# Patient Record
Sex: Female | Born: 1965 | Race: Black or African American | Hispanic: No | Smoking: Former smoker
Health system: Southern US, Community
[De-identification: ages and names within clinical notes are randomized; demographics above are authoritative.]

## PROBLEM LIST (undated history)

## (undated) DIAGNOSIS — Z720 Tobacco use: Secondary | ICD-10-CM

## (undated) DIAGNOSIS — I10 Essential (primary) hypertension: Secondary | ICD-10-CM

## (undated) DIAGNOSIS — E042 Nontoxic multinodular goiter: Secondary | ICD-10-CM

## (undated) DIAGNOSIS — E669 Obesity, unspecified: Secondary | ICD-10-CM

## (undated) DIAGNOSIS — F1011 Alcohol abuse, in remission: Secondary | ICD-10-CM

## (undated) DIAGNOSIS — S060X9A Concussion with loss of consciousness of unspecified duration, initial encounter: Secondary | ICD-10-CM

## (undated) DIAGNOSIS — F320 Major depressive disorder, single episode, mild: Secondary | ICD-10-CM

## (undated) DIAGNOSIS — S161XXD Strain of muscle, fascia and tendon at neck level, subsequent encounter: Secondary | ICD-10-CM

## (undated) DIAGNOSIS — F411 Generalized anxiety disorder: Secondary | ICD-10-CM

## (undated) DIAGNOSIS — A6000 Herpesviral infection of urogenital system, unspecified: Secondary | ICD-10-CM

## (undated) DIAGNOSIS — F319 Bipolar disorder, unspecified: Secondary | ICD-10-CM

## (undated) DIAGNOSIS — N644 Mastodynia: Secondary | ICD-10-CM

## (undated) DIAGNOSIS — K219 Gastro-esophageal reflux disease without esophagitis: Secondary | ICD-10-CM

## (undated) DIAGNOSIS — E785 Hyperlipidemia, unspecified: Secondary | ICD-10-CM

## (undated) DIAGNOSIS — D649 Anemia, unspecified: Secondary | ICD-10-CM

## (undated) DIAGNOSIS — F4310 Post-traumatic stress disorder, unspecified: Secondary | ICD-10-CM

## (undated) DIAGNOSIS — Z90710 Acquired absence of both cervix and uterus: Secondary | ICD-10-CM

## (undated) HISTORY — DX: Acquired absence of both cervix and uterus: Z90.710

## (undated) HISTORY — DX: Obesity, unspecified: E66.9

## (undated) HISTORY — DX: Mastodynia: N64.4

## (undated) HISTORY — DX: Gastro-esophageal reflux disease without esophagitis: K21.9

## (undated) HISTORY — DX: Strain of muscle, fascia and tendon at neck level, subsequent encounter: S16.1XXD

## (undated) HISTORY — DX: Herpesviral infection of urogenital system, unspecified: A60.00

## (undated) HISTORY — PX: WISDOM TOOTH EXTRACTION: SHX21

## (undated) HISTORY — DX: Nontoxic multinodular goiter: E04.2

## (undated) HISTORY — DX: Essential (primary) hypertension: I10

## (undated) HISTORY — DX: Major depressive disorder, single episode, mild: F32.0

## (undated) HISTORY — DX: Hyperlipidemia, unspecified: E78.5

## (undated) HISTORY — PX: CERVICAL CERCLAGE: SHX1329

## (undated) HISTORY — DX: Tobacco use: Z72.0

---

## 2003-04-29 ENCOUNTER — Emergency Department (HOSPITAL_COMMUNITY): Admission: EM | Admit: 2003-04-29 | Discharge: 2003-04-29 | Payer: Self-pay | Admitting: Emergency Medicine

## 2003-05-09 ENCOUNTER — Emergency Department (HOSPITAL_COMMUNITY): Admission: EM | Admit: 2003-05-09 | Discharge: 2003-05-10 | Payer: Self-pay | Admitting: Emergency Medicine

## 2003-10-08 ENCOUNTER — Inpatient Hospital Stay (HOSPITAL_COMMUNITY): Admission: AD | Admit: 2003-10-08 | Discharge: 2003-10-08 | Payer: Self-pay | Admitting: Family Medicine

## 2003-11-10 ENCOUNTER — Ambulatory Visit: Payer: Self-pay | Admitting: *Deleted

## 2003-11-10 ENCOUNTER — Ambulatory Visit (HOSPITAL_COMMUNITY): Admission: RE | Admit: 2003-11-10 | Discharge: 2003-11-10 | Payer: Self-pay | Admitting: *Deleted

## 2003-11-24 ENCOUNTER — Ambulatory Visit: Payer: Self-pay | Admitting: Obstetrics & Gynecology

## 2003-11-26 ENCOUNTER — Ambulatory Visit (HOSPITAL_COMMUNITY): Admission: RE | Admit: 2003-11-26 | Discharge: 2003-11-26 | Payer: Self-pay | Admitting: *Deleted

## 2003-12-15 ENCOUNTER — Ambulatory Visit: Payer: Self-pay | Admitting: Family Medicine

## 2003-12-15 ENCOUNTER — Ambulatory Visit (HOSPITAL_COMMUNITY): Admission: RE | Admit: 2003-12-15 | Discharge: 2003-12-15 | Payer: Self-pay | Admitting: *Deleted

## 2003-12-16 ENCOUNTER — Inpatient Hospital Stay (HOSPITAL_COMMUNITY): Admission: AD | Admit: 2003-12-16 | Discharge: 2003-12-18 | Payer: Self-pay | Admitting: Family Medicine

## 2003-12-16 ENCOUNTER — Ambulatory Visit: Payer: Self-pay | Admitting: Family Medicine

## 2003-12-16 ENCOUNTER — Inpatient Hospital Stay (HOSPITAL_COMMUNITY): Admission: AD | Admit: 2003-12-16 | Discharge: 2003-12-16 | Payer: Self-pay | Admitting: *Deleted

## 2003-12-23 ENCOUNTER — Inpatient Hospital Stay (HOSPITAL_COMMUNITY): Admission: AD | Admit: 2003-12-23 | Discharge: 2003-12-23 | Payer: Self-pay | Admitting: Obstetrics & Gynecology

## 2003-12-29 ENCOUNTER — Ambulatory Visit: Payer: Self-pay | Admitting: Family Medicine

## 2004-01-05 ENCOUNTER — Ambulatory Visit: Payer: Self-pay | Admitting: *Deleted

## 2004-01-12 ENCOUNTER — Ambulatory Visit: Payer: Self-pay | Admitting: *Deleted

## 2004-01-26 ENCOUNTER — Ambulatory Visit: Payer: Self-pay | Admitting: *Deleted

## 2004-02-09 ENCOUNTER — Ambulatory Visit: Payer: Self-pay | Admitting: Obstetrics & Gynecology

## 2004-02-17 ENCOUNTER — Inpatient Hospital Stay (HOSPITAL_COMMUNITY): Admission: AD | Admit: 2004-02-17 | Discharge: 2004-02-17 | Payer: Self-pay | Admitting: Obstetrics & Gynecology

## 2004-02-24 ENCOUNTER — Ambulatory Visit: Payer: Self-pay | Admitting: Family Medicine

## 2004-02-29 ENCOUNTER — Ambulatory Visit: Payer: Self-pay | Admitting: *Deleted

## 2004-02-29 ENCOUNTER — Ambulatory Visit (HOSPITAL_COMMUNITY): Admission: RE | Admit: 2004-02-29 | Discharge: 2004-02-29 | Payer: Self-pay | Admitting: *Deleted

## 2004-03-08 ENCOUNTER — Ambulatory Visit: Payer: Self-pay | Admitting: *Deleted

## 2004-03-15 ENCOUNTER — Ambulatory Visit: Payer: Self-pay | Admitting: *Deleted

## 2004-03-22 ENCOUNTER — Ambulatory Visit: Payer: Self-pay | Admitting: *Deleted

## 2004-03-22 ENCOUNTER — Ambulatory Visit (HOSPITAL_COMMUNITY): Admission: RE | Admit: 2004-03-22 | Discharge: 2004-03-22 | Payer: Self-pay | Admitting: *Deleted

## 2004-04-05 ENCOUNTER — Ambulatory Visit: Payer: Self-pay | Admitting: *Deleted

## 2004-04-19 ENCOUNTER — Ambulatory Visit: Payer: Self-pay | Admitting: *Deleted

## 2004-04-19 ENCOUNTER — Inpatient Hospital Stay (HOSPITAL_COMMUNITY): Admission: AD | Admit: 2004-04-19 | Discharge: 2004-04-19 | Payer: Self-pay | Admitting: Obstetrics and Gynecology

## 2004-04-20 ENCOUNTER — Inpatient Hospital Stay (HOSPITAL_COMMUNITY): Admission: AD | Admit: 2004-04-20 | Discharge: 2004-04-23 | Payer: Self-pay | Admitting: Obstetrics & Gynecology

## 2004-04-21 ENCOUNTER — Ambulatory Visit: Payer: Self-pay | Admitting: Family Medicine

## 2004-06-08 ENCOUNTER — Ambulatory Visit: Payer: Self-pay | Admitting: Family Medicine

## 2004-07-13 ENCOUNTER — Ambulatory Visit: Payer: Self-pay | Admitting: Family Medicine

## 2004-07-13 ENCOUNTER — Other Ambulatory Visit: Admission: RE | Admit: 2004-07-13 | Discharge: 2004-07-13 | Payer: Self-pay | Admitting: Family Medicine

## 2004-07-28 ENCOUNTER — Ambulatory Visit: Payer: Self-pay | Admitting: Family Medicine

## 2004-07-31 ENCOUNTER — Encounter: Admission: RE | Admit: 2004-07-31 | Discharge: 2004-07-31 | Payer: Self-pay | Admitting: Family Medicine

## 2004-08-01 ENCOUNTER — Ambulatory Visit: Payer: Self-pay | Admitting: Family Medicine

## 2004-08-30 ENCOUNTER — Ambulatory Visit: Payer: Self-pay | Admitting: Family Medicine

## 2004-09-07 ENCOUNTER — Ambulatory Visit: Payer: Self-pay | Admitting: Family Medicine

## 2004-09-26 ENCOUNTER — Ambulatory Visit: Payer: Self-pay | Admitting: Sports Medicine

## 2004-10-20 ENCOUNTER — Ambulatory Visit: Payer: Self-pay | Admitting: Family Medicine

## 2004-10-23 ENCOUNTER — Ambulatory Visit: Payer: Self-pay | Admitting: Family Medicine

## 2004-10-30 ENCOUNTER — Ambulatory Visit: Payer: Self-pay | Admitting: Family Medicine

## 2004-11-06 ENCOUNTER — Ambulatory Visit: Payer: Self-pay | Admitting: Family Medicine

## 2004-11-21 ENCOUNTER — Emergency Department (HOSPITAL_COMMUNITY): Admission: EM | Admit: 2004-11-21 | Discharge: 2004-11-21 | Payer: Self-pay | Admitting: Family Medicine

## 2004-12-11 ENCOUNTER — Ambulatory Visit: Payer: Self-pay | Admitting: Family Medicine

## 2005-01-26 ENCOUNTER — Ambulatory Visit: Payer: Self-pay | Admitting: Family Medicine

## 2005-03-06 ENCOUNTER — Ambulatory Visit: Payer: Self-pay | Admitting: Sports Medicine

## 2005-04-16 ENCOUNTER — Ambulatory Visit: Payer: Self-pay | Admitting: Family Medicine

## 2005-06-22 ENCOUNTER — Encounter (INDEPENDENT_AMBULATORY_CARE_PROVIDER_SITE_OTHER): Payer: Self-pay | Admitting: *Deleted

## 2005-06-22 LAB — CONVERTED CEMR LAB

## 2005-07-17 ENCOUNTER — Ambulatory Visit: Payer: Self-pay | Admitting: Family Medicine

## 2005-07-17 ENCOUNTER — Other Ambulatory Visit: Admission: RE | Admit: 2005-07-17 | Discharge: 2005-07-17 | Payer: Self-pay | Admitting: Family Medicine

## 2005-09-13 ENCOUNTER — Ambulatory Visit: Payer: Self-pay | Admitting: Family Medicine

## 2005-12-04 ENCOUNTER — Ambulatory Visit: Payer: Self-pay | Admitting: Family Medicine

## 2005-12-12 ENCOUNTER — Encounter: Admission: RE | Admit: 2005-12-12 | Discharge: 2005-12-12 | Payer: Self-pay | Admitting: Sports Medicine

## 2005-12-25 ENCOUNTER — Encounter: Admission: RE | Admit: 2005-12-25 | Discharge: 2005-12-25 | Payer: Self-pay | Admitting: Sports Medicine

## 2006-01-03 ENCOUNTER — Ambulatory Visit: Payer: Self-pay | Admitting: Family Medicine

## 2006-01-31 ENCOUNTER — Ambulatory Visit: Payer: Self-pay

## 2006-01-31 ENCOUNTER — Encounter: Admission: RE | Admit: 2006-01-31 | Discharge: 2006-05-01 | Payer: Self-pay | Admitting: Family Medicine

## 2006-03-21 DIAGNOSIS — M5382 Other specified dorsopathies, cervical region: Secondary | ICD-10-CM | POA: Insufficient documentation

## 2006-03-21 DIAGNOSIS — E669 Obesity, unspecified: Secondary | ICD-10-CM

## 2006-03-21 HISTORY — DX: Obesity, unspecified: E66.9

## 2006-03-22 ENCOUNTER — Encounter (INDEPENDENT_AMBULATORY_CARE_PROVIDER_SITE_OTHER): Payer: Self-pay | Admitting: *Deleted

## 2006-04-11 ENCOUNTER — Telehealth: Payer: Self-pay | Admitting: *Deleted

## 2006-05-03 ENCOUNTER — Ambulatory Visit: Payer: Self-pay | Admitting: Family Medicine

## 2006-05-03 ENCOUNTER — Telehealth: Payer: Self-pay | Admitting: *Deleted

## 2006-05-03 ENCOUNTER — Encounter (INDEPENDENT_AMBULATORY_CARE_PROVIDER_SITE_OTHER): Payer: Self-pay | Admitting: Family Medicine

## 2006-05-03 ENCOUNTER — Other Ambulatory Visit: Admission: RE | Admit: 2006-05-03 | Discharge: 2006-05-03 | Payer: Self-pay | Admitting: Family Medicine

## 2006-05-03 LAB — CONVERTED CEMR LAB
Chlamydia, DNA Probe: NEGATIVE
GC Probe Amp, Genital: NEGATIVE

## 2006-05-06 ENCOUNTER — Encounter (INDEPENDENT_AMBULATORY_CARE_PROVIDER_SITE_OTHER): Payer: Self-pay | Admitting: Family Medicine

## 2006-05-08 ENCOUNTER — Encounter (INDEPENDENT_AMBULATORY_CARE_PROVIDER_SITE_OTHER): Payer: Self-pay | Admitting: Family Medicine

## 2006-05-29 ENCOUNTER — Ambulatory Visit: Payer: Self-pay | Admitting: Family Medicine

## 2006-06-05 ENCOUNTER — Telehealth: Payer: Self-pay | Admitting: *Deleted

## 2006-06-05 ENCOUNTER — Ambulatory Visit: Payer: Self-pay | Admitting: Family Medicine

## 2006-06-21 ENCOUNTER — Ambulatory Visit: Payer: Self-pay | Admitting: Family Medicine

## 2006-06-24 ENCOUNTER — Telehealth (INDEPENDENT_AMBULATORY_CARE_PROVIDER_SITE_OTHER): Payer: Self-pay | Admitting: Family Medicine

## 2006-06-24 ENCOUNTER — Encounter (INDEPENDENT_AMBULATORY_CARE_PROVIDER_SITE_OTHER): Payer: Self-pay | Admitting: Family Medicine

## 2006-07-12 ENCOUNTER — Ambulatory Visit: Payer: Self-pay | Admitting: Family Medicine

## 2006-07-15 ENCOUNTER — Ambulatory Visit: Payer: Self-pay | Admitting: Family Medicine

## 2006-09-10 ENCOUNTER — Encounter (INDEPENDENT_AMBULATORY_CARE_PROVIDER_SITE_OTHER): Payer: Self-pay | Admitting: Family Medicine

## 2006-09-10 ENCOUNTER — Ambulatory Visit: Payer: Self-pay | Admitting: Family Medicine

## 2006-09-10 LAB — CONVERTED CEMR LAB
GC Probe Amp, Genital: NEGATIVE
Whiff Test: POSITIVE

## 2006-09-11 ENCOUNTER — Encounter (INDEPENDENT_AMBULATORY_CARE_PROVIDER_SITE_OTHER): Payer: Self-pay | Admitting: Family Medicine

## 2006-10-30 ENCOUNTER — Ambulatory Visit: Payer: Self-pay | Admitting: Family Medicine

## 2006-10-30 ENCOUNTER — Telehealth (INDEPENDENT_AMBULATORY_CARE_PROVIDER_SITE_OTHER): Payer: Self-pay | Admitting: *Deleted

## 2006-11-25 ENCOUNTER — Encounter (INDEPENDENT_AMBULATORY_CARE_PROVIDER_SITE_OTHER): Payer: Self-pay | Admitting: Family Medicine

## 2007-01-21 ENCOUNTER — Telehealth (INDEPENDENT_AMBULATORY_CARE_PROVIDER_SITE_OTHER): Payer: Self-pay | Admitting: *Deleted

## 2007-01-25 ENCOUNTER — Emergency Department (HOSPITAL_COMMUNITY): Admission: EM | Admit: 2007-01-25 | Discharge: 2007-01-26 | Payer: Self-pay | Admitting: Emergency Medicine

## 2007-01-27 ENCOUNTER — Telehealth (INDEPENDENT_AMBULATORY_CARE_PROVIDER_SITE_OTHER): Payer: Self-pay | Admitting: Family Medicine

## 2007-01-30 ENCOUNTER — Ambulatory Visit: Payer: Self-pay

## 2007-01-30 ENCOUNTER — Encounter (INDEPENDENT_AMBULATORY_CARE_PROVIDER_SITE_OTHER): Payer: Self-pay | Admitting: Family Medicine

## 2007-01-30 LAB — CONVERTED CEMR LAB
Beta hcg, urine, semiquantitative: NEGATIVE
Hemoglobin: 12.6 g/dL (ref 12.0–15.0)
LH: 12.4 milliintl units/mL
MCHC: 32.4 g/dL (ref 30.0–36.0)
MCV: 81.9 fL (ref 78.0–100.0)
RBC: 4.75 M/uL (ref 3.87–5.11)
RDW: 14.1 % (ref 11.5–15.5)
TSH: 2.229 microintl units/mL (ref 0.350–5.50)

## 2007-02-06 ENCOUNTER — Telehealth: Payer: Self-pay | Admitting: *Deleted

## 2007-02-18 ENCOUNTER — Encounter (INDEPENDENT_AMBULATORY_CARE_PROVIDER_SITE_OTHER): Payer: Self-pay | Admitting: Family Medicine

## 2007-03-04 ENCOUNTER — Telehealth (INDEPENDENT_AMBULATORY_CARE_PROVIDER_SITE_OTHER): Payer: Self-pay | Admitting: Family Medicine

## 2007-03-07 ENCOUNTER — Telehealth: Payer: Self-pay | Admitting: *Deleted

## 2007-03-18 ENCOUNTER — Ambulatory Visit: Payer: Self-pay | Admitting: Family Medicine

## 2007-03-19 ENCOUNTER — Encounter: Admission: RE | Admit: 2007-03-19 | Discharge: 2007-03-19 | Payer: Self-pay | Admitting: Family Medicine

## 2007-03-19 ENCOUNTER — Telehealth: Payer: Self-pay | Admitting: *Deleted

## 2007-03-25 ENCOUNTER — Encounter: Payer: Self-pay | Admitting: Family Medicine

## 2007-03-31 ENCOUNTER — Ambulatory Visit: Payer: Self-pay | Admitting: Sports Medicine

## 2007-03-31 ENCOUNTER — Encounter (INDEPENDENT_AMBULATORY_CARE_PROVIDER_SITE_OTHER): Payer: Self-pay | Admitting: *Deleted

## 2007-04-08 ENCOUNTER — Encounter (INDEPENDENT_AMBULATORY_CARE_PROVIDER_SITE_OTHER): Payer: Self-pay | Admitting: *Deleted

## 2007-05-25 ENCOUNTER — Emergency Department (HOSPITAL_COMMUNITY): Admission: EM | Admit: 2007-05-25 | Discharge: 2007-05-25 | Payer: Self-pay | Admitting: Emergency Medicine

## 2007-05-25 ENCOUNTER — Telehealth: Payer: Self-pay | Admitting: Family Medicine

## 2007-05-26 ENCOUNTER — Ambulatory Visit: Payer: Self-pay | Admitting: Family Medicine

## 2007-05-26 ENCOUNTER — Encounter (INDEPENDENT_AMBULATORY_CARE_PROVIDER_SITE_OTHER): Payer: Self-pay | Admitting: Family Medicine

## 2007-05-26 LAB — CONVERTED CEMR LAB
CO2: 21 meq/L (ref 19–32)
Calcium: 8.9 mg/dL (ref 8.4–10.5)
Creatinine, Ser: 1.15 mg/dL (ref 0.40–1.20)
Glucose, Bld: 101 mg/dL — ABNORMAL HIGH (ref 70–99)

## 2007-05-27 ENCOUNTER — Encounter (INDEPENDENT_AMBULATORY_CARE_PROVIDER_SITE_OTHER): Payer: Self-pay | Admitting: Family Medicine

## 2007-05-27 ENCOUNTER — Telehealth (INDEPENDENT_AMBULATORY_CARE_PROVIDER_SITE_OTHER): Payer: Self-pay | Admitting: *Deleted

## 2007-06-17 ENCOUNTER — Emergency Department (HOSPITAL_COMMUNITY): Admission: EM | Admit: 2007-06-17 | Discharge: 2007-06-17 | Payer: Self-pay | Admitting: Family Medicine

## 2007-07-18 ENCOUNTER — Encounter (INDEPENDENT_AMBULATORY_CARE_PROVIDER_SITE_OTHER): Payer: Self-pay | Admitting: Family Medicine

## 2007-07-21 ENCOUNTER — Telehealth (INDEPENDENT_AMBULATORY_CARE_PROVIDER_SITE_OTHER): Payer: Self-pay | Admitting: Family Medicine

## 2007-07-24 ENCOUNTER — Telehealth (INDEPENDENT_AMBULATORY_CARE_PROVIDER_SITE_OTHER): Payer: Self-pay | Admitting: Family Medicine

## 2007-07-29 ENCOUNTER — Encounter: Payer: Self-pay | Admitting: Endocrinology

## 2007-08-04 ENCOUNTER — Encounter: Payer: Self-pay | Admitting: Endocrinology

## 2007-08-05 ENCOUNTER — Ambulatory Visit: Payer: Self-pay | Admitting: Endocrinology

## 2007-09-04 ENCOUNTER — Encounter: Payer: Self-pay | Admitting: Endocrinology

## 2007-09-04 ENCOUNTER — Other Ambulatory Visit: Admission: RE | Admit: 2007-09-04 | Discharge: 2007-09-04 | Payer: Self-pay | Admitting: Diagnostic Radiology

## 2007-09-04 ENCOUNTER — Encounter (INDEPENDENT_AMBULATORY_CARE_PROVIDER_SITE_OTHER): Payer: Self-pay | Admitting: Diagnostic Radiology

## 2007-09-04 ENCOUNTER — Encounter: Admission: RE | Admit: 2007-09-04 | Discharge: 2007-09-04 | Payer: Self-pay | Admitting: Endocrinology

## 2007-09-05 DIAGNOSIS — E042 Nontoxic multinodular goiter: Secondary | ICD-10-CM

## 2007-09-05 HISTORY — DX: Nontoxic multinodular goiter: E04.2

## 2007-09-25 ENCOUNTER — Ambulatory Visit: Payer: Self-pay | Admitting: Endocrinology

## 2007-12-10 ENCOUNTER — Ambulatory Visit: Payer: Self-pay | Admitting: Family Medicine

## 2007-12-10 ENCOUNTER — Telehealth: Payer: Self-pay | Admitting: *Deleted

## 2007-12-10 ENCOUNTER — Encounter: Payer: Self-pay | Admitting: Family Medicine

## 2007-12-12 ENCOUNTER — Telehealth: Payer: Self-pay | Admitting: *Deleted

## 2008-06-14 ENCOUNTER — Encounter (INDEPENDENT_AMBULATORY_CARE_PROVIDER_SITE_OTHER): Payer: Self-pay | Admitting: Family Medicine

## 2008-08-11 ENCOUNTER — Encounter: Payer: Self-pay | Admitting: Family Medicine

## 2008-08-11 ENCOUNTER — Ambulatory Visit: Payer: Self-pay | Admitting: Family Medicine

## 2008-08-11 DIAGNOSIS — Z9189 Other specified personal risk factors, not elsewhere classified: Secondary | ICD-10-CM | POA: Insufficient documentation

## 2008-08-23 ENCOUNTER — Ambulatory Visit: Payer: Self-pay | Admitting: Family Medicine

## 2008-08-23 ENCOUNTER — Encounter: Payer: Self-pay | Admitting: Family Medicine

## 2008-08-27 ENCOUNTER — Encounter: Payer: Self-pay | Admitting: Family Medicine

## 2008-08-27 LAB — CONVERTED CEMR LAB
Albumin: 3.8 g/dL (ref 3.5–5.2)
BUN: 10 mg/dL (ref 6–23)
Calcium: 9 mg/dL (ref 8.4–10.5)
Chloride: 105 meq/L (ref 96–112)
Cholesterol: 168 mg/dL (ref 0–200)
Glucose, Bld: 86 mg/dL (ref 70–99)
HDL: 39 mg/dL — ABNORMAL LOW (ref >39)
Potassium: 4.5 meq/L (ref 3.5–5.3)
Triglycerides: 90 mg/dL (ref <150)

## 2008-09-24 ENCOUNTER — Emergency Department (HOSPITAL_COMMUNITY): Admission: EM | Admit: 2008-09-24 | Discharge: 2008-09-24 | Payer: Self-pay | Admitting: Family Medicine

## 2008-09-24 ENCOUNTER — Emergency Department (HOSPITAL_COMMUNITY): Admission: EM | Admit: 2008-09-24 | Discharge: 2008-09-24 | Payer: Self-pay | Admitting: Emergency Medicine

## 2008-11-15 ENCOUNTER — Telehealth: Payer: Self-pay | Admitting: Family Medicine

## 2008-11-20 ENCOUNTER — Emergency Department (HOSPITAL_COMMUNITY): Admission: EM | Admit: 2008-11-20 | Discharge: 2008-11-20 | Payer: Self-pay | Admitting: Emergency Medicine

## 2008-11-25 ENCOUNTER — Encounter: Payer: Self-pay | Admitting: Family Medicine

## 2008-11-26 ENCOUNTER — Ambulatory Visit: Payer: Self-pay | Admitting: Family Medicine

## 2008-11-26 ENCOUNTER — Encounter: Payer: Self-pay | Admitting: Family Medicine

## 2008-11-26 LAB — CONVERTED CEMR LAB
Bilirubin Urine: NEGATIVE
Chlamydia, DNA Probe: NEGATIVE
Ketones, urine, test strip: NEGATIVE
Protein, U semiquant: NEGATIVE
Urobilinogen, UA: 0.2

## 2009-05-12 ENCOUNTER — Telehealth: Payer: Self-pay | Admitting: Family Medicine

## 2009-06-29 ENCOUNTER — Ambulatory Visit: Payer: Self-pay | Admitting: Family Medicine

## 2009-06-29 ENCOUNTER — Other Ambulatory Visit: Admission: RE | Admit: 2009-06-29 | Discharge: 2009-06-29 | Payer: Self-pay | Admitting: Family Medicine

## 2009-06-29 ENCOUNTER — Encounter: Payer: Self-pay | Admitting: Family Medicine

## 2009-06-29 LAB — CONVERTED CEMR LAB: Whiff Test: POSITIVE

## 2009-06-30 LAB — CONVERTED CEMR LAB: Hepatitis B Surface Ag: NEGATIVE

## 2009-07-01 ENCOUNTER — Ambulatory Visit (HOSPITAL_COMMUNITY): Admission: RE | Admit: 2009-07-01 | Discharge: 2009-07-01 | Payer: Self-pay | Admitting: Family Medicine

## 2009-07-01 ENCOUNTER — Telehealth: Payer: Self-pay | Admitting: *Deleted

## 2009-07-01 LAB — CONVERTED CEMR LAB: Pap Smear: NEGATIVE

## 2010-02-21 NOTE — Progress Notes (Signed)
Summary: Rx Req: Acyclovir Rx  Phone Note Call from Patient Call back at [phone]   Caller: Patient Summary of Call: Pt wants the 4.00 plan at [facility] Acyclovir 200 mg capsules.  [facility] [address] in [place].  Pt uses to have Medicaid and now does not.   Initial call taken by: Clydell Hakim,  May 12, 2009 4:23 PM    New/Updated Medications: ACYCLOVIR 200 MG CAPS (ACYCLOVIR) take 4 pills (800 mg) at one time every 12 hours for 5 days. Prescriptions: ACYCLOVIR 200 MG CAPS (ACYCLOVIR) take 4 pills (800 mg) at one time every 12 hours for 5 days.  #40 x 4   Entered and Authorized by:   Jamie Brookes MD   Signed by:   Jamie Brookes MD on 05/12/2009   Method used:   Electronically to        [facility]. [phone]* (retail)       [address]       [place], [place]  [address]       Ph: [phone]       Fax: [phone]   RxID:   [id number]
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
## 2010-02-21 NOTE — Progress Notes (Signed)
Summary: results  Phone Note Call from Patient Call back at Home Phone [phone]   Caller: Patient Summary of Call: pt is wanting copy of [place] results w/ comparisons from last time  Initial call taken by: De Nurse,  July 01, 2009 2:35 PM  Follow-up for Phone Call        no problem, you can print out the report and it has a written comparison in it so she is free to pick it up anything.  Follow-up by: Jamie Brookes MD,  July 01, 2009 2:44 PM  Additional Follow-up for Phone Call Additional follow up Details #1::        Results placed up front and pt informed. Additional Follow-up by: Jone Baseman CMA,  July 01, 2009 3:38 PM

## 2010-04-27 LAB — URINE MICROSCOPIC-ADD ON

## 2010-04-27 LAB — URINALYSIS, ROUTINE W REFLEX MICROSCOPIC
Ketones, ur: NEGATIVE mg/dL
Nitrite: NEGATIVE
Protein, ur: NEGATIVE mg/dL
Urobilinogen, UA: 1 mg/dL (ref 0.0–1.0)
pH: 6 (ref 5.0–8.0)

## 2010-06-09 NOTE — Op Note (Signed)
NAME:  Tracy Rose, BOYUM NO.:  [id number]   MEDICAL RECORD NO.:  [id number]          PATIENT TYPE:  WOC   LOCATION:  WOC                          FACILITY:  [facility]   PHYSICIAN:  Lesly Dukes, M.D. DATE OF BIRTH:  Nov 17, 1965   DATE OF PROCEDURE:  12/17/2003  DATE OF DISCHARGE:                                 OPERATIVE REPORT   PREOPERATIVE DIAGNOSIS:  A 45 year old para 0-1-0-0 at 18 weeks and three  days estimated gestational age with history of cervical incompetence with  last pregnancy and current cervical exam is 2 cm with funneling.   POSTOPERATIVE DIAGNOSIS:  A 45 year old para 0-1-0-0 at 18 weeks and three  days estimated gestational age with history of cervical incompetence with  last pregnancy and current cervical exam is 2 cm with funneling.   OPERATION PERFORMED:  [employer] cerclage.   SURGEON:  Lesly Dukes, M.D.   ANESTHESIA:  Spinal.   ESTIMATED BLOOD LOSS:  100 mL.   COMPLICATIONS:  None.   FINDINGS:  A 2 cm cervix and closed prior to and after procedure.   DESCRIPTION OF PROCEDURE:  After informed consent was obtained, the patient  was taken to the operating room where spinal anesthesia was found to be  adequate.  The patient was placed in dorsal lithotomy position and prepped  and draped in the normal sterile fashion.  A Foley catheter was placed in  the bladder.  Retractors were placed in the vagina and the cervix was  brought into view.  The cervix was grasped with sponge stick and a 0 Prolene  suture was used to tie the cervix shut in a circumferential fashion.  The  knot was tied at 12 o'clock.  There is a tag on the knot to aid in  visualization.  There was good hemostasis after procedure.  Digital exam  after the procedure revealed a 2 cm closed cervix.  Foley catheter was left  in the bladder and the patient was taken to the recovery room in stable  condition.  All counts were correct.      KHL/MEDQ  D:  12/17/2003   T:  12/17/2003  Job:  [id number]

## 2010-06-09 NOTE — Discharge Summary (Signed)
Tracy Rose, JIMERSON NO.:  [id number]   MEDICAL RECORD NO.:  [id number]          PATIENT TYPE:  WOC   LOCATION:  WOC                          FACILITY:  [facility]   PHYSICIAN:  Jon Gills, M.D.  DATE OF BIRTH:  Sep 21, 1965   DATE OF ADMISSION:  12/16/2003  DATE OF DISCHARGE:  12/18/2003                                 DISCHARGE SUMMARY   ATTENDING PHYSICIAN:  Tinnie Gens, M.D.   ADMITTING DIAGNOSES:  70.  A 45 year old G2, P0-1-0-0 at 76 and 4 weeks with cervical incompetence.  2.  History of 20 week preterm delivery with painless dilation.  3.  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  4.  Positive bacterial vaginosis on wet prep.   DISCHARGE DIAGNOSES:  95.  A 44 year old G2, P0-1-0-0 at 8 and 5 weeks, status post cerclage.  2.  Positive fetal heart rate by Doppler.  3.  Treated bacterial vaginosis.   DISCHARGE MEDICATIONS:  1.  Tylenol 650 mg q.4h. p.r.n.  2.  Prenatal vitamin one p.o. daily.   ADMISSION HISTORY:  Ms. Cutsforth was seen at [facility] at 19 and 3  weeks with cervical effacement of 50% and a closed cervix.  On serial  ultrasounds her cervix gradually shortened from 3.5 and 3 cm to 2.3 cm.  With her history of delivery at 20 weeks, the patient was decided that a  cerclage was necessary.  At that time, the patient refused to be admitted  but agreed to be admitted 24 hours following the visit.   The patient was admitted at 18 and 4 weeks and the cerclage was placed at  that time.  The patient had no complications overnight and on the day  following hospitalization, was stable for discharge.  Her cervix exam on the  day of discharge was closed and felt about 2-3 cm in length.  The cerclage  was well in place.   CONDITION ON DISCHARGE:  The patient was discharged to home in stable  condition.   INSTRUCTIONS TO PATIENT:  The patient was told of the above medical regimen.  She should follow up with [facility].  She is  given preterm labor  precautions.     LC/MEDQ  D:  12/18/2003  T:  12/18/2003  Job:  [id number]   cc:   [facility]
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
## 2010-06-09 NOTE — Discharge Summary (Signed)
NAME:  Tracy Rose, Tracy Rose NO.:  [id number]   MEDICAL RECORD NO.:  [id number]           PATIENT TYPE:   LOCATION:                                 FACILITY:   PHYSICIAN:  Jon Gills, M.D.       DATE OF BIRTH:   DATE OF ADMISSION:  DATE OF DISCHARGE:                                 DISCHARGE SUMMARY   ADDENDUM   The patient was placed on Indocin and Unasyn on admission.  The Unasyn was  then changed to ampicillin and Flagyl IV for the bacterial vaginosis.  Bacterial vaginosis has been properly treated during her hospitalization.            ___________________________________________  Jon Gills, M.D.    LC/MEDQ  D:  12/18/2003  T:  12/18/2003  Job:  [id number]

## 2010-09-08 ENCOUNTER — Ambulatory Visit (INDEPENDENT_AMBULATORY_CARE_PROVIDER_SITE_OTHER): Payer: Self-pay | Admitting: Family Medicine

## 2010-09-08 ENCOUNTER — Encounter: Payer: Self-pay | Admitting: Family Medicine

## 2010-09-08 VITALS — BP 109/85 | HR 97 | Temp 97.9°F | Ht 65.75 in | Wt 253.1 lb

## 2010-09-08 DIAGNOSIS — Z720 Tobacco use: Secondary | ICD-10-CM | POA: Insufficient documentation

## 2010-09-08 DIAGNOSIS — Z01419 Encounter for gynecological examination (general) (routine) without abnormal findings: Secondary | ICD-10-CM

## 2010-09-08 DIAGNOSIS — F17200 Nicotine dependence, unspecified, uncomplicated: Secondary | ICD-10-CM

## 2010-09-08 DIAGNOSIS — E042 Nontoxic multinodular goiter: Secondary | ICD-10-CM

## 2010-09-08 DIAGNOSIS — E669 Obesity, unspecified: Secondary | ICD-10-CM

## 2010-09-08 HISTORY — DX: Tobacco use: Z72.0

## 2010-09-08 NOTE — Progress Notes (Signed)
Addended by: Macy Mis on: 09/08/2010 12:51 PM   Modules accepted: Orders

## 2010-09-08 NOTE — Assessment & Plan Note (Addendum)
Last [place] June 2011 shows no significant change.  I am unsure of optimal surveillance.  Patient to follow-up with her endocrinologist.

## 2010-09-08 NOTE — Progress Notes (Signed)
  Subjective:    Patient ID: Tracy Rose, female    DOB: 1965/04/19, 45 y.o.   MRN: [id number]  HPI Here for annual physical. No complaints  Hx of multinodular goiter.  Has not had endocrine follow-up in several years.  Has ultrasound last year with PCP showing so significant change.  Annual Gynecological Exam   Wt Readings from Last 3 Encounters:  09/08/10 253 lb 1.6 oz (114.805 kg)  06/29/09 237 lb 9.6 oz (107.775 kg)  11/26/08 221 lb (100.245 kg)   Last period: 09/03/2010 Regular periods: yes Heavy bleeding: no  Sexually active: no Birth control or hormonal therapy:no Hx of STD: Patient desires STD screening Dyspareunia: No Hot flashes: No Vaginal discharge: no Dysuria:No   Last mammogram: never Breast mass or concerns: No Last Pap: last year  History of abnormal pap: No   FH of breast, uterine, ovarian, colon cancer: yes    Review of Systems    Gen:  No fever, chills, unexplained weight loss Ears:  No hearing loss, ringing Eyes: No vision changes, double vision, eye drainage Nose:  No rhinorrhea, congestion Throat:  No sore throat or dysphagia CV:  No chest pain, palpitations, PND, dyspnea on exertion, or edema Resp: No cough, dyspnea, wheezing Abd: No nausea, vomting, diarrhea, constipation, or change in bowel color, size, or caliber. MSK: no joint pain, myalgias SKIN: no rash, changing moles GU: No dysuria, hematuria, vaginal discharge Neuro:  No headache, numbness, weakness, tingling, syncope.   Objective:   Physical Exam  GEN: Alert & Oriented, No acute distress CV:  Regular Rate & Rhythm, no murmur Neck:  No nodules appreciated.  Thyroid mildly enlarged. Respiratory:  Normal work of breathing, CTAB Abd:  + BS, soft, no tenderness to palpation Ext: no pre-tibial edema Breasts: pendulous, no masses, skin changes. Pelvic Exam:        External: normal female genitalia without lesions or masses        Vagina: normal without lesions or masses       Cervix: normal without lesions or masses        Adnexa: normal bimanual exam without masses or fullness        Uterus: normal by palpation           Assessment & Plan:

## 2010-09-08 NOTE — Assessment & Plan Note (Signed)
Gave info on quit line, and availability of further smoking cessation counseling and discussion on available therapies.

## 2010-09-08 NOTE — Patient Instructions (Addendum)
1-800-quit now Make appointment for smoking cessation with Dr. Raymondo Band.  Dr. Everardo All [address] [place], [place] [address] Location Phone: [phone]

## 2010-09-08 NOTE — Assessment & Plan Note (Signed)
Gave her info to start exercise, discuss dietary changes when we addressed them with her 45 year old.  Invited her to follow-up to discuss nutrition interventions in further detail.

## 2010-09-14 ENCOUNTER — Encounter: Payer: Self-pay | Admitting: Family Medicine

## 2011-01-25 ENCOUNTER — Encounter: Payer: Self-pay | Admitting: Family Medicine

## 2011-01-25 ENCOUNTER — Ambulatory Visit (INDEPENDENT_AMBULATORY_CARE_PROVIDER_SITE_OTHER): Payer: Medicaid Other | Admitting: Family Medicine

## 2011-01-25 VITALS — BP 121/84 | HR 80 | Temp 97.6°F | Ht 66.0 in | Wt 272.0 lb

## 2011-01-25 DIAGNOSIS — E669 Obesity, unspecified: Secondary | ICD-10-CM

## 2011-01-25 DIAGNOSIS — R232 Flushing: Secondary | ICD-10-CM | POA: Insufficient documentation

## 2011-01-25 DIAGNOSIS — Z720 Tobacco use: Secondary | ICD-10-CM

## 2011-01-25 DIAGNOSIS — F17200 Nicotine dependence, unspecified, uncomplicated: Secondary | ICD-10-CM

## 2011-01-25 DIAGNOSIS — E042 Nontoxic multinodular goiter: Secondary | ICD-10-CM

## 2011-01-25 LAB — CBC
HCT: 39.3 % (ref 36.0–46.0)
Hemoglobin: 12.4 g/dL (ref 12.0–15.0)
MCV: 85.1 fL (ref 78.0–100.0)
RBC: 4.62 MIL/uL (ref 3.87–5.11)
WBC: 7.6 10*3/uL (ref 4.0–10.5)

## 2011-01-25 NOTE — Assessment & Plan Note (Signed)
Quit "cold [place]" in November 23rd

## 2011-01-25 NOTE — Assessment & Plan Note (Addendum)
No evidence of syndromic or medication cause.  Possible very early menopause but would be less likely given no other irregularities in menses.  Will check CBC, FSH, TSH today.    Advised daily exercise, weight management, dressing in layers.  Advised her to follow-up with endocrine for history of Multinodular goiter?

## 2011-01-25 NOTE — Patient Instructions (Addendum)
Hot flashes: will check your thyroid.  Probably not due menopause  Use daily exercise, weight loss, dressing in layers to help  Make appt for follow-up with your PCP  Bring food diary to discuss weight with yoru doctor- come fasting and you will be able to get fasting bloodwork

## 2011-01-25 NOTE — Assessment & Plan Note (Addendum)
Patient agreeable to following up with endocrine and reports no barriers to doing so.

## 2011-01-25 NOTE — Assessment & Plan Note (Signed)
Gave info on starting exercise and diet history.  Asked patient to keep 4 day food diary and bring to follow-up appt with PCP.  20 pound weigh gain in past 4 months, BMI >40

## 2011-01-25 NOTE — Progress Notes (Signed)
  Subjective:    Patient ID: Tracy Rose, female    DOB: 1965-09-13, 46 y.o.   MRN: [id number]  HPI  Same day appt to evaluate hot flashes  Several weeks of hot flashes, increased moodiness, weight gain (20 pounds in past 4 months).  Any time of day, occurs at rest, lasts  3-4 minutes.  Worse when in a hot room.  Better with fans.    Regular periods, worse menstrual cramping, heavier than typical, 3-4 days long, on schedule- monthly.  Increased fatigue.  Sleeps 9pm-4a every day.  Feels rested in the morning, gets tired by afternoon.  Denies anhedonia, crying, anxiety. Quit smoking Nov 23rd.  Review of SystemsGen:  No fever, chills, unexplained weight loss Eyes: No vision changes, double vision, eye drainage Throat:  No sore throat or dysphagia CV:  No chest pain, palpitations, PND, dyspnea on exertion, or edema Resp: No cough, dyspnea, wheezing Abd: No nausea, vomting, diarrhea, constipation, or change in bowel color, size, or caliber. GU: No dysuria, hematuria, vaginal discharge Neuro:  No headache, numbness, weakness, tingling, syncope.       Objective:   Physical Exam GEN: Alert & Oriented, No acute distress CV:  Regular Rate & Rhythm, no murmur Respiratory:  Normal work of breathing, CTAB Abd:  + BS, soft, no tenderness to palpation Ext: no pre-tibial edema        Assessment & Plan:

## 2011-01-26 ENCOUNTER — Encounter: Payer: Self-pay | Admitting: Family Medicine

## 2011-02-23 ENCOUNTER — Other Ambulatory Visit: Payer: Medicaid Other

## 2011-09-13 ENCOUNTER — Encounter: Payer: Self-pay | Admitting: Family Medicine

## 2011-11-14 ENCOUNTER — Encounter: Payer: Self-pay | Admitting: *Deleted

## 2011-12-12 ENCOUNTER — Ambulatory Visit (INDEPENDENT_AMBULATORY_CARE_PROVIDER_SITE_OTHER): Payer: Self-pay | Admitting: Family Medicine

## 2011-12-12 ENCOUNTER — Encounter: Payer: Self-pay | Admitting: Family Medicine

## 2011-12-12 VITALS — BP 147/102 | HR 78 | Temp 98.1°F | Ht 65.25 in | Wt 282.3 lb

## 2011-12-12 DIAGNOSIS — A6000 Herpesviral infection of urogenital system, unspecified: Secondary | ICD-10-CM | POA: Insufficient documentation

## 2011-12-12 HISTORY — DX: Herpesviral infection of urogenital system, unspecified: A60.00

## 2011-12-12 MED ORDER — ACYCLOVIR 200 MG PO CAPS: 800.0000 mg | ORAL_CAPSULE | Freq: Two times a day (BID) | ORAL | Status: DC

## 2011-12-12 NOTE — Progress Notes (Signed)
  Subjective:    Patient ID: Tracy Rose, female    DOB: 1965/06/04, 46 y.o.   MRN: [id number]  HPI  Tracy Rose comes in for a recurrent herpes outbreak.  She says it started 1-2 days ago, with genital itching, burning pain.  Denies fevers/chills/superinfection.  She says her periods started this morning so she does not want to do a pelvic exam.  She says she has not been sexually active in years.  She has taken both acyclovir and valacyclovir for this in the past, both have helped.  She currently has no insurance.   Review of Systems Pertinent items in HPI    Objective:   Physical Exam BP 147/102  Pulse 78  Temp 98.1 F (36.7 C) (Oral)  Ht 5' 5.25" (1.657 m)  Wt 282 lb 4.8 oz (128.05 kg)  BMI 46.62 kg/m2  LMP 12/12/2011 General appearance: alert, cooperative and no distress Patient refuses pelvic exam.        Assessment & Plan:

## 2011-12-12 NOTE — Assessment & Plan Note (Signed)
Rx for acyclovir as she does not currently have insurance.  Instructed to follow up if does not improve with treatment.

## 2011-12-12 NOTE — Patient Instructions (Signed)
Genital Herpes  Genital herpes is a sexually transmitted disease. This means that it is a disease passed by having sex with an infected person. There is no cure for genital herpes. The time between attacks can be months to years. The virus may live in a person but produce no problems (symptoms). This infection can be passed to a baby as it travels down the birth canal (vagina). In a newborn, this can cause central nervous system damage, eye damage, or even death. The virus that causes genital herpes is usually HSV-2 virus. The virus that causes oral herpes is usually HSV-1. The diagnosis (learning what is wrong) is made through culture results.  SYMPTOMS   Usually symptoms of pain and itching begin a few days to a week after contact. It first appears as small blisters that progress to small painful ulcers which then scab over and heal after several days. It affects the outer genitalia, birth canal, cervix, penis, anal area, buttocks, and thighs.  HOME CARE INSTRUCTIONS    Keep ulcerated areas dry and clean.   Take medications as directed. Antiviral medications can speed up healing. They will not prevent recurrences or cure this infection. These medications can also be taken for suppression if there are frequent recurrences.   While the infection is active, it is contagious. Avoid all sexual contact during active infections.   Condoms may help prevent spread of the herpes virus.   Practice safe sex.   Wash your hands thoroughly after touching the genital area.   Avoid touching your eyes after touching your genital area.   Inform your caregiver if you have had genital herpes and become pregnant. It is your responsibility to insure a safe outcome for your baby in this pregnancy.   Only take over-the-counter or prescription medicines for pain, discomfort, or fever as directed by your caregiver.  SEEK MEDICAL CARE IF:    You have a recurrence of this infection.   You do not respond to medications and are not  improving.   You have new sources of pain or discharge which have changed from the original infection.   You have an oral temperature above 102 F (38.9 C).   You develop abdominal pain.   You develop eye pain or signs of eye infection.  Document Released: 01/06/2000 Document Revised: 04/02/2011 Document Reviewed: 01/26/2009  [employer] Patient Information 2013 [employer].

## 2011-12-28 ENCOUNTER — Ambulatory Visit (INDEPENDENT_AMBULATORY_CARE_PROVIDER_SITE_OTHER): Payer: Self-pay | Admitting: Family Medicine

## 2011-12-28 ENCOUNTER — Encounter: Payer: Self-pay | Admitting: Family Medicine

## 2011-12-28 VITALS — BP 139/90 | HR 94 | Temp 100.2°F | Ht 65.75 in | Wt 279.5 lb

## 2011-12-28 DIAGNOSIS — J020 Streptococcal pharyngitis: Secondary | ICD-10-CM | POA: Insufficient documentation

## 2011-12-28 DIAGNOSIS — J029 Acute pharyngitis, unspecified: Secondary | ICD-10-CM

## 2011-12-28 LAB — POCT RAPID STREP A (OFFICE): Rapid Strep A Screen: POSITIVE — AB

## 2011-12-28 MED ORDER — AMOXICILLIN 500 MG PO CAPS: 500.0000 mg | ORAL_CAPSULE | Freq: Two times a day (BID) | ORAL | Status: DC

## 2011-12-28 NOTE — Progress Notes (Signed)
  Subjective:    Patient ID: Tracy Rose, female    DOB: 01/24/1965, 46 y.o.   MRN: [id number]  HPI  Patient here for acute visit for sore throat  Severely sore throat for 2 days, off and on for 1 month. Yesterday started feeling really sick and had dark green emesis Times two.  No more emesis today, tolerateing PO fluids well. No appetite.  No difficulty breathing but breathing heavy at times through her mouth.  Alos having ear pain and subjective fevers Also had fever earlier in the month, had genital HSV outbreak at that time.  Works as Lawyer for [employer]  Daughter with mono last month   Review of Systems + fever No chest pain No dyspnea + emesis, nausea No diarrhea, having bowel movements No muscle pains No gait problems.     Objective:   Physical Exam  Gen: NAD, alert, cooperative with exam HEENT: NCAT, EOMI, PERRL, Unable to visualize pharynx, no erythema on soft palate Neck: supple, tender lymphadenopathy CV: RRR, no murmur, good S1/S2 Resp: CTABL, no wheezes, non-labored Abd: SNTND, BS present, no guarding  Neuro: Alert and oriented, No gross deficits       Assessment & Plan:

## 2011-12-28 NOTE — Assessment & Plan Note (Signed)
Rapid strep + with fever and tender LAD, temp only 100.2 in office today.  Amoxicillin 500 BID for 10 days Return with rash or worsening coarse.

## 2011-12-28 NOTE — Patient Instructions (Signed)
You have strep throat.   I have written you a prescription for amoxicillin, 1 pill twice a day for 10 days. This is free at [facility].   If you develop a rash, start to have a hard time breathing, or if you begin to get much worse seek medical help.   If a rash develops quit taking the medicine and come back to see [place].

## 2012-07-12 ENCOUNTER — Emergency Department (HOSPITAL_COMMUNITY)
Admission: EM | Admit: 2012-07-12 | Discharge: 2012-07-12 | Disposition: A | Discharge status: Home / Self Care | Payer: Self-pay | Attending: Emergency Medicine | Admitting: Emergency Medicine

## 2012-07-12 ENCOUNTER — Encounter (HOSPITAL_COMMUNITY): Payer: Self-pay | Admitting: *Deleted

## 2012-07-12 DIAGNOSIS — Z79899 Other long term (current) drug therapy: Secondary | ICD-10-CM | POA: Insufficient documentation

## 2012-07-12 DIAGNOSIS — W540XXA Bitten by dog, initial encounter: Secondary | ICD-10-CM | POA: Insufficient documentation

## 2012-07-12 DIAGNOSIS — S81009A Unspecified open wound, unspecified knee, initial encounter: Secondary | ICD-10-CM | POA: Insufficient documentation

## 2012-07-12 DIAGNOSIS — Y9389 Activity, other specified: Secondary | ICD-10-CM | POA: Insufficient documentation

## 2012-07-12 DIAGNOSIS — S91009A Unspecified open wound, unspecified ankle, initial encounter: Secondary | ICD-10-CM | POA: Insufficient documentation

## 2012-07-12 DIAGNOSIS — Z87891 Personal history of nicotine dependence: Secondary | ICD-10-CM | POA: Insufficient documentation

## 2012-07-12 DIAGNOSIS — Y9289 Other specified places as the place of occurrence of the external cause: Secondary | ICD-10-CM | POA: Insufficient documentation

## 2012-07-12 DIAGNOSIS — Z23 Encounter for immunization: Secondary | ICD-10-CM | POA: Insufficient documentation

## 2012-07-12 MED ORDER — ACETAMINOPHEN 325 MG PO TABS
975.0000 mg | ORAL_TABLET | Freq: Once | ORAL | Status: AC
  Administered 2012-07-12: 975 mg via ORAL
  Filled 2012-07-12: qty 3

## 2012-07-12 MED ORDER — RABIES VACCINE, PCEC IM SUSR
1.0000 mL | Freq: Once | INTRAMUSCULAR | Status: AC
  Administered 2012-07-12: 1 mL via INTRAMUSCULAR
  Filled 2012-07-12: qty 1

## 2012-07-12 MED ORDER — TETANUS-DIPHTH-ACELL PERTUSSIS 5-2.5-18.5 LF-MCG/0.5 IM SUSP
0.5000 mL | Freq: Once | INTRAMUSCULAR | Status: AC
  Administered 2012-07-12: 0.5 mL via INTRAMUSCULAR
  Filled 2012-07-12: qty 0.5

## 2012-07-12 MED ORDER — RABIES IMMUNE GLOBULIN 150 UNIT/ML IM INJ
20.0000 [IU]/kg | INJECTION | Freq: Once | INTRAMUSCULAR | Status: AC
  Administered 2012-07-12: 2400 [IU] via INTRAMUSCULAR
  Filled 2012-07-12: qty 16

## 2012-07-12 MED ORDER — AMOXICILLIN-POT CLAVULANATE 875-125 MG PO TABS: 1.0000 | ORAL_TABLET | Freq: Two times a day (BID) | ORAL | Status: DC

## 2012-07-12 NOTE — ED Provider Notes (Signed)
History     CSN: [id number]  Arrival date & time 07/12/12  1205   First MD Initiated Contact with Patient 07/12/12 1218      Chief Complaint  Patient presents with  . Animal Bite    (Consider location/radiation/quality/duration/timing/severity/associated sxs/prior treatment) HPI  47 year old female presents emergency Department with chief complaint of dog bite injury.  Patient states she was at the grocery store 2 days ago when a dog that was tied up outside of a store lunch out and bit her on the left lower calf.  She states that the owner came out and apologize and stated that the dog was up-to-date on all of his vaccinations but she did not get the dog owners information.  She did not report the event to animal control.  Patient states that since that time she has had pain and swelling in the left lower leg.  She states that she's also had some nausea and, myalgias and chills.  Able to ambulate on the leg.  She denies any paresthesias of the lower extremity.  Patient denies any chest pain shortness of breath, abdominal pain,  vomiting,, headache.  History reviewed. No pertinent past medical history.  History reviewed. No pertinent past surgical history.  Family History  Problem Relation Age of Onset  . Arthritis Mother   . Diabetes Father   . Arthritis Father   . Hypertension Father   . Hyperlipidemia Father   . Heart disease Father   . Stroke Father   . Cancer Maternal Grandfather     History  Substance Use Topics  . Smoking status: Former Games developer  . Smokeless tobacco: Former Neurosurgeon    Quit date: 10/28/2010  . Alcohol Use: No    OB History   Grav Para Term Preterm Abortions TAB SAB Ect Mult Living                  Review of Systems Ten systems reviewed and are negative for acute change, except as noted in the HPI.    Allergies  Review of patient's allergies indicates no known allergies.  Home Medications   Current Outpatient Rx  Name  Route  Sig  Dispense   Refill  . acyclovir (ZOVIRAX) 200 MG capsule   Oral   Take 4 capsules (800 mg total) by mouth 2 (two) times daily. for 5 days   50 capsule   1   . b complex vitamins tablet   Oral   Take 1 tablet by mouth daily.           . Ginkgo Biloba 40 MG TABS   Oral   Take by mouth.           Tedra Coupe GINSENG PO   Oral   Take by mouth.             BP 126/91  Pulse 100  Temp(Src) 98.1 F (36.7 C) (Oral)  Resp 16  Wt 266 lb (120.657 kg)  BMI 43.26 kg/m2  SpO2 95%  Physical Exam Physical Exam  Nursing note and vitals reviewed. Constitutional: She is oriented to person, place, and time. She appears well-developed and well-nourished. No distress.  HENT:  Head: Normocephalic and atraumatic.  Eyes: Conjunctivae normal and EOM are normal. Pupils are equal, round, and reactive to light. No scleral icterus.  Neck: Normal range of motion.  Cardiovascular: Normal rate, regular rhythm and normal heart sounds.  Exam reveals no gallop and no friction rub.   No murmur heard. Pulmonary/Chest:  Effort normal and breath sounds normal. No respiratory distress.  Abdominal: Soft. Bowel sounds are normal. She exhibits no distension and no mass. There is no tenderness. There is no guarding.  Neurological: She is alert and oriented to person, place, and time.  Skin:  Several puncture wounds to the left lower extremity.  There is an area of induration and erythema along the lateral left calf with a central puncture wound.  No drainage at her central fluctuance.  This exquisitely tender to palpation.  No lymphangitis.   ED Course  Procedures (including critical care time)  Labs Reviewed - No data to display No results found.   1. Dog bite, initial encounter   2. Need for immunization against rabies       MDM  3:29 PM BP 126/91  Pulse 100  Temp(Src) 98.1 F (36.7 C) (Oral)  Resp 16  Wt 266 lb (120.657 kg)  BMI 43.26 kg/m2  SpO2 95% Patient with dog bite to the left lower extremity.   I've discussed the case with Dr. Clarene Duke.  There is no way obtain the dog owner's information at this time we have decided with the patient's permission to go ahead and prophylax against rabies.  Patient will have her T. gap updated as well.  We'll discharge the patient with Augmentin    4:26 PM BP 134/97  Pulse 84  Temp(Src) 98.3 F (36.8 C) (Oral)  Resp 16  Wt 266 lb (120.657 kg)  BMI 43.26 kg/m2  SpO2 99% Patient given rabies immunoglobulin and vaccination.  Wound infiltration with 5 ML's.  Patient will be discharged with Augmentin.  Her T. gap has been updated.  Patient will followup echo and urgent care for continued prophylaxis. The patient appears reasonably screened and/or stabilized for discharge and I doubt any other medical condition or other [facility] requiring further screening, evaluation, or treatment in the ED at this time prior to discharge.    Arthor Captain, PA-C 07/12/12 1628  Arthor Captain, PA-C 07/12/12 1628

## 2012-07-12 NOTE — ED Notes (Signed)
Reports being bit by pit bull two days ago left ankle, puncture wounds and abrasions noted, still having pain and mild swelling.

## 2012-07-15 NOTE — ED Provider Notes (Signed)
Medical screening examination/treatment/procedure(s) were performed by non-physician practitioner and as supervising physician I was immediately available for consultation/collaboration.   Desteny Freeman M Tajay Muzzy, DO 07/15/12 1814 

## 2012-11-20 ENCOUNTER — Encounter: Payer: Self-pay | Admitting: Family Medicine

## 2012-11-27 ENCOUNTER — Encounter: Payer: Self-pay | Admitting: Family Medicine

## 2013-03-11 ENCOUNTER — Emergency Department (HOSPITAL_COMMUNITY)
Admission: EM | Admit: 2013-03-11 | Discharge: 2013-03-12 | Disposition: A | Discharge status: Home / Self Care | Payer: Medicaid Other | Attending: Emergency Medicine | Admitting: Emergency Medicine

## 2013-03-11 ENCOUNTER — Encounter (HOSPITAL_COMMUNITY): Payer: Self-pay | Admitting: Emergency Medicine

## 2013-03-11 DIAGNOSIS — B9789 Other viral agents as the cause of diseases classified elsewhere: Secondary | ICD-10-CM | POA: Insufficient documentation

## 2013-03-11 DIAGNOSIS — R1084 Generalized abdominal pain: Secondary | ICD-10-CM | POA: Insufficient documentation

## 2013-03-11 DIAGNOSIS — B349 Viral infection, unspecified: Secondary | ICD-10-CM

## 2013-03-11 DIAGNOSIS — R197 Diarrhea, unspecified: Secondary | ICD-10-CM | POA: Insufficient documentation

## 2013-03-11 DIAGNOSIS — Z87891 Personal history of nicotine dependence: Secondary | ICD-10-CM | POA: Insufficient documentation

## 2013-03-11 LAB — CBC WITH DIFFERENTIAL/PLATELET
BASOS ABS: 0 10*3/uL (ref 0.0–0.1)
Basophils Relative: 0 % (ref 0–1)
EOS PCT: 1 % (ref 0–5)
Eosinophils Absolute: 0.1 10*3/uL (ref 0.0–0.7)
HEMATOCRIT: 40.2 % (ref 36.0–46.0)
HEMOGLOBIN: 13.5 g/dL (ref 12.0–15.0)
LYMPHS PCT: 18 % (ref 12–46)
Lymphs Abs: 1.2 10*3/uL (ref 0.7–4.0)
MCH: 27.3 pg (ref 26.0–34.0)
MCHC: 33.6 g/dL (ref 30.0–36.0)
MCV: 81.2 fL (ref 78.0–100.0)
MONO ABS: 0.3 10*3/uL (ref 0.1–1.0)
MONOS PCT: 4 % (ref 3–12)
Neutro Abs: 5.3 10*3/uL (ref 1.7–7.7)
Neutrophils Relative %: 76 % (ref 43–77)
Platelets: 274 10*3/uL (ref 150–400)
RBC: 4.95 MIL/uL (ref 3.87–5.11)
RDW: 14.1 % (ref 11.5–15.5)
WBC: 6.9 10*3/uL (ref 4.0–10.5)

## 2013-03-11 LAB — URINALYSIS, ROUTINE W REFLEX MICROSCOPIC
Bilirubin Urine: NEGATIVE
Glucose, UA: NEGATIVE mg/dL
Hgb urine dipstick: NEGATIVE
KETONES UR: NEGATIVE mg/dL
LEUKOCYTES UA: NEGATIVE
NITRITE: NEGATIVE
PROTEIN: NEGATIVE mg/dL
Specific Gravity, Urine: 1.005 (ref 1.005–1.030)
UROBILINOGEN UA: 0.2 mg/dL (ref 0.0–1.0)
pH: 6 (ref 5.0–8.0)

## 2013-03-11 LAB — LIPASE, BLOOD: Lipase: 39 U/L (ref 11–59)

## 2013-03-11 LAB — COMPREHENSIVE METABOLIC PANEL
ALT: 15 U/L (ref 0–35)
AST: 15 U/L (ref 0–37)
Albumin: 3.3 g/dL — ABNORMAL LOW (ref 3.5–5.2)
Alkaline Phosphatase: 69 U/L (ref 39–117)
BILIRUBIN TOTAL: 0.4 mg/dL (ref 0.3–1.2)
BUN: 11 mg/dL (ref 6–23)
CALCIUM: 8.7 mg/dL (ref 8.4–10.5)
CO2: 19 mEq/L (ref 19–32)
CREATININE: 1.17 mg/dL — AB (ref 0.50–1.10)
Chloride: 103 mEq/L (ref 96–112)
GFR calc non Af Amer: 55 mL/min — ABNORMAL LOW (ref >90)
GFR, EST AFRICAN AMERICAN: 64 mL/min — AB (ref >90)
Glucose, Bld: 98 mg/dL (ref 70–99)
Potassium: 4.1 mEq/L (ref 3.7–5.3)
Sodium: 135 mEq/L — ABNORMAL LOW (ref 137–147)
Total Protein: 7.1 g/dL (ref 6.0–8.3)

## 2013-03-11 MED ORDER — SODIUM CHLORIDE 0.9 % IV BOLUS (SEPSIS)
1000.0000 mL | Freq: Once | INTRAVENOUS | Status: AC
  Administered 2013-03-11: 1000 mL via INTRAVENOUS

## 2013-03-11 MED ORDER — ONDANSETRON HCL 4 MG/2ML IJ SOLN
4.0000 mg | Freq: Once | INTRAMUSCULAR | Status: AC
  Administered 2013-03-11: 4 mg via INTRAVENOUS
  Filled 2013-03-11: qty 2

## 2013-03-11 NOTE — ED Notes (Signed)
Per EMS pt reports having n/v/d starting this morning with generalized weakness. Dry heaved in route had a temp earlier of 103.

## 2013-03-11 NOTE — ED Notes (Signed)
Bed: YI01 Expected date:  Expected time:  Means of arrival:  Comments: EMS/N/V/D/fever

## 2013-03-11 NOTE — ED Provider Notes (Signed)
CSN: [id number]     Arrival date & time 03/11/13  2058 History   First MD Initiated Contact with Patient 03/11/13 2059     Chief Complaint  Patient presents with  . Nausea  . Emesis  . Diarrhea     (Consider location/radiation/quality/duration/timing/severity/associated sxs/prior Treatment) HPI Comments: Patient is a 48 year old female who presents with abdominal pain that started this morning. The pain is located in her generalized abdomen and does not radiate. The pain is described as cramping and mild. The pain started gradually and progressively worsened since the onset. No alleviating/aggravating factors. The patient has tried nothing for symptoms without relief. Associated symptoms include NVD and fever of 103 earlier. Patient denies headache, chest pain, SOB, dysuria, constipation, abnormal vaginal bleeding/discharge.      History reviewed. No pertinent past medical history. History reviewed. No pertinent past surgical history. Family History  Problem Relation Age of Onset  . Arthritis Mother   . Diabetes Father   . Arthritis Father   . Hypertension Father   . Hyperlipidemia Father   . Heart disease Father   . Stroke Father   . Cancer Maternal Grandfather    History  Substance Use Topics  . Smoking status: Former Research scientist (life sciences)  . Smokeless tobacco: Former Systems developer    Quit date: 10/28/2010  . Alcohol Use: No   OB History   Grav Para Term Preterm Abortions TAB SAB Ect Mult Living                 Review of Systems  Constitutional: Negative for fever, chills and fatigue.  HENT: Negative for trouble swallowing.   Eyes: Negative for visual disturbance.  Respiratory: Negative for shortness of breath.   Cardiovascular: Negative for chest pain and palpitations.  Gastrointestinal: Positive for nausea, vomiting, abdominal pain and diarrhea.  Genitourinary: Negative for dysuria and difficulty urinating.  Musculoskeletal: Negative for arthralgias and neck pain.  Skin: Negative for  color change.  Neurological: Negative for dizziness and weakness.  Psychiatric/Behavioral: Negative for dysphoric mood.      Allergies  Review of patient's allergies indicates no known allergies.  Home Medications   Current Outpatient Rx  Name  Route  Sig  Dispense  Refill  . ibuprofen (ADVIL,MOTRIN) 200 MG tablet   Oral   Take 400 mg by mouth every 6 (six) hours as needed (pain).          BP 136/81  Pulse 91  Temp(Src) 98.3 F (36.8 C) (Oral)  Resp 18  SpO2 97%  LMP 02/20/2013 Physical Exam  Nursing note and vitals reviewed. Constitutional: She is oriented to person, place, and time. She appears well-developed and well-nourished. No distress.  HENT:  Head: Normocephalic and atraumatic.  Eyes: Conjunctivae and EOM are normal.  Neck: Normal range of motion.  Cardiovascular: Normal rate and regular rhythm.  Exam reveals no gallop and no friction rub.   No murmur heard. Pulmonary/Chest: Effort normal and breath sounds normal. She has no wheezes. She has no rales. She exhibits no tenderness.  Abdominal: Soft. She exhibits no distension. There is tenderness. There is no rebound and no guarding.  Mild generalized tenderness to palpation. No focal tenderness to palpation or peritoneal signs.   Musculoskeletal: Normal range of motion.  Neurological: She is alert and oriented to person, place, and time.  Speech is goal-oriented. Moves limbs without ataxia.   Skin: Skin is warm and dry.  Psychiatric: She has a normal mood and affect. Her behavior is normal.  ED Course  Procedures (including critical care time) Labs Review Labs Reviewed  COMPREHENSIVE METABOLIC PANEL - Abnormal; Notable for the following:    Sodium 135 (*)    Creatinine, Ser 1.17 (*)    Albumin 3.3 (*)    GFR calc non Af Amer 55 (*)    GFR calc Af Amer 64 (*)    All other components within normal limits  CBC WITH DIFFERENTIAL  LIPASE, BLOOD  URINALYSIS, ROUTINE W REFLEX MICROSCOPIC   Imaging  Review No results found.  EKG Interpretation   None       MDM   Final diagnoses:  Viral illness    11:51 PM Patient likely has viral illness. Labs unremarkable for acute changes. Vitals stable and patient afebrile. Patient will have IV fluids and zofran.   12:12 AM Patient feeling better after IV fluids and zofran. Labs and urinalysis unremarkable for acute changes. Vitals stable and patient afebrile. Patient will be discharged with zofran and instructions to return with worsening or concerning symptoms.     Alvina Chou, PA-C 03/12/13 0013

## 2013-03-12 ENCOUNTER — Encounter: Payer: Self-pay | Admitting: Family Medicine

## 2013-03-12 ENCOUNTER — Other Ambulatory Visit (HOSPITAL_COMMUNITY)
Admission: RE | Admit: 2013-03-12 | Discharge: 2013-03-12 | Disposition: A | Discharge status: Home / Self Care | Payer: Medicaid Other | Source: Ambulatory Visit | Attending: Family Medicine | Admitting: Family Medicine

## 2013-03-12 ENCOUNTER — Ambulatory Visit (INDEPENDENT_AMBULATORY_CARE_PROVIDER_SITE_OTHER): Payer: Medicaid Other | Admitting: Family Medicine

## 2013-03-12 ENCOUNTER — Telehealth: Payer: Self-pay | Admitting: Family Medicine

## 2013-03-12 VITALS — BP 135/87 | HR 103 | Temp 98.2°F | Wt 257.0 lb

## 2013-03-12 DIAGNOSIS — F17200 Nicotine dependence, unspecified, uncomplicated: Secondary | ICD-10-CM

## 2013-03-12 DIAGNOSIS — R350 Frequency of micturition: Secondary | ICD-10-CM

## 2013-03-12 DIAGNOSIS — Z720 Tobacco use: Secondary | ICD-10-CM

## 2013-03-12 DIAGNOSIS — Z113 Encounter for screening for infections with a predominantly sexual mode of transmission: Secondary | ICD-10-CM | POA: Insufficient documentation

## 2013-03-12 DIAGNOSIS — Z1151 Encounter for screening for human papillomavirus (HPV): Secondary | ICD-10-CM | POA: Insufficient documentation

## 2013-03-12 DIAGNOSIS — Z124 Encounter for screening for malignant neoplasm of cervix: Secondary | ICD-10-CM | POA: Insufficient documentation

## 2013-03-12 DIAGNOSIS — Z0000 Encounter for general adult medical examination without abnormal findings: Secondary | ICD-10-CM

## 2013-03-12 DIAGNOSIS — E669 Obesity, unspecified: Secondary | ICD-10-CM

## 2013-03-12 DIAGNOSIS — N898 Other specified noninflammatory disorders of vagina: Secondary | ICD-10-CM

## 2013-03-12 LAB — POCT WET PREP (WET MOUNT)
CLUE CELLS WET PREP WHIFF POC: POSITIVE
WBC, Wet Prep HPF POC: >20

## 2013-03-12 LAB — COMPREHENSIVE METABOLIC PANEL
ALBUMIN: 4.2 g/dL (ref 3.5–5.2)
ALT: 17 U/L (ref 0–35)
AST: 12 U/L (ref 0–37)
Alkaline Phosphatase: 71 U/L (ref 39–117)
BUN: 8 mg/dL (ref 6–23)
CHLORIDE: 107 meq/L (ref 96–112)
CO2: 26 meq/L (ref 19–32)
Calcium: 8.7 mg/dL (ref 8.4–10.5)
Creat: 1.05 mg/dL (ref 0.50–1.10)
Glucose, Bld: 84 mg/dL (ref 70–99)
Potassium: 4.1 mEq/L (ref 3.5–5.3)
Sodium: 139 mEq/L (ref 135–145)
Total Bilirubin: 0.3 mg/dL (ref 0.2–1.2)
Total Protein: 7.3 g/dL (ref 6.0–8.3)

## 2013-03-12 LAB — POCT UA - MICROSCOPIC ONLY: Epithelial cells, urine per micros: >20

## 2013-03-12 LAB — POCT URINALYSIS DIPSTICK
BILIRUBIN UA: NEGATIVE
Glucose, UA: NEGATIVE
Ketones, UA: NEGATIVE
NITRITE UA: NEGATIVE
PH UA: 5.5
PROTEIN UA: NEGATIVE
RBC UA: NEGATIVE
Spec Grav, UA: 1.025
UROBILINOGEN UA: 0.2

## 2013-03-12 LAB — LIPID PANEL
Cholesterol: 185 mg/dL (ref 0–200)
HDL: 44 mg/dL (ref >39)
LDL Cholesterol: 120 mg/dL — ABNORMAL HIGH (ref 0–99)
Total CHOL/HDL Ratio: 4.2 Ratio
Triglycerides: 104 mg/dL (ref <150)
VLDL: 21 mg/dL (ref 0–40)

## 2013-03-12 LAB — POCT GLYCOSYLATED HEMOGLOBIN (HGB A1C): HEMOGLOBIN A1C: 5.8

## 2013-03-12 MED ORDER — NICOTINE POLACRILEX 2 MG MT GUM: 2.0000 mg | CHEWING_GUM | OROMUCOSAL | Status: DC | PRN

## 2013-03-12 MED ORDER — ONDANSETRON 4 MG PO TBDP: 4.0000 mg | ORAL_TABLET | Freq: Three times a day (TID) | ORAL | Status: DC | PRN

## 2013-03-12 MED ORDER — METRONIDAZOLE 500 MG PO TABS: 500.0000 mg | ORAL_TABLET | Freq: Two times a day (BID) | ORAL | Status: DC

## 2013-03-12 MED ORDER — NICOTINE 7 MG/24HR TD PT24: 7.0000 mg | MEDICATED_PATCH | Freq: Every day | TRANSDERMAL | Status: DC

## 2013-03-12 NOTE — Discharge Instructions (Signed)
Take zofran as needed for nausea. Refer to attached documents for more information. Return to the ED with worsening or concerning symptoms.  °

## 2013-03-12 NOTE — Telephone Encounter (Signed)
Patient with bacterial vaginosis (not STD) as likely cause of discharge and possibly as cause of increased urination. A1c was 5.8 so she is at risk for diabetes but does not have it. Possibly could have urine infection but think BV is cause of urine findings. Sent in Rx for flagyl. Patient should follow up in 2-3 weeks and come in sooner to see Dr. Gwendlyn Deutscher if urinary symptoms do not improve or if has back pain or is febrile.

## 2013-03-12 NOTE — Assessment & Plan Note (Signed)
Morbid obesity noted. Down 25 lbs in last 18 months. Want to make sure not related to diabetes so a1c checked today. Patient to follow up in 2-3 weeks with goals set per AVS with plan of 1 lb per week weight loss.

## 2013-03-12 NOTE — Assessment & Plan Note (Signed)
Plans to quit. Will trial nicotine patch at 1/3 ppd with nicotine gum for breakthrough. 1800quitnow advised. F/u 2-3 weeks with Dr. Darrel Hoover be able to use gum alone at that point.

## 2013-03-12 NOTE — Progress Notes (Addendum)
Tracy Reddish, MD Phone: [phone]  Subjective:  Patient presents today for annual physical. Chief complaint-noted. Patient was seen in ED yesterday but symptoms of fever and diarrhea/nausea/vomiting have resolved.   Polyuria Vaginal discharge Patient presents for evaluation of an abnormal vaginal discharge as well as polyuria. Symptoms have been present for 3 months. She is peeing every 2 hours for about 3 months. Also has increased thirst. Vaginal symptoms: discharge described as occasionally brown. Contraception: none.  Sexually transmitted infection risk: possible STD exposure. Menstrual flow: regular every 28-30 days. Family history-father with diabetes mellitus ROS-She denies blisters, bumps, burning, lesions, odor and pain.  Tobacco abuse Desires to quit. Has reduced from 2 ppd to 6 cigarettes since January. Tried no cigarettes yesterday but extremely agitated.  ROS- no chest pain or shortness of breath  Obesity Admits trying to lose weight. Not exercising. Working on diet. Has cut out sweets and sodas. Eats 3 meals per day.  ROS-no unintentional weight loss  THe following were reviewed and entered/updated in epic: Past Medical History  Diagnosis Date  . Tobacco abuse 09/08/2010  . OBESITY, NOS 03/21/2006  . GOITER, MULTINODULAR 09/05/2007    Dr. Zada Girt Endocrinology  [place] 2011: Stable to slightly smaller bilateral thyroid nodules since 09/04/2007. No new or enlarging thyroid nodules identified. Biopsy 08/2007: non neoplastic goiter. Following with endocrine.   . Genital herpes-takes acyclovir prn 12/12/2011    Past Surgical History  Procedure Laterality Date  . Wisdom tooth extraction      @ age 49    Medications- reviewed and updated Current Outpatient Prescriptions  Medication Sig Dispense Refill  . acyclovir (ZOVIRAX) 400 MG tablet Take 400 mg by mouth as needed.      Marland Kitchen ibuprofen (ADVIL,MOTRIN) 200 MG tablet Take 400 mg by mouth every 6 (six) hours as  needed (pain).      . ondansetron (ZOFRAN ODT) 4 MG disintegrating tablet Take 1 tablet (4 mg total) by mouth every 8 (eight) hours as needed for nausea or vomiting.  10 tablet  0   Allergies-reviewed and updated No Known Allergies  Social History   Social History  . Marital Status: Divorced   Social History Main Topics  . Smoking status: Former Research scientist (life sciences)  . Alcohol Use: No  . Drug Use: No   Social History Narrative   Unemployed.  Associates Degree.  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 Oropharynx normal CV: RRR no murmurs rubs or gallops Lungs: CTAB no crackles, wheeze, rhonchi Abdomen: soft/nontender/nondistended/normal bowel sounds.  Breast: deferred as needed to get to lab Pelvic: cervix normal in appearance, external genitalia normal, no adnexal masses or tenderness, no cervical motion tenderness, uterus normal size, shape, and consistency and vagina with creamy whitish discharge Ext: no edema Skin: warm, dry Neuro: grossly normal, moves all extremities, PERRLA  Results for orders placed in visit on 03/12/13 (from the past 24 hour(s))  POCT URINALYSIS DIPSTICK     Status: Abnormal   Collection Time    03/12/13  3:49 PM      Result Value Ref Range   Color, UA YELLOW     Clarity, UA CLOUDY     Glucose, UA NEG     Bilirubin, UA NEG     Ketones, UA NEG  Spec Grav, UA 1.025     Blood, UA NEG     pH, UA 5.5     Protein, UA NEG     Urobilinogen, UA 0.2     Nitrite, UA NEG     Leukocytes, UA small (1+)     Narrative:    Reflex to microscopic   POCT UA - MICROSCOPIC ONLY     Status: None   Collection Time    03/12/13  3:49 PM      Result Value Ref Range   WBC, Ur, HPF, POC 10-20     RBC, urine, microscopic 1-5     Bacteria, U Microscopic 3+      Epithelial cells, urine per micros >20    POCT WET PREP (WET MOUNT)     Status: Abnormal   Collection Time    03/12/13  4:56 PM      Result Value Ref Range   Source Wet Prep POC VAG     WBC, Wet Prep HPF POC >20     Bacteria Wet Prep HPF POC 2+ RODS & COCCI     Clue Cells Wet Prep HPF POC Few     CLUE CELLS WET PREP WHIFF POC Positive Whiff     Yeast Wet Prep HPF POC None     Trichomonas Wet Prep HPF POC None    POCT GLYCOSYLATED HEMOGLOBIN (HGB A1C)     Status: None   Collection Time    03/12/13  4:56 PM      Result Value Ref Range   Hemoglobin A1C 5.8      Assessment/Plan:  Polyuria Vaginal discharge A1c just came back at 5.8 so at risk for diabetes but not diabetic (as cause of polyuria). UTI with many epithelial cells so not the best sample. Will hold off on treating UTI and treat BV first with metronidazole x 7 days (given positive whiff and clue cells). Possible WBC in urine and bacteria from BV). Could repeat urine at follow up if symptoms have not resolved. Vaginal discharge and polyuria could both be from BV.   Tobacco abuse Plans to quit. Will trial nicotine patch at 1/3 ppd with nicotine gum for breakthrough. 1800quitnow advised. F/u 2-3 weeks with Dr. Darrel Hoover be able to use gum alone at that point.   OBESITY, NOS Morbid obesity noted. Down 25 lbs in last 18 months. Want to make sure not related to diabetes so a1c checked today. Patient to follow up in 2-3 weeks with goals set per AVS with plan of 1 lb per week weight loss.    Annual physical Screened for STDs, checked pap smear, patient opted for mammogram at age 29 q 2 years. No other well woman concerns.  Check cmet and lipids due to obesity and tobacco abuse.   >50% of 40 minute office visit was spent on counseling (obesity, need for protection from STDs) and coordination of care  Orders Placed This Encounter  Procedures  . HIV Antibody  . RPR  . Comprehensive metabolic panel  . Lipid panel  . POCT  urinalysis dipstick  . POCT Wet Prep [employer])  . POCT UA - Microscopic Only  . POCT glycosylated hemoglobin (Hb A1C)    Meds ordered this encounter  Medications  . acyclovir (ZOVIRAX) 400 MG tablet    Sig: Take 400 mg by mouth as needed.  . nicotine (NICODERM CQ - DOSED IN MG/24 HR) 7 mg/24hr patch    Sig: Place 1 patch (7 mg total)  onto the skin daily.    Dispense:  28 patch    Refill:  0  . nicotine polacrilex (RA NICOTINE) 2 MG gum    Sig: Take 1 each (2 mg total) by mouth as needed for smoking cessation.    Dispense:  100 tablet    Refill:  0

## 2013-03-12 NOTE — Patient Instructions (Addendum)
Tobacco abuse  Use nicotine patch 7mg  for 2 weeks  Day you start patch in AM, quit smoking  Call 1800 quit now for support  Use nicotine gum as needed (no more than 4 per day)  See Dr. Gwendlyn Deutscher in 2 weeks.   For your labs, I will send you a letter if there are no medication changes needed. I will call you if we need to discuss your lab results.  Orders Placed This Encounter  Procedures  . HIV Antibody -STD testing  . RPR-STD testing  . Comprehensive metabolic panel for obesity  . Lipid panel for obesity  . POCT urinalysis dipstick for frequent peeing  . POCT Wet Prep (Wet Mount)-STD testing  . POCT UA - Microscopic Only for frequent peeing  . POCT glycosylated hemoglobin (Hb A1C) for frequent peeing  You may have a urinary infection but I want to see your a1c first. We will call you tomorrow.   Obesity  You set a goal of exercising at least 15 minutes a day 3x a week to start (with ultimate goal of 150 minutes a week)  You will have 1/2 of your dinner as colorful veggies (broccoli, spinach, carrots, green beans, okra). Cauliflower even though not colorful.   Continue to cut down on sodas and sweets and starches (fewer potatoes, corn, rice, bread- just 1/4 of meal per meal)  See Dr. Gwendlyn Deutscher in 3-4 weeks, Dr. Yong Channel  Health Maintenance Due  Topic Date Due  . Pap Smear -today  06/29/2012

## 2013-03-13 LAB — RPR

## 2013-03-13 LAB — HIV ANTIBODY (ROUTINE TESTING W REFLEX): HIV: NONREACTIVE

## 2013-03-13 NOTE — Telephone Encounter (Signed)
LM for pt to call back and receive message from Dr Yong Channel.  Tracy Rose,CMA

## 2013-03-16 LAB — CERVICOVAGINAL ANCILLARY ONLY
Chlamydia: NEGATIVE
Neisseria Gonorrhea: NEGATIVE

## 2013-03-16 NOTE — ED Provider Notes (Signed)
Medical screening examination/treatment/procedure(s) were performed by non-physician practitioner and as supervising physician I was immediately available for consultation/collaboration.  EKG Interpretation   None        Richarda Blade, MD 03/16/13 1255

## 2013-03-17 ENCOUNTER — Encounter: Payer: Self-pay | Admitting: Family Medicine

## 2013-03-20 ENCOUNTER — Encounter: Payer: Self-pay | Admitting: Family Medicine

## 2013-04-07 ENCOUNTER — Ambulatory Visit (HOSPITAL_COMMUNITY)
Admission: RE | Admit: 2013-04-07 | Discharge: 2013-04-07 | Disposition: A | Discharge status: Home / Self Care | Payer: Medicaid Other | Source: Ambulatory Visit | Attending: Family Medicine | Admitting: Family Medicine

## 2013-04-07 ENCOUNTER — Encounter: Payer: Self-pay | Admitting: Family Medicine

## 2013-04-07 ENCOUNTER — Ambulatory Visit (INDEPENDENT_AMBULATORY_CARE_PROVIDER_SITE_OTHER): Payer: Medicaid Other | Admitting: Family Medicine

## 2013-04-07 VITALS — BP 138/85 | HR 80 | Ht 65.7 in | Wt 251.0 lb

## 2013-04-07 DIAGNOSIS — R059 Cough, unspecified: Secondary | ICD-10-CM

## 2013-04-07 DIAGNOSIS — E669 Obesity, unspecified: Secondary | ICD-10-CM

## 2013-04-07 DIAGNOSIS — R05 Cough: Secondary | ICD-10-CM

## 2013-04-07 DIAGNOSIS — R002 Palpitations: Secondary | ICD-10-CM

## 2013-04-07 DIAGNOSIS — R32 Unspecified urinary incontinence: Secondary | ICD-10-CM

## 2013-04-07 DIAGNOSIS — F17200 Nicotine dependence, unspecified, uncomplicated: Secondary | ICD-10-CM

## 2013-04-07 DIAGNOSIS — N62 Hypertrophy of breast: Secondary | ICD-10-CM | POA: Insufficient documentation

## 2013-04-07 DIAGNOSIS — Z720 Tobacco use: Secondary | ICD-10-CM

## 2013-04-07 DIAGNOSIS — E042 Nontoxic multinodular goiter: Secondary | ICD-10-CM | POA: Insufficient documentation

## 2013-04-07 LAB — POCT URINALYSIS DIPSTICK
Bilirubin, UA: NEGATIVE
Glucose, UA: NEGATIVE
Ketones, UA: NEGATIVE
LEUKOCYTES UA: NEGATIVE
NITRITE UA: NEGATIVE
Protein, UA: NEGATIVE
Spec Grav, UA: >=1.03
UROBILINOGEN UA: 1
pH, UA: 5.5

## 2013-04-07 LAB — POCT UA - MICROSCOPIC ONLY

## 2013-04-07 LAB — TSH: TSH: 1.443 u[IU]/mL (ref 0.350–4.500)

## 2013-04-07 NOTE — Assessment & Plan Note (Addendum)
Hx of multinodular goitre. No obvious thyromegaly. Asymptomatic currently. Palpitation might be related to thyroid disorder. TSH checked today. She planned following up with her endocrinologist.

## 2013-04-07 NOTE — Assessment & Plan Note (Addendum)
Currently asymptomatic. Likely anxiety vs thyroid disorder vs cardiac. EKG done during this visit: NSR at 70 bpm, no significant ST or T wave changes. TSH checked today. HR wnl, no beta blocker warranted at this time. If symptom persist will consider Holter monitoring.

## 2013-04-07 NOTE — Assessment & Plan Note (Signed)
Patient doing well off tobacco for 1 wk now. Ok not to start Nicotine since she is doing well without it. Patient commended on effort made so far.

## 2013-04-07 NOTE — Assessment & Plan Note (Addendum)
Stress vs urge incontinence. UA checked today. Not so impressive. Kegel exercise recommended. If worsening will refer to urologist.

## 2013-04-07 NOTE — Assessment & Plan Note (Signed)
She lost 6 lbs from last visit in Feb. Patient commended on this. Diet and exercise plan discussed. Continue weight loss plan.

## 2013-04-07 NOTE — Addendum Note (Signed)
Addended by: [place], Bradon Fester on: 04/07/2013 06:19 PM   Modules accepted: Orders

## 2013-04-07 NOTE — Assessment & Plan Note (Signed)
Large breast becoming worrisome. Associated with back and shoulder pain. I recommended weight loss and wearing tighter bra. If symptom persist despite conservative measure, I will recommend breast reduction.

## 2013-04-07 NOTE — Assessment & Plan Note (Signed)
URI vs allergy. Respiratory exam completely benign today. OTC coug medication recommended. Advised to call back if symptom persists.

## 2013-04-07 NOTE — Progress Notes (Signed)
Subjective:     Patient ID: Tracy Rose, female   DOB: 08/02/1965, 48 y.o.   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 Smoking/Cough:Patient had not smoked for 1 wk, she did not use nicotine patch or gum,she smoked since age 74 yrs. She mentioned since she quite smoking she has been coughing, cough is associated with greenish phlegm, no blood,no SOB, no chest pain.No wheezing.Prior to coughing she inhaled some gas from her home oven, she believe this might be associated with her coughing as well. Obesity: Working hard on weight loss by changing her diet and exercise, she is here for follow up. Incontinence: C/O difficulty in holding her urine, she wets her pant every so often even before she can make it to the bathroom,she wears pad daily now to avoid embarrassment, she feels this is related to her big stomach. This has been on going for 1 month. She denies dysuria or blood in urine. Palpitations: On and off for many months, associated with chest pain and numbness of her left arm. Last episode was two days ago, denies associated SOB at the time. Breast reduction:Patient would like to get her breast reduced,her large breast has become bothersome to her causing back pain and shoulder pain, she feels the weight of her breast is all on her back, she uses double bra to reduce the weight but this does not help much.  Current Outpatient Prescriptions on File Prior to Visit  Medication Sig Dispense Refill  . acyclovir (ZOVIRAX) 400 MG tablet Take 400 mg by mouth as needed.      Marland Kitchen ibuprofen (ADVIL,MOTRIN) 200 MG tablet Take 400 mg by mouth every 6 (six) hours as needed (pain).      . nicotine (NICODERM CQ - DOSED IN MG/24 HR) 7 mg/24hr patch Place 1 patch (7 mg total) onto the skin daily.  28 patch  0  . nicotine polacrilex (RA NICOTINE) 2 MG gum Take 1 each (2 mg total) by mouth as needed  for smoking cessation.  100 tablet  0  . ondansetron (ZOFRAN ODT) 4 MG disintegrating tablet Take 1 tablet (4 mg total) by mouth every 8 (eight) hours as needed for nausea or vomiting.  10 tablet  0   No current facility-administered medications on file prior to visit.      Review of Systems  Constitutional: Negative for fatigue and unexpected weight change.       Intentional weight loss  Respiratory: Positive for cough.   Cardiovascular: Positive for palpitations.  Gastrointestinal: Negative.   Genitourinary: Negative for dysuria, frequency and hematuria.       Incontinence  Musculoskeletal: Positive for back pain.       Back pain from large breast  Skin: Negative for rash.       Large breast  Psychiatric/Behavioral: Negative.   All other systems reviewed and are negative.   Filed Vitals:   04/07/13 0837 04/07/13 0906  BP: 140/98 138/85  Pulse: 80   Height: 5' 5.7" (1.669 m)   Weight: 251 lb (113.853 kg)        Objective:   Physical Exam  Nursing note and vitals reviewed. Constitutional: She is oriented to person, place, and time. She appears well-developed. No distress.  Obese  Eyes: Pupils are equal, round, and reactive to light.  Neck: Neck supple. No thyromegaly present.  Cardiovascular: Normal rate, regular rhythm, normal heart sounds  and intact distal pulses.   No murmur heard. Pulmonary/Chest: Effort normal and breath sounds normal. No respiratory distress. She has no wheezes. She has no rales.  Abdominal: Soft. Bowel sounds are normal. She exhibits no mass. There is no tenderness. There is no rebound and no guarding.  Obese  Musculoskeletal: Normal range of motion.  Neurological: She is alert and oriented to person, place, and time.  Skin:  B/L large breast,symmetrical.  Psychiatric: She has a normal mood and affect.       Assessment:     Multinodular goitre  Smoking cessation Cough Obesity Incontinence Palpitation Macromastia    Plan:      Check problem list

## 2013-04-07 NOTE — Patient Instructions (Signed)

## 2013-04-14 ENCOUNTER — Ambulatory Visit: Payer: Medicaid Other | Admitting: Family Medicine

## 2013-08-19 ENCOUNTER — Ambulatory Visit (INDEPENDENT_AMBULATORY_CARE_PROVIDER_SITE_OTHER): Payer: Medicaid Other | Admitting: Family Medicine

## 2013-08-19 ENCOUNTER — Encounter: Payer: Self-pay | Admitting: Family Medicine

## 2013-08-19 VITALS — BP 144/96 | HR 80 | Temp 98.0°F | Wt 247.0 lb

## 2013-08-19 DIAGNOSIS — N921 Excessive and frequent menstruation with irregular cycle: Secondary | ICD-10-CM

## 2013-08-19 DIAGNOSIS — N920 Excessive and frequent menstruation with regular cycle: Secondary | ICD-10-CM

## 2013-08-19 HISTORY — DX: Excessive and frequent menstruation with regular cycle: N92.0

## 2013-08-19 LAB — POCT HEMOGLOBIN: Hemoglobin: 12.2 g/dL (ref 12.2–16.2)

## 2013-08-19 LAB — TSH: TSH: 2.924 u[IU]/mL (ref 0.350–4.500)

## 2013-08-19 NOTE — Patient Instructions (Signed)
Ms Mcghee,  It was great to see you today!  I am sorry that you are having so much bleeding Could be related to perimenopausal symptoms May also be related to fibroids or other masses in your uterus Or could even be related to a thickened lining of your uterus  Please keep the appointment at [facility] for further evaluation.  Feel better soon Bernadene Bell, MD

## 2013-08-19 NOTE — Progress Notes (Signed)
Patient ID: Tracy Rose, female   DOB: [birth year], 48 y.o.   MRN: [id number]   [facility] Bernadene Bell, MD Phone: [phone]  Subjective:  Tracy Rose is a 48 y.o F who presents today for SDA  # Menorrhagia  -July 1-5 bleeding; cramps clotting, heavy; has been wearing depends and several pads inside  -July 13-16 bleeding -July 27-now bleeding -has had heavy bleeding in the past but never like this -family hx of fibroids  -1 NSVD; no complications -no trauma or intercourse -brown discharge inbetween period; no odor   All relevant systems were reviewed and were negative unless otherwise noted in the HPI  Past Medical History Patient Active Problem List   Diagnosis Date Noted  . Cough 04/07/2013  . Urine incontinence 04/07/2013  . Palpitations 04/07/2013  . Macromastia 04/07/2013  . Genital herpes 12/12/2011  . Flushing 01/25/2011  . Tobacco abuse 09/08/2010  . GOITER, MULTINODULAR 09/05/2007  . OBESITY, NOS 03/21/2006   Reviewed problem list.  Medications- reviewed and updated Chief complaint-noted No additions to family history Social history- patient is a former smoker  Objective: BP 144/96  Pulse 80  Temp(Src) 98 F (36.7 C) (Oral)  Wt 247 lb (112.038 kg)  LMP 08/19/2013 Gen: NAD, alert, cooperative with exam HEENT: NCAT, EOMI Neck: FROM, supple GU: blood visualized in the vag vault; no masses or lesions; pain on bimanual exam, external os open 1cm; no external lesions seen  Neuro: Alert and oriented, No gross deficits Skin: no rashes no lesions  Assessment/Plan: See problem based a/p

## 2013-08-19 NOTE — Assessment & Plan Note (Addendum)
Broad ddx: including endometrial hyperplasia vs perimenopausal vs fibroid or uterine mass Did have ASCUS on prior PAP 2015 but neg HPV-> f/up in 1 year Hemodynamically stable POCT hgb TSH Pelvic/transvaginal US Referral to gyn for endometrial biopsy Could consider provera if needed

## 2013-08-22 ENCOUNTER — Encounter: Payer: Self-pay | Admitting: Family Medicine

## 2013-08-26 ENCOUNTER — Ambulatory Visit (HOSPITAL_COMMUNITY): Payer: Medicaid Other

## 2013-08-28 ENCOUNTER — Ambulatory Visit (HOSPITAL_COMMUNITY)
Admission: RE | Admit: 2013-08-28 | Discharge: 2013-08-28 | Disposition: A | Discharge status: Home / Self Care | Payer: Medicaid Other | Source: Ambulatory Visit | Attending: Family Medicine | Admitting: Family Medicine

## 2013-08-28 DIAGNOSIS — N920 Excessive and frequent menstruation with regular cycle: Secondary | ICD-10-CM | POA: Diagnosis not present

## 2013-08-28 DIAGNOSIS — N921 Excessive and frequent menstruation with irregular cycle: Secondary | ICD-10-CM

## 2013-08-31 ENCOUNTER — Telehealth: Payer: Self-pay | Admitting: Family Medicine

## 2013-08-31 NOTE — Telephone Encounter (Signed)
Last message was documented in wrong pts chart.  Please ignore. Fleeger, Salome Spotted

## 2013-08-31 NOTE — Telephone Encounter (Signed)
Attempted to call pt re-ultrasound results.  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 She ultimately needs to get another [place] right after menstrual cycle to confirm thickness of stripe as this potentially could be a premalignant condition. Will also send letter [facility], MD

## 2013-08-31 NOTE — Telephone Encounter (Signed)
LM with Husband for pt to return call. Please inform of the below  Hi,   I'm covering for [employer] while he's away. This patient received an MRI to rule out a stroke on Friday. The MRI was negative for stroke. It only showed some sinus changes/conjestion (which would not be a cause for her weakness she has been experiencing).   Could you contact her to let her know of this negative result? Also, to have her try to come back in to see Dr. Jerline Pain if her symptoms persist/worsen?   Thank you!  -Georges Lynch  (above message copied from staff message) Nelva Hauk, Salome Spotted

## 2013-09-10 ENCOUNTER — Encounter: Payer: Self-pay | Admitting: Family Medicine

## 2013-09-10 ENCOUNTER — Ambulatory Visit (INDEPENDENT_AMBULATORY_CARE_PROVIDER_SITE_OTHER): Payer: Medicaid Other | Admitting: Family Medicine

## 2013-09-10 VITALS — BP 137/94 | HR 86 | Temp 98.1°F | Wt 248.0 lb

## 2013-09-10 DIAGNOSIS — N8500 Endometrial hyperplasia, unspecified: Secondary | ICD-10-CM

## 2013-09-10 DIAGNOSIS — F0781 Postconcussional syndrome: Secondary | ICD-10-CM

## 2013-09-10 DIAGNOSIS — Z0189 Encounter for other specified special examinations: Secondary | ICD-10-CM

## 2013-09-10 DIAGNOSIS — Z0283 Encounter for blood-alcohol and blood-drug test: Secondary | ICD-10-CM

## 2013-09-10 HISTORY — DX: Postconcussional syndrome: F07.81

## 2013-09-10 NOTE — Addendum Note (Signed)
Addended by: Langston Masker C on: 09/10/2013 09:04 PM   Modules accepted: Orders

## 2013-09-10 NOTE — Progress Notes (Signed)
Patient ID: Tracy Rose, female   DOB: Dec 17, 1965, 48 y.o.   MRN: [id number]   Subjective:    Patient ID: Tracy Rose, female    DOB: [birth year], 48 y.o.   MRN: [id number]  HPI  CC: hit head with wrench yesterday  # Post concussive syndrome:  Yesterday morning was working on car wheel/taking wheel off with wrenches, hit her head/forehead on the left side. She did not lose consciousness, but laid down for about 5 minutes with eyes closed. She tried to continue to work that day but had worsening symptoms including developing severe headache, double vision that has resolved, some numbness on left side.   Tried: took ibuprofen (2 tabs 200mg  during the day, later last night took 2 tablets 800mg ) with some relief  Was unable to sleep much last night due to headache pain which continues today  She has not tried any medications today  She has not noticed any changes in vision today, no change in walking, no dizziness.  States her work is requiring her to get a drug test, requests that today ROS: No dizziness, no weakness, no CP, no SOB, no nausea/vomiting, no loss of bowel/bladder  Review of Systems   See HPI for ROS. All other systems reviewed and are negative. Objective:  BP 137/94  Pulse 86  Temp(Src) 98.1 F (36.7 C) (Oral)  Wt 248 lb (112.492 kg)  LMP 08/19/2013 Vitals reviewed  General: NAD HEENT: PERRL, EOMI. Optic discs are sharp. Small healed laceration left forehead approx 1mm, mild amount of swelling near temporal area CV: RRR, normal s1/s2, no murmurs. 2+ radial and PT pulses bilaterally Resp: CTAB, normal effort Ext: no edema or cyanosis Neuro: alert and oriented. CN2-12 normal except for stating decreased sensation left upper forehead and left cheek, but normal sensation left lower face/jaw. Strength 5/5 bilaterally in grip, leg extension and flexion. States decreased sensation to light touch on left lower leg compared to right.    Assessment & Plan:  See  Problem List Documentation

## 2013-09-10 NOTE — Patient Instructions (Signed)
The hit on your head most likely gave you a minor concussion, and the headache and symptoms you are having are probably what is called Post Concussive Syndrome.  The treatment is over the counter pain medications for the headache and time to let your head heal. I will give you a letter to take today and tomorrow off, you will probably be okay to go back on Monday.  Concussion A concussion, or closed-head injury, is a brain injury caused by a direct blow to the head or by a quick and sudden movement (jolt) of the head or neck. Concussions are usually not life-threatening. Even so, the effects of a concussion can be serious. If you have had a concussion before, you are more likely to experience concussion-like symptoms after a direct blow to the head.  CAUSES  Direct blow to the head, such as from running into another player during a soccer game, being hit in a fight, or hitting your head on a hard surface.  A jolt of the head or neck that causes the brain to move back and forth inside the skull, such as in a car crash. SIGNS AND SYMPTOMS The signs of a concussion can be hard to notice. Early on, they may be missed by you, family members, and health care providers. You may look fine but act or feel differently. Symptoms are usually temporary, but they may last for days, weeks, or even longer. Some symptoms may appear right away while others may not show up for hours or days. Every head injury is different. Symptoms include:  Mild to moderate headaches that will not go away.  A feeling of pressure inside your head.  Having more trouble than usual:  Learning or remembering things you have heard.  Answering questions.  Paying attention or concentrating.  Organizing daily tasks.  Making decisions and solving problems.  Slowness in thinking, acting or reacting, speaking, or reading.  Getting lost or being easily confused.  Feeling tired all the time or lacking energy (fatigued).  Feeling  drowsy.  Sleep disturbances.  Sleeping more than usual.  Sleeping less than usual.  Trouble falling asleep.  Trouble sleeping (insomnia).  Loss of balance or feeling lightheaded or dizzy.  Nausea or vomiting.  Numbness or tingling.  Increased sensitivity to:  Sounds.  Lights.  Distractions.  Vision problems or eyes that tire easily.  Diminished sense of taste or smell.  Ringing in the ears.  Mood changes such as feeling sad or anxious.  Becoming easily irritated or angry for little or no reason.  Lack of motivation.  Seeing or hearing things other people do not see or hear (hallucinations). DIAGNOSIS Your health care provider can usually diagnose a concussion based on a description of your injury and symptoms. He or she will ask whether you passed out (lost consciousness) and whether you are having trouble remembering events that happened right before and during your injury. Your evaluation might include:  A brain scan to look for signs of injury to the brain. Even if the test shows no injury, you may still have a concussion.  Blood tests to be sure other problems are not present. TREATMENT  Concussions are usually treated in an emergency department, in urgent care, or at a clinic. You may need to stay in the hospital overnight for further treatment.  Tell your health care provider if you are taking any medicines, including prescription medicines, over-the-counter medicines, and natural remedies. Some medicines, such as blood thinners (anticoagulants) and aspirin,  may increase the chance of complications. Also tell your health care provider whether you have had alcohol or are taking illegal drugs. This information may affect treatment.  Your health care provider will send you home with important instructions to follow.  How fast you will recover from a concussion depends on many factors. These factors include how severe your concussion is, what part of your brain  was injured, your age, and how healthy you were before the concussion.  Most people with mild injuries recover fully. Recovery can take time. In general, recovery is slower in older persons. Also, persons who have had a concussion in the past or have other medical problems may find that it takes longer to recover from their current injury. HOME CARE INSTRUCTIONS General Instructions  Carefully follow the directions your health care provider gave you.  Only take over-the-counter or prescription medicines for pain, discomfort, or fever as directed by your health care provider.  Take only those medicines that your health care provider has approved.  Do not drink alcohol until your health care provider says you are well enough to do so. Alcohol and certain other drugs may slow your recovery and can put you at risk of further injury.  If it is harder than usual to remember things, write them down.  If you are easily distracted, try to do one thing at a time. For example, do not try to watch TV while fixing dinner.  Talk with family members or close friends when making important decisions.  Keep all follow-up appointments. Repeated evaluation of your symptoms is recommended for your recovery.  Watch your symptoms and tell others to do the same. Complications sometimes occur after a concussion. Older adults with a brain injury may have a higher risk of serious complications, such as a blood clot on the brain.  Tell your teachers, school nurse, school counselor, coach, athletic trainer, or work Freight forwarder about your injury, symptoms, and restrictions. Tell them about what you can or cannot do. They should watch for:  Increased problems with attention or concentration.  Increased difficulty remembering or learning new information.  Increased time needed to complete tasks or assignments.  Increased irritability or decreased ability to cope with stress.  Increased symptoms.  Rest. Rest helps  the brain to heal. Make sure you:  Get plenty of sleep at night. Avoid staying up late at night.  Keep the same bedtime hours on weekends and weekdays.  Rest during the day. Take daytime naps or rest breaks when you feel tired.  Limit activities that require a lot of thought or concentration. These include:  Doing homework or job-related work.  Watching TV.  Working on the computer.  Avoid any situation where there is potential for another head injury (football, hockey, soccer, basketball, martial arts, downhill snow sports and horseback riding). Your condition will get worse every time you experience a concussion. You should avoid these activities until you are evaluated by the appropriate follow-up health care providers. Returning To Your Regular Activities You will need to return to your normal activities slowly, not all at once. You must give your body and brain enough time for recovery.  Do not return to sports or other athletic activities until your health care provider tells you it is safe to do so.  Ask your health care provider when you can drive, ride a bicycle, or operate heavy machinery. Your ability to react may be slower after a brain injury. Never do these activities if you are  dizzy.  Ask your health care provider about when you can return to work or school. Preventing Another Concussion It is very important to avoid another brain injury, especially before you have recovered. In rare cases, another injury can lead to permanent brain damage, brain swelling, or death. The risk of this is greatest during the first 7-10 days after a head injury. Avoid injuries by:  Wearing a seat belt when riding in a car.  Drinking alcohol only in moderation.  Wearing a helmet when biking, skiing, skateboarding, skating, or doing similar activities.  Avoiding activities that could lead to a second concussion, such as contact or recreational sports, until your health care provider says  it is okay.  Taking safety measures in your home.  Remove clutter and tripping hazards from floors and stairways.  Use grab bars in bathrooms and handrails by stairs.  Place non-slip mats on floors and in bathtubs.  Improve lighting in dim areas. SEEK MEDICAL CARE IF:  You have increased problems paying attention or concentrating.  You have increased difficulty remembering or learning new information.  You need more time to complete tasks or assignments than before.  You have increased irritability or decreased ability to cope with stress.  You have more symptoms than before. Seek medical care if you have any of the following symptoms for more than 2 weeks after your injury:  Lasting (chronic) headaches.  Dizziness or balance problems.  Nausea.  Vision problems.  Increased sensitivity to noise or light.  Depression or mood swings.  Anxiety or irritability.  Memory problems.  Difficulty concentrating or paying attention.  Sleep problems.  Feeling tired all the time. SEEK IMMEDIATE MEDICAL CARE IF:  You have severe or worsening headaches. These may be a sign of a blood clot in the brain.  You have weakness (even if only in one hand, leg, or part of the face).  You have numbness.  You have decreased coordination.  You vomit repeatedly.  You have increased sleepiness.  One pupil is larger than the other.  You have convulsions.  You have slurred speech.  You have increased confusion. This may be a sign of a blood clot in the brain.  You have increased restlessness, agitation, or irritability.  You are unable to recognize people or places.  You have neck pain.  It is difficult to wake you up.  You have unusual behavior changes.  You lose consciousness. MAKE SURE YOU:  Understand these instructions.  Will watch your condition.  Will get help right away if you are not doing well or get worse. Document Released: 03/31/2003 Document Revised:  01/13/2013 Document Reviewed: 07/31/2012 [facility] Patient Information 2015 [place], [place]. This information is not intended to replace advice given to you by your health care provider. Make sure you discuss any questions you have with your health care provider.

## 2013-09-10 NOTE — Assessment & Plan Note (Signed)
Mild post concussive syndrome. Wound on head appears well healed. Exam is overall reassuring, her decreased sensation on the left is not consistent with specific neurologic etiology; I do not believe she is having an intracranial bleed and discussed with her the possibility of CT of head, which I do not feel is warranted at this time. Her symptoms/headache are most consistent with post concussive syndrome. Plan: symptomatic treatment of headache with OTC pain medications (advised to try different medications, tylenol, excedrin migraine, to avoid using as much ibuprofen as she used yesterday due to GI side effects), can ice head 15 minutes on 3-4 times a day. Discussed red flag symptoms and reasons to return to care/go to the ED. Given letter to take today and tomorrow off from work, return on Monday.

## 2013-09-11 ENCOUNTER — Emergency Department (HOSPITAL_COMMUNITY)
Admission: EM | Admit: 2013-09-11 | Discharge: 2013-09-11 | Disposition: A | Discharge status: Home / Self Care | Payer: Medicaid Other | Attending: Emergency Medicine | Admitting: Emergency Medicine

## 2013-09-11 ENCOUNTER — Telehealth: Payer: Self-pay | Admitting: *Deleted

## 2013-09-11 ENCOUNTER — Emergency Department (HOSPITAL_COMMUNITY): Payer: Medicaid Other

## 2013-09-11 ENCOUNTER — Encounter (HOSPITAL_COMMUNITY): Payer: Self-pay | Admitting: Emergency Medicine

## 2013-09-11 DIAGNOSIS — R51 Headache: Secondary | ICD-10-CM | POA: Insufficient documentation

## 2013-09-11 DIAGNOSIS — Z87891 Personal history of nicotine dependence: Secondary | ICD-10-CM | POA: Diagnosis not present

## 2013-09-11 DIAGNOSIS — E669 Obesity, unspecified: Secondary | ICD-10-CM | POA: Insufficient documentation

## 2013-09-11 DIAGNOSIS — Z8619 Personal history of other infectious and parasitic diseases: Secondary | ICD-10-CM | POA: Diagnosis not present

## 2013-09-11 DIAGNOSIS — G8911 Acute pain due to trauma: Secondary | ICD-10-CM | POA: Insufficient documentation

## 2013-09-11 DIAGNOSIS — S0990XA Unspecified injury of head, initial encounter: Secondary | ICD-10-CM

## 2013-09-11 LAB — DRUG SCREEN URINE W/ALC, PAIN MGMT, REFLEX
AMPHETAMINE SCRN UR: NEGATIVE
Barbiturate Quant, Ur: NEGATIVE
Benzodiazepines.: NEGATIVE
Cocaine Metabolites: NEGATIVE
Creatinine,U: 199.64 mg/dL
Ethyl Alcohol: <10 mg/dL (ref <10)
MARIJUANA METABOLITE: NEGATIVE
Methadone: NEGATIVE
Opiates: NEGATIVE
PHENCYCLIDINE (PCP): NEGATIVE
Propoxyphene: NEGATIVE

## 2013-09-11 MED ORDER — ASPIRIN 81 MG PO CHEW: 324.0000 mg | CHEWABLE_TABLET | Freq: Once | ORAL | Status: DC

## 2013-09-11 MED ORDER — HYDROCODONE-ACETAMINOPHEN 5-325 MG PO TABS: ORAL_TABLET | ORAL | Status: DC

## 2013-09-11 MED ORDER — OXYCODONE-ACETAMINOPHEN 5-325 MG PO TABS
2.0000 | ORAL_TABLET | Freq: Once | ORAL | Status: AC
  Administered 2013-09-11: 2 via ORAL
  Filled 2013-09-11: qty 2

## 2013-09-11 MED ORDER — NAPROXEN 250 MG PO TABS: 250.0000 mg | ORAL_TABLET | Freq: Two times a day (BID) | ORAL | Status: DC | PRN

## 2013-09-11 NOTE — ED Provider Notes (Signed)
CSN: [id number]     Arrival date & time 09/11/13  0559 History   First MD Initiated Contact with Patient 09/11/13 [phone]     Chief Complaint  Patient presents with  . Headache    The patient said she got hit over the head with wrench at work yesterday.  She went to [facility] practice and they treated her and discharged her told her to take OTC pain meds.      HPI Pt was seen at 0705. Per pt, c/o sudden onset and resolution of one episode of head injury that occurred 2 days ago. Pt states she was accidentally hit herself in the left forehead with a wrench while working on a wheel. Denies LOC. Pt states she was evaluated yesterday by her PMD for same, dx concussion and rx OTC pain meds. Pt states "they aren't working on my pain." Pt also concerned she "needs a CT scan." Denies any change in her symptoms from yesterday. Denies AMS, no neck or back pain, no CP/SOB, no abd pain, no N/V/D, no visual changes, no focal motor weakness, no tingling/numbness in extremities, no ataxia.      Past Medical History  Diagnosis Date  . Tobacco abuse 09/08/2010  . OBESITY, NOS 03/21/2006        . GOITER, MULTINODULAR 09/05/2007    Dr. Zada [facility]  [place] 2011: Stable to slightly smaller bilateral thyroid nodules since 09/04/2007. No new or enlarging thyroid nodules identified. Biopsy 08/2007: non neoplastic goiter   . Genital herpes 12/12/2011   Past Surgical History  Procedure Laterality Date  . Wisdom tooth extraction      @ age 55   Family History  Problem Relation Age of Onset  . Arthritis Mother   . Diabetes Father   . Arthritis Father   . Hypertension Father   . Hyperlipidemia Father   . Heart disease Father   . Stroke Father   . Cancer Maternal Grandfather    History  Substance Use Topics  . Smoking status: Former Smoker    Quit date: 03/31/2013  . Smokeless tobacco: Former Systems developer    Quit date: 10/28/2010  . Alcohol Use: No    Review of Systems ROS: Statement: All  systems negative except as marked or noted in the HPI; Constitutional: Negative for fever and chills. ; ; Eyes: Negative for eye pain, redness and discharge. ; ; ENMT: Negative for ear pain, hoarseness, nasal congestion, sinus pressure and sore throat. ; ; Cardiovascular: Negative for chest pain, palpitations, diaphoresis, dyspnea and peripheral edema. ; ; Respiratory: Negative for cough, wheezing and stridor. ; ; Gastrointestinal: Negative for nausea, vomiting, diarrhea, abdominal pain, blood in stool, hematemesis, jaundice and rectal bleeding. . ; ; Genitourinary: Negative for dysuria, flank pain and hematuria. ; ; Musculoskeletal: +head injury. Negative for back pain and neck pain. Negative for swelling and trauma.; ; Skin: Negative for pruritus, rash, abrasions, blisters, bruising and skin lesion.; ; Neuro: +headache. Negative for lightheadedness and neck stiffness. Negative for weakness, altered level of consciousness , altered mental status, extremity weakness, paresthesias, involuntary movement, seizure and syncope.     Allergies  Review of patient's allergies indicates no known allergies.  Home Medications   Prior to Admission medications   Not on File   BP 150/95  Pulse 79  Temp(Src) 98.6 F (37 C) (Oral)  Resp 18  SpO2 99%  LMP 08/19/2013 Physical Exam 0710: Physical examination:  Nursing notes reviewed; Vital signs and O2 SAT reviewed;  Constitutional: Well developed, Well nourished, Well hydrated, In no acute distress; Head:  Normocephalic, atraumatic; Eyes: EOMI, PERRL, No scleral icterus; ENMT: TM's clear bilat. Mouth and pharynx normal, Mucous membranes moist; Neck: Supple, Full range of motion, No lymphadenopathy; Cardiovascular: Regular rate and rhythm, No murmur, rub, or gallop; Respiratory: Breath sounds clear & equal bilaterally, No rales, rhonchi, wheezes.  Speaking full sentences with ease, Normal respiratory effort/excursion; Chest: Nontender, Movement normal; Abdomen:  Soft, Nontender, Nondistended, Normal bowel sounds; Genitourinary: No CVA tenderness; Spine:  No midline CS, TS, LS tenderness.;; Extremities: Pulses normal, No tenderness, No edema, No calf edema or asymmetry.; Neuro: AA&Ox3, Major CN grossly intact. No facial droop. Speech clear. No gross focal motor or sensory deficits in extremities. Climbs on and off stretcher easily by herself. Gait steady.; Skin: Color normal, Warm, Dry.   ED Course  Procedures     EKG Interpretation None      MDM  MDM Reviewed: previous chart, nursing note and vitals Interpretation: CT scan   Ct Head Wo Contrast 09/11/2013   CLINICAL DATA:  Blow to the head.  EXAM: CT HEAD WITHOUT CONTRAST  TECHNIQUE: Contiguous axial images were obtained from the base of the skull through the vertex without intravenous contrast.  COMPARISON:  Head CT scan 11/02/2008.  FINDINGS: There is no evidence of acute intracranial abnormality including hemorrhage, infarct, mass lesion, mass effect, midline shift or abnormal extra-axial fluid collection. The no hydrocephalus or pneumocephalus. The calvarium is intact.  IMPRESSION: Negative head CT.   Electronically Signed   By: Inge Rise M.D.   On: 09/11/2013 07:45    0755:  Pt reassured. Dx and testing d/w pt.  Questions answered.  Verb understanding, agreeable to d/c home with outpt f/u.   Francine Graven, DO 09/12/13 [phone]

## 2013-09-11 NOTE — ED Notes (Signed)
The patient said she got hit over the head with wrench at work yesterday.  She went to [facility] practice and they treated her and discharged her told her to take OTC pain meds.  The patient presents with a severe headache and says she cannot take any more ibuprofen.  She rates her pain 9/10.

## 2013-09-11 NOTE — Discharge Instructions (Signed)
°Emergency Department Resource Guide °1) Find a Doctor and Pay Out of Pocket °Although you won't have to find out who is covered by your insurance plan, it is a good idea to ask around and get recommendations. You will then need to call the office and see if the doctor you have chosen will accept you as a new patient and what types of options they offer for patients who are self-pay. Some doctors offer discounts or will set up payment plans for their patients who do not have insurance, but you will need to ask so you aren't surprised when you get to your appointment. ° °2) Contact Your Local Health Department °Not all health departments have doctors that can see patients for sick visits, but many do, so it is worth a call to see if yours does. If you don't know where your local health department is, you can check in your phone book. The CDC also has a tool to help you locate your state's health department, and many state websites also have listings of all of their local health departments. ° °3) Find a Walk-in Clinic °If your illness is not likely to be very severe or complicated, you may want to try a walk in clinic. These are popping up all over the country in pharmacies, drugstores, and shopping centers. They're usually staffed by nurse practitioners or physician assistants that have been trained to treat common illnesses and complaints. They're usually fairly quick and inexpensive. However, if you have serious medical issues or chronic medical problems, these are probably not your best option. ° °No Primary Care Doctor: °- Call [facility] at  832-8000 - they can help you locate a primary care doctor that  accepts your insurance, provides certain services, etc. °- Physician Referral Service- 1-800-533-3463 ° °Chronic Pain Problems: °Organization         Address  Phone   Notes  °[facility]  (336) 297-2271 Patients need to be referred by their primary care doctor.  ° °Medication  Assistance: °Organization         Address  Phone   Notes  °[place] [facility] [address] °[place], [place] [address] (336) 641-8030 --Must be a resident of [place] °-- Must have NO insurance coverage whatsoever (no Medicaid/ Medicare, etc.) °-- The pt. MUST have a primary care doctor that directs their care regularly and follows them in the community °  °[facility]  (866) 331-1348   °[employer]  (888) 892-1162   ° °Agencies that provide inexpensive medical care: °Organization         Address  Phone   Notes  °[facility]  (336) 832-8035   °[facility]    (336) 832-7272   °[facility] [address] °[place], [place] [address] (336) 832-4777   °[facility] [address], °[place] (336) 271-4999   °[facility]    (336) 373-0678   °[facility]    (336) 272-1050   °[facility] ° [address], [place] Phone:  (336) 832-4444, Fax:  (336) 832-4440 Hours of Operation:  9 am - 6 pm, M-F.  Also accepts Medicaid/Medicare and self-pay.  °[facility] ° [address],  Phone: (336) 832-3150, Fax: (336) 832-3151. Hours of Operation:  8:30 am - 5:30 pm, M-F.  Also accepts Medicaid and self-pay.  °[facility] [place] [address], [place] Phone: (336) 878-6027   °[facility] [address], [place], [place] (336)723-1848, Ext. 123 Mondays & Thursdays: 7-9 AM.  First 15 patients are seen on a first come, first serve basis. °  ° °Medicaid-accepting [place] Providers: ° °Organization         Address  Phone   Notes  °[facility] [address], [place] (336) 641-2100 Also accepts self-pay patients.  °[facility] [address], [place] ° (336) 856-9996   °[facility] [address], [place]  (336) 288-8857   °[facility] [address] [place] [address], [place] (336) 299-7000   °Veita Bland [address], [place]  ° (336) 373-1557 Only accepts [place] Access Medicaid patients after they have their name applied to their card.  ° °Self-Pay (no insurance) in [place]: ° °Organization         Address  Phone   Notes  °Sickle Cell Patients, [facility] [address], [place] (336) 832-1970   °[facility] [address], [place] (336) 832-4400   °[facility] [place] ° [address], [place] (336) 992-4800   °[facility]/Dr. Osei-Bonsu ° [address] [place] [address], [place] or [address], [place] (336) 841-8500 Phone number for both [place] and [place] locations is the same.  °[facility] [address], [place] (336) 299-0000   °[facility] [address] [place] [address], [place] or [address] (336) 852-7530 °(336) 878-2260   °[facility] [address], [place] (336) 350-1642, phone; (336) 294-5005, fax Sees patients 1st and 3rd Saturday of every month.  Must not qualify for public or private insurance (i.e. Medicaid, Medicare, [place] Health Choice, Veterans' Benefits) • Household income should be no more than 200% of the poverty level •The clinic cannot treat you if you are pregnant or think you are pregnant • Sexually transmitted diseases are not treated at the clinic.  ° ° °Dental Care: °Organization         Address  Phone  Notes  °[place] [facility] [address], [place] (336) 641-6152 Accepts children up to age 21 who are enrolled in Medicaid or [place] Health Choice; pregnant women with a Medicaid card; and children who have applied for Medicaid or [place] Health Choice, but were declined, whose parents can pay a reduced fee at time of service.  °[place]  [facility] [place]  [address], [place] (336) 641-7733 Accepts children up to age 21 who are enrolled in Medicaid or [place] Health Choice; pregnant women with a Medicaid card; and children who have applied for Medicaid or [place] Health Choice, but were declined, whose parents can pay a reduced fee at time of service.  °[facility] ° [address], [place] (336) 641-4533 Patients are seen by appointment only. Walk-ins are not accepted. [facility] will see patients 18 years of age and older. °Monday - Tuesday (8am-5pm) °Most Wednesdays (8:30-5pm) °$30 per visit, cash only  °[facility] ° [address], [place] (336) 641-4533 Patients are seen by appointment only. Walk-ins are not accepted. [facility] will see patients 18 years of age and older. °One   Wednesday Evening (Monthly: Volunteer Based).  $30 per visit, cash only  °[facility]  (919) 537-3737 for adults; Children under age 4, call Graduate Pediatric Dentistry at (919) [phone]. Children aged 4-14, please call (919) 537-3737 to request a pediatric application. ° Dental services are provided in all areas of dental care including fillings, crowns and bridges, complete and partial dentures, implants, gum treatment, root canals, and extractions. Preventive care is also provided. Treatment is provided to both adults and children. °Patients are selected via a lottery and there is often a waiting list. °  °[facility] [address], °[place] ° (336) 763-8833 [email or web address] °  °[facility] [address], [place], [place] (336)723-1848, Ext. 123 Second and Fourth Thursday of each month, opens at 6:30 AM; Clinic ends at 9 AM.  Patients are seen on a first-come first-served basis, and a limited number are seen during each clinic.  ° °[facility] ° [address], [place], [place] (336) 723-7904    Eligibility Requirements °You must have lived in [place], [place], or [place] counties for at least the last three months. °  You cannot be eligible for state or federal sponsored healthcare insurance, including Veterans Administration, Medicaid, or Medicare. °  You generally cannot be eligible for healthcare insurance through your employer.  °  How to apply: °Eligibility screenings are held every Tuesday and Wednesday afternoon from 1:00 pm until 4:00 pm. You do not need an appointment for the interview!  °[facility] [address], [place], [place] 336-631-2330   °[facility] [facility]  336-342-8273   °[facility]  336-703-3100   °[facility]  336-570-6415   ° °Behavioral Health Resources in the Community: °Intensive Outpatient Programs °Organization         Address  Phone  Notes  °[facility] [address], [place], [place] 336-878-6098   °[facility] [address], [place], [place] 336-832-9800   °[facility] [address], [place], [place] ° 336-882-2125   °[facility] [address],  °[place], [place] 1-800-853-5163 or 336-641-4981   °Substance Abuse Resources °Organization         Address  Phone  Notes  °[facility]  336-882-2125   °[facility]  336-784-9470   °[facility]  336-285-9073   °[facility]  336-845-3988   °Residential & Outpatient Substance Abuse Program  1-800-659-3381   °Psychological Services °Organization         Address  Phone  Notes  °[facility]  336- 832-9600   °[facility]  336- 378-7881   °[facility] [address], [place] 1-800-853-5163 or 336-641-4981   ° °Mobile Crisis Teams °Organization         Address  Phone  Notes  °[facility], Mobile Crisis Care Unit  1-877-626-1772   °Assertive °[facility] ° [address]  [place], [place] 336-834-9664   °Sharon DeEsch [address] °[place] 336-554-5454   ° °Self-Help/Support Groups °Organization         Address  Phone             Notes  °[facility] - variety of support groups  336- 373-1402 Call for more information  °Narcotics Anonymous (NA), [facility] [address], °[place]  2 meetings at this location  ° °  Residential Treatment Programs °Organization         Address  Phone  Notes  °[facility] [address],    °[place]  1-866-801-8205   °[facility] ° [address], [place], [place] 704-293-8524   °[facility] [address], [place] 336-845-3988 Admissions: 8am-3pm M-F  °[facility] [address]    °[place], [place] 336-841-1104   °[facility] [address], [place], [place] 336-379-7146   °[facility] [address]  °[place], [place] 336-285-9073   °[facility] - Intensive Outpatient [address], [place], [place] 336-852-3033   °[facility] [address]  °[place], [place] 1-877-615-2722 or 336-784-9470   °[facility] [address] [place], [place] 336-227-7417 Accepts Medicaid  °[facility] [address]  °[place] 1-800-659-3381 Substance Abuse/Addiction Treatment  ° °[place] Behavioral Health Resources °Organization         Address  Phone  Notes  °[facility]  (888) 581-9988   °Julie Brannon, PhD [address] [place], [place]   (336) 349-5553 or (336) 951-0000   °[facility]   [address] °[place], [place] (336) 349-4454   °[facility] [address], [place], [place] (336) 342-8316 Insurance/Medicaid/sponsorship through [facility]  °[facility] [address]                                    [place], [place] (336) 342-8316 Therapy/tele-psych/case    °[facility] [address]  ° [place], [place] (336) 349-2233    °Dr. Arfeen  (336) 349-4544   °[facility] [place]  [employer] [place] [facility] 1) [address], [place] °2) [address], [place] °3)  [address], [place] (336) 349-3220 °(336) 342-7768 ° °(336) 342-8140   °[place] [facility] (336) 342-1394 or (336) 342-3537 (After Hours)    ° ° °Take the prescriptions as directed.  Call your regular medical doctor today to schedule a follow up appointment within the next 2 days.  Return to the Emergency Department immediately sooner if worsening.  ° °

## 2013-09-11 NOTE — Telephone Encounter (Signed)
Orders placed for repeat transvaginal and pelvic ultrasound.   Impression from previous exam indicates to have a followup week immediately after menses starts.  Pt informed and agreeable to call us the first day of her next period.  Khloe Hunkele, Salome Spotted

## 2013-09-11 NOTE — Addendum Note (Signed)
Addended by: [place] Pink on: 09/11/2013 03:28 PM   Modules accepted: Orders

## 2013-09-14 ENCOUNTER — Encounter: Payer: Self-pay | Admitting: Family Medicine

## 2013-09-15 ENCOUNTER — Encounter: Payer: Self-pay | Admitting: *Deleted

## 2013-09-15 ENCOUNTER — Emergency Department (HOSPITAL_COMMUNITY)
Admission: EM | Admit: 2013-09-15 | Discharge: 2013-09-15 | Discharge status: Against Medical Advice | Payer: Medicaid Other | Attending: Emergency Medicine | Admitting: Emergency Medicine

## 2013-09-15 ENCOUNTER — Encounter (HOSPITAL_COMMUNITY): Payer: Self-pay | Admitting: Emergency Medicine

## 2013-09-15 DIAGNOSIS — Y990 Civilian activity done for income or pay: Secondary | ICD-10-CM | POA: Insufficient documentation

## 2013-09-15 DIAGNOSIS — IMO0002 Reserved for concepts with insufficient information to code with codable children: Secondary | ICD-10-CM | POA: Diagnosis not present

## 2013-09-15 DIAGNOSIS — S0990XA Unspecified injury of head, initial encounter: Secondary | ICD-10-CM | POA: Insufficient documentation

## 2013-09-15 DIAGNOSIS — Z87891 Personal history of nicotine dependence: Secondary | ICD-10-CM | POA: Diagnosis not present

## 2013-09-15 DIAGNOSIS — Y9289 Other specified places as the place of occurrence of the external cause: Secondary | ICD-10-CM | POA: Diagnosis not present

## 2013-09-15 DIAGNOSIS — Y9389 Activity, other specified: Secondary | ICD-10-CM | POA: Diagnosis not present

## 2013-09-15 DIAGNOSIS — E669 Obesity, unspecified: Secondary | ICD-10-CM | POA: Diagnosis not present

## 2013-09-15 MED ORDER — OXYCODONE-ACETAMINOPHEN 5-325 MG PO TABS
1.0000 | ORAL_TABLET | Freq: Once | ORAL | Status: AC
Start: 2013-09-15 — End: 2013-09-15
  Administered 2013-09-15: 1 via ORAL
  Filled 2013-09-15: qty 1

## 2013-09-15 NOTE — ED Notes (Signed)
No answer x 2 to page

## 2013-09-15 NOTE — ED Notes (Signed)
Pt in stating she was hit in the head with a wrench at work last week and has continued pain since that time, symptoms are getting progressively worse, states she was told she had a concussion- was seen on 8/21 and had CT scan completed that was negative and was given percocet, but states since being home the medications she was given are not helping

## 2013-09-15 NOTE — Progress Notes (Signed)
Pt in nurse clinic for triage stating she was hit in the head about 6 days ago with a tire wrench changing a tire.  Pt is complaining of severe headache, nausea, vomiting, left eye visual changes (spots) and dizziness.  Precepted with Dr. Gwendlyn Deutscher have be seen in ED ASAP.  Derl Barrow, RN

## 2013-09-16 ENCOUNTER — Encounter: Payer: Self-pay | Admitting: Family Medicine

## 2013-09-16 ENCOUNTER — Encounter (HOSPITAL_COMMUNITY): Payer: Self-pay | Admitting: Emergency Medicine

## 2013-09-16 ENCOUNTER — Ambulatory Visit (INDEPENDENT_AMBULATORY_CARE_PROVIDER_SITE_OTHER): Payer: Medicaid Other | Admitting: Family Medicine

## 2013-09-16 ENCOUNTER — Emergency Department (HOSPITAL_COMMUNITY)
Admission: EM | Admit: 2013-09-16 | Discharge: 2013-09-16 | Disposition: A | Discharge status: Home / Self Care | Payer: Medicaid Other | Attending: Emergency Medicine | Admitting: Emergency Medicine

## 2013-09-16 ENCOUNTER — Encounter: Payer: Self-pay | Admitting: Obstetrics & Gynecology

## 2013-09-16 VITALS — BP 143/92 | HR 75 | Temp 98.0°F | Wt 253.0 lb

## 2013-09-16 DIAGNOSIS — F0781 Postconcussional syndrome: Secondary | ICD-10-CM

## 2013-09-16 DIAGNOSIS — Z87891 Personal history of nicotine dependence: Secondary | ICD-10-CM | POA: Diagnosis not present

## 2013-09-16 DIAGNOSIS — Z862 Personal history of diseases of the blood and blood-forming organs and certain disorders involving the immune mechanism: Secondary | ICD-10-CM | POA: Diagnosis not present

## 2013-09-16 DIAGNOSIS — Z8619 Personal history of other infectious and parasitic diseases: Secondary | ICD-10-CM | POA: Insufficient documentation

## 2013-09-16 DIAGNOSIS — R270 Ataxia, unspecified: Secondary | ICD-10-CM

## 2013-09-16 DIAGNOSIS — G44319 Acute post-traumatic headache, not intractable: Secondary | ICD-10-CM

## 2013-09-16 DIAGNOSIS — S060X0S Concussion without loss of consciousness, sequela: Secondary | ICD-10-CM

## 2013-09-16 DIAGNOSIS — M542 Cervicalgia: Secondary | ICD-10-CM

## 2013-09-16 DIAGNOSIS — E669 Obesity, unspecified: Secondary | ICD-10-CM | POA: Diagnosis not present

## 2013-09-16 DIAGNOSIS — R279 Unspecified lack of coordination: Secondary | ICD-10-CM

## 2013-09-16 DIAGNOSIS — Z8639 Personal history of other endocrine, nutritional and metabolic disease: Secondary | ICD-10-CM | POA: Insufficient documentation

## 2013-09-16 MED ORDER — DIPHENHYDRAMINE HCL 25 MG PO TABS: 25.0000 mg | ORAL_TABLET | Freq: Four times a day (QID) | ORAL | Status: DC | PRN

## 2013-09-16 MED ORDER — KETOROLAC TROMETHAMINE 30 MG/ML IJ SOLN
30.0000 mg | Freq: Once | INTRAMUSCULAR | Status: AC
  Administered 2013-09-16: 30 mg via INTRAVENOUS
  Filled 2013-09-16: qty 1

## 2013-09-16 MED ORDER — SODIUM CHLORIDE 0.9 % IV BOLUS (SEPSIS)
1000.0000 mL | Freq: Once | INTRAVENOUS | Status: AC
  Administered 2013-09-16: 1000 mL via INTRAVENOUS

## 2013-09-16 MED ORDER — METOCLOPRAMIDE HCL 5 MG/ML IJ SOLN
10.0000 mg | Freq: Once | INTRAMUSCULAR | Status: AC
  Administered 2013-09-16: 10 mg via INTRAVENOUS
  Filled 2013-09-16: qty 2

## 2013-09-16 MED ORDER — CYCLOBENZAPRINE HCL 5 MG PO TABS: 5.0000 mg | ORAL_TABLET | Freq: Every day | ORAL | Status: DC | PRN

## 2013-09-16 MED ORDER — DIPHENHYDRAMINE HCL 50 MG/ML IJ SOLN
25.0000 mg | Freq: Once | INTRAMUSCULAR | Status: AC
  Administered 2013-09-16: 25 mg via INTRAVENOUS
  Filled 2013-09-16: qty 1

## 2013-09-16 NOTE — Assessment & Plan Note (Signed)
With ataxia, abnormal coordination on cerebellar testing, and pt's level of concern with nausea and one episode of vomiting yesterday, favor further evaluation. Most likely still post-concussive syndrome. - MRI brain without contrast ordered and scheduled. - Mental and physical rest prescribed. - Return precautions reviewed including neurologic changes. - F/u with PCP in 2 weeks if not improving.

## 2013-09-16 NOTE — Patient Instructions (Signed)
Your neurologic exam is mildly concerning with abnormal gait so we will check an MRI of your brain to rule out any changes.  Your pain is likely a muscle spasm of your trapezius. You can take flexeril if you have muscle spasm severe neck pain. Otherwise do not take it. It can cause drowsiness so do not operate heavy machinery. This is still likely post concussive syndrome.  You need to get good rest until symptoms are resolving. This means physical and mental rest. I will give you a work note. Seek immediate care if you have new neurologic changes like changes in vision, hearing, gait, or other concerns. Otherwise, follow up with Dr Gwendlyn Deutscher in 2 weeks if no better.  Best,  Hilton Sinclair, MD   Head Injury You have a head injury. Headaches and throwing up (vomiting) are common after a head injury. It should be easy to wake up from sleeping. Sometimes you must stay in the hospital. Most problems happen within the first 24 hours. Side effects may occur up to 7-10 days after the injury.  WHAT ARE THE TYPES OF HEAD INJURIES? Head injuries can be as minor as a bump. Some head injuries can be more severe. More severe head injuries include:  A jarring injury to the brain (concussion).  A bruise of the brain (contusion). This mean there is bleeding in the brain that can cause swelling.  A cracked skull (skull fracture).  Bleeding in the brain that collects, clots, and forms a bump (hematoma). WHEN SHOULD I GET HELP RIGHT AWAY?   You are confused or sleepy.  You cannot be woken up.  You feel sick to your stomach (nauseous) or keep throwing up (vomiting).  Your dizziness or unsteadiness is getting worse.  You have very bad, lasting headaches that are not helped by medicine. Take medicines only as told by your doctor.  You cannot use your arms or legs like normal.  You cannot walk.  You notice changes in the black spots in the center of the colored part of your eye (pupil).  You  have clear or bloody fluid coming from your nose or ears.  You have trouble seeing. During the next 24 hours after the injury, you must stay with someone who can watch you. This person should get help right away (call 911 in the [place] if you start to shake and are not able to control it (have seizures), you pass out, or you are unable to wake up. HOW CAN I PREVENT A HEAD INJURY IN THE FUTURE?  Wear seat belts.  Wear a helmet while bike riding and playing sports like football.  Stay away from dangerous activities around the house. WHEN CAN I RETURN TO NORMAL ACTIVITIES AND ATHLETICS? See your doctor before doing these activities. You should not do normal activities or play contact sports until 1 week after the following symptoms have stopped:  Headache that does not go away.  Dizziness.  Poor attention.  Confusion.  Memory problems.  Sickness to your stomach or throwing up.  Tiredness.  Fussiness.  Bothered by bright lights or loud noises.  Anxiousness or depression.  Restless sleep. MAKE SURE YOU:   Understand these instructions.  Will watch your condition.  Will get help right away if you are not doing well or get worse. Document Released: 12/22/2007 Document Revised: 05/25/2013 Document Reviewed: 09/15/2012 [facility] Patient Information 2015 [place], [place]. This information is not intended to replace advice given to you by your health care provider. Make  sure you discuss any questions you have with your health care provider.  

## 2013-09-16 NOTE — ED Provider Notes (Signed)
Medical screening examination/treatment/procedure(s) were performed by non-physician practitioner and as supervising physician I was immediately available for consultation/collaboration.   EKG Interpretation None        Houston Siren III, MD 09/16/13 1055

## 2013-09-16 NOTE — ED Notes (Addendum)
Pt coming from home with c/o of headache that has been ongoing since Wednesday 09/09/13.  Pt states she is seeing specks in the left eye and having shooting pain going down the right arm and into right neck.  Pt states the headache started after she hit herself in the head with a wrench after changing her tire.  Pt went to PCP, with diagnosis of concussion.  Pt states the headache has not improved with a 8/10 pain.    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  Pt was changing the tire on the [employer] when the wrench hit her in the head.  Pt states after she hit her head she continued to work and then went to her PCP on Thursday for evaluation.

## 2013-09-16 NOTE — Assessment & Plan Note (Signed)
Likely muscle spasm given complaint and exam. - Heat, rest, stretch - Flexeril #10. Warned re drowsiness and not to operate machinery.

## 2013-09-16 NOTE — ED Provider Notes (Signed)
CSN: [id number]     Arrival date & time 09/16/13  0488 History   First MD Initiated Contact with Patient 09/16/13 [phone]     Chief Complaint  Patient presents with  . Headache     (Consider location/radiation/quality/duration/timing/severity/associated sxs/prior Treatment) HPI Comments: Patient presents emergency department with chief complaint of headache. She states that she has had a headache for the past 5 days, since hitting herself in the head with a wrench.  She states that the headache has been a constant, throbbing headache.  She reports associated photophobia and phonophobia, as well as nausea and one episode of vomiting.  She denies any fevers or chills.  She denies any weakness or numbness in her arms or legs.  She reports seeing floaters in her left eye since hitting herself in the head.  She has been seen here previously for the same complaint and had a negative CT and workup.  She was diagnosed with concussion.  She is concerned because her symptoms have persisted.  The history is provided by the patient. No language interpreter was used.    Past Medical History  Diagnosis Date  . Tobacco abuse 09/08/2010  . OBESITY, NOS 03/21/2006        . GOITER, MULTINODULAR 09/05/2007    Dr. [facility]  [place] 2011: Stable to slightly smaller bilateral thyroid nodules since 09/04/2007. No new or enlarging thyroid nodules identified. Biopsy 08/2007: non neoplastic goiter   . Genital herpes 12/12/2011   Past Surgical History  Procedure Laterality Date  . Wisdom tooth extraction      @ age 56   Family History  Problem Relation Age of Onset  . Arthritis Mother   . Diabetes Father   . Arthritis Father   . Hypertension Father   . Hyperlipidemia Father   . Heart disease Father   . Stroke Father   . Cancer Maternal Grandfather    History  Substance Use Topics  . Smoking status: Former Smoker    Quit date: 03/31/2013  . Smokeless tobacco: Former Systems developer    Quit date:  10/28/2010  . Alcohol Use: No   OB History   Grav Para Term Preterm Abortions TAB SAB Ect Mult Living                 Review of Systems  All other systems reviewed and are negative.     Allergies  Review of patient's allergies indicates no known allergies.  Home Medications   Prior to Admission medications   Medication Sig Start Date End Date Taking? Authorizing Provider  HYDROcodone-acetaminophen (NORCO/VICODIN) 5-325 MG per tablet 1 or 2 tabs PO q6 hours prn pain 09/11/13   Francine Graven, DO  naproxen (NAPROSYN) 250 MG tablet Take 1 tablet (250 mg total) by mouth 2 (two) times daily as needed for mild pain or moderate pain (take with food). 09/11/13   Francine Graven, DO   LMP 08/19/2013 Physical Exam  Nursing note and vitals reviewed. Constitutional: She is oriented to person, place, and time. She appears well-developed and well-nourished.  HENT:  Head: Normocephalic and atraumatic.  Right Ear: External ear normal.  Left Ear: External ear normal.  Eyes: Conjunctivae and EOM are normal. Pupils are equal, round, and reactive to light.  Visual Acuity - Bilateral Near: 20/100 (uncorrected) ; R Near: 20/200 ; L Near: 20/200  Neck: Normal range of motion. Neck supple.  No pain with neck flexion, no meningismus  Cardiovascular: Normal rate, regular rhythm and normal heart sounds.  Exam reveals no gallop and no friction rub.   No murmur heard. Pulmonary/Chest: Effort normal and breath sounds normal. No respiratory distress. She has no wheezes. She has no rales. She exhibits no tenderness.  Abdominal: Soft. She exhibits no distension and no mass. There is no tenderness. There is no rebound and no guarding.  Musculoskeletal: Normal range of motion. She exhibits no edema and no tenderness.  Normal gait.  Neurological: She is alert and oriented to person, place, and time. She has normal reflexes.  CN 3-12 intact, normal finger to nose, no pronator drift, sensation and strength  intact bilaterally.  Skin: Skin is warm and dry.  Psychiatric: She has a normal mood and affect. Her behavior is normal. Judgment and thought content normal.    ED Course  Procedures (including critical care time) Labs Review Labs Reviewed - No data to display  Imaging Review No results found.   EKG Interpretation None      MDM   Final diagnoses:  Concussion, without loss of consciousness, sequela  Acute post-traumatic headache, not intractable    Patient with headache and left eye floaters.  Patient accidentally hit herself in the left forehead with a wrench about 5 days ago.  Patient had a negative CT, but has had persistent symptoms.  I suspect that the symptoms are the effects of a concussion vs post-traumatic headache.  Patient has a normal neurologic exam.  There are no deficits.  Will treat headache and check vision.  Patient discussed with Dr. Doy Mince, who agrees with the plan.  9:57 AM Patient states that her headache is significantly improved.  Tolerating PO.  DC to home.  Given concussion precautions.  Follow up with PCP.   Montine Circle, PA-C 09/16/13 [phone]

## 2013-09-16 NOTE — Progress Notes (Signed)
Patient ID: Tracy Rose, female   DOB: April 08, 1965, 48 y.o.   MRN: [id number] Subjective:   CC: Headache, nausea, imbalance  HPI:   Ms Tracy Rose is a 48 y.o. female here after being hit in the head with a wrench at work 8/19. She has had worsening 8/10 headache, nausea, photophobia, phonophobia, and imbalance since that time. She wa seen in the ED and had head CT 8/21 that was negative for acute finding. She has taken percocet which has not helped. She went to the ED again this morning and was again told this was likely concussion and by leaving ED symptoms improved. She also reports shooting pain down right arm to right neck. She has not had weakness arms or legs or numbness, but later reports she has had gait imbalance. She reports "floaters" in her vision. She also reports neck pain in the distribution of trapezius. Naproxen and vicodin have taken the edge off her pain. She feels she has been speaking slower, has a hard time focusing, has dizziness though denies fainting, and has ringing in her ears. She denies chest pain or dyspnea. She has a vague complaint of inability to use the weed whacker walking forward and only being able to walk backward with it. Denies falls.   Review of Systems - Per HPI.  Smoking status: Quit    Objective:  Physical Exam BP 143/92  Pulse 75  Temp(Src) 98 F (36.7 C) (Oral)  Wt 253 lb (114.76 kg)  LMP 09/16/2013 GEN: NAD, seated in exam room MSK: Tenderness in distribution of trapezius bilaterally with no erythema, swelling, or deformity NEURO: CN 2-12 tested and intact Normal speech Gait is mildly ataxic Finger nose finger and UE and LE cerebellar testing with heel shin uncoordinated Bilateral UE strength (biceps flexion, shoulder shrug) inconsistently weak    Assessment:     Tracy Rose is a 48 y.o. female here for evaluation after minor head trauma at work 1 week ago.    Plan:     Continued headache With ataxia, abnormal coordination on  cerebellar testing, and pt's level of concern with nausea and one episode of vomiting yesterday, favor further evaluation. Most likely still post-concussive syndrome. - MRI brain without contrast ordered and scheduled. - Mental and physical rest prescribed. - Return precautions reviewed including neurologic changes. - F/u with PCP in 2 weeks if not improving.  Neck pain Likely muscle spasm given complaint and exam. - Heat, rest, stretch - Flexeril #10. Warned re drowsiness and not to operate machinery.   Follow-up: Follow up in 2 weeks with PCP for f/u of headache and ataxia and poor coordination if not improved.  I have spent at least 25 minutes in room with patient, >50% of this time used in counseling.  Tracy Sinclair, MD [place]

## 2013-09-16 NOTE — Discharge Instructions (Signed)
Concussion  A concussion, or closed-head injury, is a brain injury caused by a direct blow to the head or by a quick and sudden movement (jolt) of the head or neck. Concussions are usually not life-threatening. Even so, the effects of a concussion can be serious. If you have had a concussion before, you are more likely to experience concussion-like symptoms after a direct blow to the head.   CAUSES  · Direct blow to the head, such as from running into another player during a soccer game, being hit in a fight, or hitting your head on a hard surface.  · A jolt of the head or neck that causes the brain to move back and forth inside the skull, such as in a car crash.  SIGNS AND SYMPTOMS  The signs of a concussion can be hard to notice. Early on, they may be missed by you, family members, and health care providers. You may look fine but act or feel differently.  Symptoms are usually temporary, but they may last for days, weeks, or even longer. Some symptoms may appear right away while others may not show up for hours or days. Every head injury is different. Symptoms include:  · Mild to moderate headaches that will not go away.  · A feeling of pressure inside your head.  · Having more trouble than usual:  ¨ Learning or remembering things you have heard.  ¨ Answering questions.  ¨ Paying attention or concentrating.  ¨ Organizing daily tasks.  ¨ Making decisions and solving problems.  · Slowness in thinking, acting or reacting, speaking, or reading.  · Getting lost or being easily confused.  · Feeling tired all the time or lacking energy (fatigued).  · Feeling drowsy.  · Sleep disturbances.  ¨ Sleeping more than usual.  ¨ Sleeping less than usual.  ¨ Trouble falling asleep.  ¨ Trouble sleeping (insomnia).  · Loss of balance or feeling lightheaded or dizzy.  · Nausea or vomiting.  · Numbness or tingling.  · Increased sensitivity to:  ¨ Sounds.  ¨ Lights.  ¨ Distractions.  · Vision problems or eyes that tire  easily.  · Diminished sense of taste or smell.  · Ringing in the ears.  · Mood changes such as feeling sad or anxious.  · Becoming easily irritated or angry for little or no reason.  · Lack of motivation.  · Seeing or hearing things other people do not see or hear (hallucinations).  DIAGNOSIS  Your health care provider can usually diagnose a concussion based on a description of your injury and symptoms. He or she will ask whether you passed out (lost consciousness) and whether you are having trouble remembering events that happened right before and during your injury.  Your evaluation might include:  · A brain scan to look for signs of injury to the brain. Even if the test shows no injury, you may still have a concussion.  · Blood tests to be sure other problems are not present.  TREATMENT  · Concussions are usually treated in an emergency department, in urgent care, or at a clinic. You may need to stay in the hospital overnight for further treatment.  · Tell your health care provider if you are taking any medicines, including prescription medicines, over-the-counter medicines, and natural remedies. Some medicines, such as blood thinners (anticoagulants) and aspirin, may increase the chance of complications. Also tell your health care provider whether you have had alcohol or are taking illegal drugs. This information   may affect treatment.  · Your health care provider will send you home with important instructions to follow.  · How fast you will recover from a concussion depends on many factors. These factors include how severe your concussion is, what part of your brain was injured, your age, and how healthy you were before the concussion.  · Most people with mild injuries recover fully. Recovery can take time. In general, recovery is slower in older persons. Also, persons who have had a concussion in the past or have other medical problems may find that it takes longer to recover from their current injury.  HOME  CARE INSTRUCTIONS  General Instructions  · Carefully follow the directions your health care provider gave you.  · Only take over-the-counter or prescription medicines for pain, discomfort, or fever as directed by your health care provider.  · Take only those medicines that your health care provider has approved.  · Do not drink alcohol until your health care provider says you are well enough to do so. Alcohol and certain other drugs may slow your recovery and can put you at risk of further injury.  · If it is harder than usual to remember things, write them down.  · If you are easily distracted, try to do one thing at a time. For example, do not try to watch TV while fixing dinner.  · Talk with family members or close friends when making important decisions.  · Keep all follow-up appointments. Repeated evaluation of your symptoms is recommended for your recovery.  · Watch your symptoms and tell others to do the same. Complications sometimes occur after a concussion. Older adults with a brain injury may have a higher risk of serious complications, such as a blood clot on the brain.  · Tell your teachers, school nurse, school counselor, coach, athletic trainer, or work manager about your injury, symptoms, and restrictions. Tell them about what you can or cannot do. They should watch for:  ¨ Increased problems with attention or concentration.  ¨ Increased difficulty remembering or learning new information.  ¨ Increased time needed to complete tasks or assignments.  ¨ Increased irritability or decreased ability to cope with stress.  ¨ Increased symptoms.  · Rest. Rest helps the brain to heal. Make sure you:  ¨ Get plenty of sleep at night. Avoid staying up late at night.  ¨ Keep the same bedtime hours on weekends and weekdays.  ¨ Rest during the day. Take daytime naps or rest breaks when you feel tired.  · Limit activities that require a lot of thought or concentration. These include:  ¨ Doing homework or job-related  work.  ¨ Watching TV.  ¨ Working on the computer.  · Avoid any situation where there is potential for another head injury (football, hockey, soccer, basketball, martial arts, downhill snow sports and horseback riding). Your condition will get worse every time you experience a concussion. You should avoid these activities until you are evaluated by the appropriate follow-up health care providers.  Returning To Your Regular Activities  You will need to return to your normal activities slowly, not all at once. You must give your body and brain enough time for recovery.  · Do not return to sports or other athletic activities until your health care provider tells you it is safe to do so.  · Ask your health care provider when you can drive, ride a bicycle, or operate heavy machinery. Your ability to react may be slower after a   brain injury. Never do these activities if you are dizzy.  · Ask your health care provider about when you can return to work or school.  Preventing Another Concussion  It is very important to avoid another brain injury, especially before you have recovered. In rare cases, another injury can lead to permanent brain damage, brain swelling, or death. The risk of this is greatest during the first 7-10 days after a head injury. Avoid injuries by:  · Wearing a seat belt when riding in a car.  · Drinking alcohol only in moderation.  · Wearing a helmet when biking, skiing, skateboarding, skating, or doing similar activities.  · Avoiding activities that could lead to a second concussion, such as contact or recreational sports, until your health care provider says it is okay.  · Taking safety measures in your home.  ¨ Remove clutter and tripping hazards from floors and stairways.  ¨ Use grab bars in bathrooms and handrails by stairs.  ¨ Place non-slip mats on floors and in bathtubs.  ¨ Improve lighting in dim areas.  SEEK MEDICAL CARE IF:  · You have increased problems paying attention or  concentrating.  · You have increased difficulty remembering or learning new information.  · You need more time to complete tasks or assignments than before.  · You have increased irritability or decreased ability to cope with stress.  · You have more symptoms than before.  Seek medical care if you have any of the following symptoms for more than 2 weeks after your injury:  · Lasting (chronic) headaches.  · Dizziness or balance problems.  · Nausea.  · Vision problems.  · Increased sensitivity to noise or light.  · Depression or mood swings.  · Anxiety or irritability.  · Memory problems.  · Difficulty concentrating or paying attention.  · Sleep problems.  · Feeling tired all the time.  SEEK IMMEDIATE MEDICAL CARE IF:  · You have severe or worsening headaches. These may be a sign of a blood clot in the brain.  · You have weakness (even if only in one hand, leg, or part of the face).  · You have numbness.  · You have decreased coordination.  · You vomit repeatedly.  · You have increased sleepiness.  · One pupil is larger than the other.  · You have convulsions.  · You have slurred speech.  · You have increased confusion. This may be a sign of a blood clot in the brain.  · You have increased restlessness, agitation, or irritability.  · You are unable to recognize people or places.  · You have neck pain.  · It is difficult to wake you up.  · You have unusual behavior changes.  · You lose consciousness.  MAKE SURE YOU:  · Understand these instructions.  · Will watch your condition.  · Will get help right away if you are not doing well or get worse.  Document Released: 03/31/2003 Document Revised: 01/13/2013 Document Reviewed: 07/31/2012  Exit[employer]® Patient Information ©2015 [employer]. This information is not intended to replace advice given to you by your health care provider. Make sure you discuss any questions you have with your health care provider.

## 2013-09-21 ENCOUNTER — Encounter: Payer: Self-pay | Admitting: *Deleted

## 2013-09-22 ENCOUNTER — Ambulatory Visit (HOSPITAL_COMMUNITY)
Admission: RE | Admit: 2013-09-22 | Discharge: 2013-09-22 | Disposition: A | Discharge status: Home / Self Care | Payer: Medicaid Other | Source: Ambulatory Visit | Attending: Family Medicine | Admitting: Family Medicine

## 2013-09-22 ENCOUNTER — Ambulatory Visit: Payer: Medicaid Other | Admitting: Family Medicine

## 2013-09-22 DIAGNOSIS — F0781 Postconcussional syndrome: Secondary | ICD-10-CM

## 2013-09-22 DIAGNOSIS — N8500 Endometrial hyperplasia, unspecified: Secondary | ICD-10-CM | POA: Insufficient documentation

## 2013-09-24 ENCOUNTER — Telehealth: Payer: Self-pay | Admitting: Family Medicine

## 2013-09-24 DIAGNOSIS — N8500 Endometrial hyperplasia, unspecified: Secondary | ICD-10-CM

## 2013-09-24 NOTE — Telephone Encounter (Signed)
Message copied by Bernadene Bell on Thu Sep 24, 2013  8:41 AM ------      Message from: Leone Brand      Created: Tue Sep 22, 2013  3:44 PM       Not sure if it is going to forward or not, but this was supposed to be a result for one of the patient's you had seen that needed repeat TVUS after her period.            ----- Message -----         From: Rad Results In Interface         Sent: 09/22/2013   3:08 PM           To: Leone Brand, MD                   ------

## 2013-09-24 NOTE — Telephone Encounter (Signed)
Needs to come to women's clinic either at our office or [facility] for endometrial biopsy [facility], MD

## 2013-09-25 NOTE — Telephone Encounter (Signed)
LM for patient to call back.  Do you want [place] to send her a letter with information and her need for an appt?  Kassidie Hendriks,CMA

## 2013-09-25 NOTE — Telephone Encounter (Signed)
Yes I will make letter now. [facility], MD

## 2013-09-25 NOTE — Telephone Encounter (Signed)
Please let her know that although the thickness of her lining looks better it is probably still too thick than what we would expect. And given that she is >75years old it is worthwhile to have an endometrial biopsy taken to ensure that she does not have atypical cell growth.   I am happy to answer further questions for her however I have never been able to get a hold of her over the phone  [facility], MD

## 2013-09-25 NOTE — Telephone Encounter (Signed)
Letter mailed to patient. Rook Maue,CMA  

## 2013-09-25 NOTE — Telephone Encounter (Signed)
What would you like me to tell her are the results of her [place]?  Jazmin Hartsell,CMA

## 2013-10-12 ENCOUNTER — Encounter: Payer: Self-pay | Admitting: *Deleted

## 2013-10-13 ENCOUNTER — Encounter (HOSPITAL_COMMUNITY): Payer: Self-pay | Admitting: Emergency Medicine

## 2013-10-13 ENCOUNTER — Emergency Department (HOSPITAL_COMMUNITY)
Admission: EM | Admit: 2013-10-13 | Discharge: 2013-10-13 | Discharge status: Against Medical Advice | Payer: Medicaid Other | Attending: Emergency Medicine | Admitting: Emergency Medicine

## 2013-10-13 ENCOUNTER — Ambulatory Visit: Payer: Medicaid Other | Admitting: Family Medicine

## 2013-10-13 DIAGNOSIS — M542 Cervicalgia: Secondary | ICD-10-CM | POA: Diagnosis not present

## 2013-10-13 DIAGNOSIS — R51 Headache: Secondary | ICD-10-CM | POA: Diagnosis present

## 2013-10-13 DIAGNOSIS — Z8619 Personal history of other infectious and parasitic diseases: Secondary | ICD-10-CM | POA: Insufficient documentation

## 2013-10-13 DIAGNOSIS — G44319 Acute post-traumatic headache, not intractable: Secondary | ICD-10-CM | POA: Diagnosis not present

## 2013-10-13 DIAGNOSIS — E669 Obesity, unspecified: Secondary | ICD-10-CM | POA: Insufficient documentation

## 2013-10-13 DIAGNOSIS — Z79899 Other long term (current) drug therapy: Secondary | ICD-10-CM | POA: Diagnosis not present

## 2013-10-13 DIAGNOSIS — Z87891 Personal history of nicotine dependence: Secondary | ICD-10-CM | POA: Diagnosis not present

## 2013-10-13 MED ORDER — DIAZEPAM 5 MG/ML IJ SOLN: 2.5000 mg | Freq: Once | INTRAMUSCULAR | Status: DC

## 2013-10-13 MED ORDER — DIPHENHYDRAMINE HCL 50 MG/ML IJ SOLN: 25.0000 mg | Freq: Once | INTRAMUSCULAR | Status: DC

## 2013-10-13 MED ORDER — KETOROLAC TROMETHAMINE 30 MG/ML IJ SOLN: 30.0000 mg | Freq: Once | INTRAMUSCULAR | Status: DC

## 2013-10-13 MED ORDER — METOCLOPRAMIDE HCL 5 MG/ML IJ SOLN
10.0000 mg | Freq: Once | INTRAMUSCULAR | Status: AC
  Administered 2013-10-13: 10 mg via INTRAVENOUS
  Filled 2013-10-13: qty 2

## 2013-10-13 MED ORDER — DIAZEPAM 5 MG/ML IJ SOLN: 5.0000 mg | Freq: Once | INTRAMUSCULAR | Status: DC
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
## 2013-10-13 NOTE — ED Notes (Signed)
Went into room to check on pt. Pt. Gone. Iv laying on the floor.  Pt.s clothes and papers are gone.  Checked the waiting room and bathroom, Pt. Is not in any of them. Staff aware
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
## 2013-10-13 NOTE — ED Provider Notes (Signed)
Medical screening examination/treatment/procedure(s) were conducted as a shared visit with non-physician practitioner(s) and myself.  I personally evaluated the patient during the encounter.   EKG Interpretation None       Orlie Dakin, MD 10/13/13 1625
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
## 2013-10-16 ENCOUNTER — Other Ambulatory Visit: Payer: Self-pay | Admitting: Family Medicine

## 2013-10-16 ENCOUNTER — Encounter: Payer: Self-pay | Admitting: Family Medicine

## 2013-10-16 MED ORDER — ACYCLOVIR 200 MG PO CAPS: 200.0000 mg | ORAL_CAPSULE | Freq: Every day | ORAL | Status: DC

## 2013-10-16 NOTE — Progress Notes (Signed)
Pt came in stating that she needs refill on Acyclovir sent to pharmacy, and stating she needs asap.

## 2013-10-16 NOTE — Progress Notes (Signed)
Patient ID: Tracy Rose, female   DOB: 11-21-1965, 48 y.o.   MRN: [id number] Refill for acyclovir sent to pharmacy.

## 2013-10-28 ENCOUNTER — Encounter: Payer: Self-pay | Admitting: *Deleted

## 2013-10-28 ENCOUNTER — Telehealth: Payer: Self-pay | Admitting: *Deleted

## 2013-10-28 ENCOUNTER — Encounter: Payer: Medicaid Other | Admitting: Obstetrics & Gynecology

## 2013-10-28 NOTE — Telephone Encounter (Signed)
Attempted to contact patient, no answer, left message concerning missed appointment and requested patient to call clinic and reschedule.  Will send letter.  Letter sent.

## 2013-11-10 ENCOUNTER — Encounter: Payer: Self-pay | Admitting: Family Medicine

## 2013-11-10 ENCOUNTER — Ambulatory Visit (INDEPENDENT_AMBULATORY_CARE_PROVIDER_SITE_OTHER): Payer: Medicaid Other | Admitting: Family Medicine

## 2013-11-10 VITALS — BP 128/92 | HR 91 | Temp 98.1°F | Ht 65.5 in | Wt 247.0 lb

## 2013-11-10 DIAGNOSIS — G44329 Chronic post-traumatic headache, not intractable: Secondary | ICD-10-CM

## 2013-11-10 DIAGNOSIS — M542 Cervicalgia: Secondary | ICD-10-CM

## 2013-11-10 MED ORDER — ZOLPIDEM TARTRATE 5 MG PO TABS: 5.0000 mg | ORAL_TABLET | Freq: Once | ORAL | Status: DC

## 2013-11-10 MED ORDER — TRAMADOL HCL 50 MG PO TABS
50.0000 mg | ORAL_TABLET | Freq: Three times a day (TID) | ORAL | Status: DC | PRN
Start: 2013-11-10 — End: 2013-12-19

## 2013-11-10 NOTE — Progress Notes (Signed)
Subjective:     Patient ID: Tracy Rose, female   DOB: 08/05/1965, 48 y.o.   MRN: [id number]  Headache  This is a chronic problem. The current episode started more than 1 month ago (S/P head trauma more than 2 months ago). The problem occurs daily. The problem has been waxing and waning. The pain is located in the left unilateral and frontal region. The pain radiates to the left neck. The pain quality is similar to prior headaches. The quality of the pain is described as aching and throbbing. The pain is at a severity of 7/10. The pain is moderate. Associated symptoms include neck pain, phonophobia and photophobia. Pertinent negatives include no blurred vision, dizziness, eye pain, eye redness, fever, numbness, seizures, visual change, vomiting or weakness. Associated symptoms comments: Worsened by light and noise. The symptoms are aggravated by bright light, emotional stress and noise. She has tried NSAIDs and acetaminophen for the symptoms. The treatment provided no relief. Her past medical history is significant for recent head traumas. There is no history of migraine headaches. (Trauma to left side of her head about 2 months ago)  Neck Pain  This is a chronic problem. The current episode started more than 1 month ago. The problem occurs constantly. The problem has been waxing and waning. Associated with: Movement. The quality of the pain is described as aching. The pain is at a severity of 5/10. The pain is moderate. The symptoms are aggravated by position. The pain is same all the time. Stiffness is present: No stiffness. Associated symptoms include headaches and photophobia. Pertinent negatives include no fever, numbness, visual change or weakness. She has tried muscle relaxants and NSAIDs for the symptoms. The treatment provided no relief.   Current Outpatient Prescriptions on File Prior to Visit  Medication Sig Dispense Refill  . Iron-Vitamins (GERITOL PO) Take 30 mLs by mouth daily.      Marland Kitchen  acyclovir (ZOVIRAX) 200 MG capsule Take 1 capsule (200 mg total) by mouth 5 (five) times daily.  25 capsule  1  . cyclobenzaprine (FLEXERIL) 5 MG tablet Take 1 tablet (5 mg total) by mouth daily as needed for muscle spasms.  10 tablet  0  . diphenhydrAMINE (BENADRYL) 25 MG tablet Take 1 tablet (25 mg total) by mouth every 6 (six) hours as needed for itching (Rash).  30 tablet  0   No current facility-administered medications on file prior to visit.   Past Medical History  Diagnosis Date  . Tobacco abuse 09/08/2010  . OBESITY, NOS 03/21/2006        . GOITER, MULTINODULAR 09/05/2007    Dr. Zada [facility]  [place] 2011: Stable to slightly smaller bilateral thyroid nodules since 09/04/2007. No new or enlarging thyroid nodules identified. Biopsy 08/2007: non neoplastic goiter   . Genital herpes 12/12/2011     Review of Systems  Constitutional: Negative for fever.  Eyes: Positive for photophobia. Negative for blurred vision, pain and redness.  Respiratory: Negative.   Cardiovascular: Negative.   Gastrointestinal: Negative.  Negative for vomiting.  Musculoskeletal: Positive for neck pain.  Neurological: Positive for headaches. Negative for dizziness, tremors, seizures, syncope, weakness and numbness.  All other systems reviewed and are negative.  Filed Vitals:   11/10/13 1142  BP: 128/92  Pulse: 91  Temp: 98.1 F (36.7 C)  TempSrc: Oral  Height: 5' 5.5" (1.664 m)  Weight: 247 lb (112.038 kg)        Objective:   Physical Exam  Nursing note and  vitals reviewed. Constitutional: She is oriented to person, place, and time. She appears well-developed. No distress.  Neck: Muscular tenderness present. No rigidity. Decreased range of motion present. No edema and no erythema present. No Brudzinski's sign and no Kernig's sign noted.  Cardiovascular: Normal rate, regular rhythm, normal heart sounds and intact distal pulses.   No murmur heard. Pulmonary/Chest: Effort normal and  breath sounds normal. No respiratory distress. She has no wheezes.  Abdominal: Soft. Bowel sounds are normal. She exhibits no distension and no mass. There is no tenderness.  Neurological: She is alert and oriented to person, place, and time. She has normal strength and normal reflexes. She displays normal reflexes. No cranial nerve deficit. She exhibits normal muscle tone. Coordination normal.  Psychiatric: She has a normal mood and affect.       Assessment:     Chronic headache Neck pain     Plan:     Check problem list.      Total time spent for this face to face encounter and coordination of care was more than 25 min.

## 2013-11-10 NOTE — Assessment & Plan Note (Addendum)
Thought to be post concussion headache but this is not resolving. She missed MRI brain last week due to claustrophobia. MRI brain re-ordered,we called radiology to reschedule during this visit but was not successful with that. Patient advised to call for rescheduling. Phone number given. Tramadol prescribed prn HA since no improvement with NSAID and Tylenol. Consider prophylactic treatment with Topamax if no improvement. Neurology referral done at this time as well.
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
## 2013-11-10 NOTE — Assessment & Plan Note (Addendum)
Likely related to head trauma. Patient still symptomatic. Tramadol prescribed prn pain. Xray neck ordered. PT recommended but uncertain this will be covered by her insurance. Home exercise instruction was given.

## 2013-11-13 ENCOUNTER — Ambulatory Visit (INDEPENDENT_AMBULATORY_CARE_PROVIDER_SITE_OTHER): Payer: Medicaid Other | Admitting: Neurology

## 2013-11-13 ENCOUNTER — Encounter: Payer: Self-pay | Admitting: Neurology

## 2013-11-13 VITALS — BP 129/94 | HR 77 | Temp 97.8°F | Ht 65.5 in | Wt 246.5 lb

## 2013-11-13 DIAGNOSIS — G4452 New daily persistent headache (NDPH): Secondary | ICD-10-CM

## 2013-11-13 DIAGNOSIS — I7774 Dissection of vertebral artery: Secondary | ICD-10-CM

## 2013-11-13 MED ORDER — AMITRIPTYLINE HCL 25 MG PO TABS: 25.0000 mg | ORAL_TABLET | Freq: Every day | ORAL | Status: DC

## 2013-11-13 MED ORDER — DICLOFENAC POTASSIUM(MIGRAINE) 50 MG PO PACK: 50.0000 mg | PACK | Freq: Once | ORAL | Status: DC | PRN
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
## 2013-11-13 NOTE — Patient Instructions (Addendum)
Overall you are doing fairly well but I do want to suggest a few things today:   Remember to drink plenty of fluid, eat healthy meals and do not skip any meals. Try to eat protein with a every meal and eat a healthy snack such as fruit or nuts in between meals. Try to keep a regular sleep-wake schedule and try to exercise daily, particularly in the form of walking, 20-30 minutes a day, if you can.   As far as your medications are concerned, I would like to suggest: Cambia for severe headache up to one times a day, amitriptyline at night, tramadol prn  As far as diagnostic testing: MRI brain and MRA neck   I would like to see you back in 3 months, sooner if we need to. Please call us with any interim questions, concerns, problems, updates or refill requests.   Please also call us for any test results so we can go over those with you on the phone.  My clinical assistant and will answer any of your questions and relay your messages to me and also relay most of my messages to you.   Our phone number is [phone]. We also have an after hours call service for urgent matters and there is a physician on-call for urgent questions. For any emergencies you know to call 911 or go to the nearest emergency room

## 2013-11-16 ENCOUNTER — Encounter: Payer: Self-pay | Admitting: Neurology

## 2013-11-18 ENCOUNTER — Ambulatory Visit (HOSPITAL_COMMUNITY)
Admission: RE | Admit: 2013-11-18 | Discharge: 2013-11-18 | Disposition: A | Discharge status: Home / Self Care | Payer: Medicaid Other | Source: Ambulatory Visit | Attending: Family Medicine | Admitting: Family Medicine

## 2013-11-18 ENCOUNTER — Other Ambulatory Visit: Payer: Self-pay | Admitting: Family Medicine

## 2013-11-18 DIAGNOSIS — W1830XA Fall on same level, unspecified, initial encounter: Secondary | ICD-10-CM | POA: Diagnosis not present

## 2013-11-18 DIAGNOSIS — M479 Spondylosis, unspecified: Secondary | ICD-10-CM | POA: Insufficient documentation

## 2013-11-18 DIAGNOSIS — G44329 Chronic post-traumatic headache, not intractable: Secondary | ICD-10-CM

## 2013-11-18 DIAGNOSIS — R51 Headache: Secondary | ICD-10-CM | POA: Insufficient documentation

## 2013-11-18 DIAGNOSIS — M542 Cervicalgia: Secondary | ICD-10-CM

## 2013-11-19 ENCOUNTER — Other Ambulatory Visit (INDEPENDENT_AMBULATORY_CARE_PROVIDER_SITE_OTHER): Payer: Self-pay

## 2013-11-19 ENCOUNTER — Other Ambulatory Visit: Payer: Self-pay | Admitting: Neurology

## 2013-11-19 DIAGNOSIS — I7774 Dissection of vertebral artery: Secondary | ICD-10-CM | POA: Insufficient documentation

## 2013-11-19 DIAGNOSIS — Z0289 Encounter for other administrative examinations: Secondary | ICD-10-CM

## 2013-11-19 DIAGNOSIS — R42 Dizziness and giddiness: Secondary | ICD-10-CM

## 2013-11-20 ENCOUNTER — Encounter: Payer: Self-pay | Admitting: Family Medicine

## 2013-11-20 LAB — BASIC METABOLIC PANEL
BUN/Creatinine Ratio: 10 (ref 9–23)
BUN: 13 mg/dL (ref 6–24)
CHLORIDE: 103 mmol/L (ref 97–108)
CO2: 22 mmol/L (ref 18–29)
CREATININE: 1.29 mg/dL — AB (ref 0.57–1.00)
Calcium: 8.9 mg/dL (ref 8.7–10.2)
GFR, EST AFRICAN AMERICAN: 57 mL/min/{1.73_m2} — AB (ref >59)
GFR, EST NON AFRICAN AMERICAN: 50 mL/min/{1.73_m2} — AB (ref >59)
GLUCOSE: 89 mg/dL (ref 65–99)
Potassium: 4.5 mmol/L (ref 3.5–5.2)
Sodium: 140 mmol/L (ref 134–144)

## 2013-11-24 ENCOUNTER — Ambulatory Visit: Payer: Self-pay | Admitting: Family Medicine

## 2013-11-29 ENCOUNTER — Other Ambulatory Visit: Payer: Self-pay

## 2013-12-02 ENCOUNTER — Encounter: Payer: Self-pay | Admitting: Family Medicine

## 2013-12-02 NOTE — Progress Notes (Signed)
LM for patient to call back.  Please inform that we prefer she not change her MRI scheduled for tomorrow.  We don't want something to happen in the meantime and it could have been prevented.  Pt needs to try and make tomorrows appt since the authorization expires on 12/10/13.  We can only get these authorized 30 days in advance.  Pt has the choice to reschedule her own scan.  Advise her to keep upcoming appt with Dr. Gwendlyn Deutscher to discuss this again. Jazmin Hartsell,CMA

## 2013-12-02 NOTE — Progress Notes (Unsigned)
Patient is wanting to have her mri done at [facility] on January 5 because her neurologist scheduled her a MRA on that date.  She will be semi-sedated on that date.

## 2013-12-03 ENCOUNTER — Other Ambulatory Visit: Payer: Self-pay

## 2013-12-03 ENCOUNTER — Inpatient Hospital Stay: Admission: RE | Admit: 2013-12-03 | Payer: Self-pay | Source: Ambulatory Visit

## 2013-12-15 ENCOUNTER — Encounter: Payer: Self-pay | Admitting: Family Medicine

## 2013-12-15 ENCOUNTER — Ambulatory Visit (INDEPENDENT_AMBULATORY_CARE_PROVIDER_SITE_OTHER): Payer: Medicaid Other | Admitting: Family Medicine

## 2013-12-15 VITALS — BP 129/92 | HR 85 | Temp 97.6°F | Ht 66.0 in | Wt 240.0 lb

## 2013-12-15 DIAGNOSIS — F320 Major depressive disorder, single episode, mild: Secondary | ICD-10-CM

## 2013-12-15 DIAGNOSIS — M542 Cervicalgia: Secondary | ICD-10-CM

## 2013-12-15 DIAGNOSIS — F32A Depression, unspecified: Secondary | ICD-10-CM

## 2013-12-15 DIAGNOSIS — G44329 Chronic post-traumatic headache, not intractable: Secondary | ICD-10-CM

## 2013-12-15 DIAGNOSIS — F329 Major depressive disorder, single episode, unspecified: Secondary | ICD-10-CM

## 2013-12-15 HISTORY — DX: Depression, unspecified: F32.A

## 2013-12-15 HISTORY — DX: Major depressive disorder, single episode, mild: F32.0

## 2013-12-15 MED ORDER — VENLAFAXINE HCL ER 37.5 MG PO CP24: 37.5000 mg | ORAL_CAPSULE | Freq: Every day | ORAL | Status: DC

## 2013-12-15 NOTE — Assessment & Plan Note (Signed)
Continue Tramadol prn pain. F/U MRI/MRA report. F/U neurology as instructed.

## 2013-12-15 NOTE — Patient Instructions (Addendum)
It was nice seeing you today, sorry you still have headache, but glad this improved with some of the new medication started by your neurologist. I will complete your disability paper work today and get it faxed. Come in to see me soon if having any problem. We will change your Amitriptyline to effexor which will help with headache and depression

## 2013-12-15 NOTE — Assessment & Plan Note (Signed)
Not suicidal or homicidal. This might be related to stressful event with trauma to her head and chronic headache. Effexor started which will help with HA and depression. D/C Amytriptyline since she self discontinued it due to intolerance.
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
## 2013-12-15 NOTE — Progress Notes (Signed)
Subjective:     Patient ID: Tracy Rose, female   DOB: 1965/05/28, 48 y.o.   MRN: [id number]  HPI  Headache:Still having headache which has improved only a little on Amitriptyline,she does not like to take Amitriptyline regularly since it makes her feel lousy for at least 2 days. She has been taking a lot of tylenol as well as tramadol with minimal improvement. Headache can occur at anytime during the day. This is impacting her work as she is unable to focus and concentrate. Her work makes her headache worse. Neck pain: Stiil having neck pain radiating to her left arm with shooting pain at times, she denies any arm numbness or weakness, she is scheduled for MRA in jan as ordered by her neurologist due to hx of neck injury as well. Depression:Here for screening. Feels down, depressed and hopeless at times but not suicidal.  Current Outpatient Prescriptions on File Prior to Visit  Medication Sig Dispense Refill  . acetaminophen (TYLENOL) 325 MG tablet Take 650 mg by mouth every 6 (six) hours as needed.    . Diclofenac Potassium 50 MG PACK Take 50 mg by mouth once as needed. Please take with food 30 each 3  . traMADol (ULTRAM) 50 MG tablet Take 1 tablet (50 mg total) by mouth every 8 (eight) hours as needed. 30 tablet 0  . vitamin B-12 (CYANOCOBALAMIN) 100 MCG tablet Take 100 mcg by mouth daily.    . diphenhydrAMINE (BENADRYL) 25 MG tablet Take 1 tablet (25 mg total) by mouth every 6 (six) hours as needed for itching (Rash). (Patient not taking: Reported on 12/15/2013) 30 tablet 0  . zolpidem (AMBIEN) 5 MG tablet Take 1 tablet (5 mg total) by mouth once. Take 15 min before MRI 2 tablet 0   No current facility-administered medications on file prior to visit.   Past Medical History  Diagnosis Date  . Tobacco abuse 09/08/2010  . OBESITY, NOS 03/21/2006        . GOITER, MULTINODULAR 09/05/2007    Dr. Zada Girt [facility]  [place] 2011: Stable to slightly smaller bilateral thyroid nodules  since 09/04/2007. No new or enlarging thyroid nodules identified. Biopsy 08/2007: non neoplastic goiter   . Genital herpes 12/12/2011      Review of Systems  Respiratory: Negative.   Cardiovascular: Negative.   Gastrointestinal: Negative.   Musculoskeletal:       Neck pain  Neurological: Positive for headaches. Negative for seizures.  All other systems reviewed and are negative.  Filed Vitals:   12/15/13 0957  BP: 129/92  Pulse: 85  Temp: 97.6 F (36.4 C)  TempSrc: Oral  Height: 5\' 6"  (1.676 m)  Weight: 240 lb (108.863 kg)       Objective:   Physical Exam  Constitutional: She is oriented to person, place, and time. She appears well-developed. No distress.  Cardiovascular: Normal rate, regular rhythm and normal heart sounds.   No murmur heard. Pulmonary/Chest: Effort normal and breath sounds normal. No respiratory distress. She has no wheezes.  Abdominal: Soft. Bowel sounds are normal. She exhibits no distension and no mass. There is no tenderness.  Musculoskeletal: Normal range of motion. She exhibits no edema.       Cervical back: She exhibits normal range of motion, no swelling and no deformity.  Neurological: She is alert and oriented to person, place, and time. She has normal strength. No cranial nerve deficit or sensory deficit.  Nursing note and vitals reviewed.      Assessment:  Headache: Neck pain: left arm Depression:    Plan:     Check problem list.

## 2013-12-19 ENCOUNTER — Other Ambulatory Visit: Payer: Self-pay | Admitting: Family Medicine

## 2013-12-21 ENCOUNTER — Other Ambulatory Visit: Payer: Self-pay | Admitting: Family Medicine

## 2013-12-21 NOTE — Telephone Encounter (Signed)
LM for patient to call back. Kayia Billinger,CMA  

## 2013-12-21 NOTE — Telephone Encounter (Signed)
Please call to check with patient if she has been taking acyclovir daily, has she been having herpes rash recently?

## 2013-12-21 NOTE — Telephone Encounter (Signed)
Tramadol called in by me on 12/21/13 at 9:12am.

## 2013-12-22 NOTE — Telephone Encounter (Signed)
Please have patient come in to discuss need for Acyclovir.

## 2013-12-22 NOTE — Telephone Encounter (Signed)
LM for patient to call back.  Please schedule an appt with her pcp to discuss medication refill.  Thanks [employer]

## 2013-12-23 ENCOUNTER — Other Ambulatory Visit (HOSPITAL_COMMUNITY)
Admission: RE | Admit: 2013-12-23 | Discharge: 2013-12-23 | Disposition: A | Discharge status: Home / Self Care | Payer: Medicaid Other | Source: Ambulatory Visit | Attending: Obstetrics & Gynecology | Admitting: Obstetrics & Gynecology

## 2013-12-23 ENCOUNTER — Encounter: Payer: Self-pay | Admitting: Family Medicine

## 2013-12-23 ENCOUNTER — Ambulatory Visit (INDEPENDENT_AMBULATORY_CARE_PROVIDER_SITE_OTHER): Payer: Medicaid Other | Admitting: Family Medicine

## 2013-12-23 VITALS — BP 130/86 | HR 93 | Temp 98.1°F | Ht 65.0 in | Wt 244.0 lb

## 2013-12-23 DIAGNOSIS — N920 Excessive and frequent menstruation with regular cycle: Secondary | ICD-10-CM | POA: Diagnosis present

## 2013-12-23 DIAGNOSIS — N921 Excessive and frequent menstruation with irregular cycle: Secondary | ICD-10-CM

## 2013-12-23 LAB — POCT PREGNANCY, URINE: PREG TEST UR: NEGATIVE

## 2013-12-23 NOTE — Progress Notes (Signed)
Mason Jim, LPN-- Triage Complete

## 2013-12-23 NOTE — Progress Notes (Signed)
   Subjective:    Patient ID: Tracy Rose, female    DOB: 10/28/1965, 48 y.o.   MRN: [id number]  HPI This is a 48 year old G4 P2 021 who was referred to our clinic from her primary care physician due to menorrhagia with irregular cycles. She started having periods when she was 48 years old with 28-30 days between cycles and would bleed for proximally 5 days. In July, she started having periods about every 1-2 weeks. Her flow at last for 5-7 days, then stop for several days and would return. After 2-3 months of this, she had a month without a period, they continue to have irregular periods. She had some workup by her primary doctor. She had an ultrasound that initially showed an endometrial stripe of 18 mm. A repeat ultrasound showed decreasing to 13 mm. There is no evidence of fibroid. Additionally, she had TSH and hemoglobin A1c, both of which were normal.  Does admit to hotflashes for past year.   Review of Systems  Constitutional: Negative for fever, chills and fatigue.  Respiratory: Negative for cough, shortness of breath and wheezing.   Cardiovascular: Negative for chest pain, palpitations and leg swelling.  Gastrointestinal: Negative for nausea, vomiting, abdominal pain, diarrhea, constipation and blood in stool.  Endocrine: Negative for cold intolerance, heat intolerance, polydipsia, polyphagia and polyuria.  Genitourinary: Negative for urgency, decreased urine volume, vaginal discharge, vaginal pain and pelvic pain.  Neurological: Negative for dizziness and headaches.  All other systems reviewed and are negative.      Objective:   Physical Exam  Constitutional: She is oriented to person, place, and time. She appears well-developed and well-nourished.  HENT:  Head: Normocephalic and atraumatic.  Pulmonary/Chest: Effort normal and breath sounds normal.  Abdominal: Soft. Bowel sounds are normal. She exhibits no distension and no mass. There is no tenderness. There is no rebound and  no guarding. Hernia confirmed negative in the right inguinal area and confirmed negative in the left inguinal area.  Genitourinary: There is no rash, tenderness, lesion or injury on the right labia. There is no rash, tenderness, lesion or injury on the left labia. Uterus is not deviated, not enlarged, not fixed and not tender. Cervix exhibits no motion tenderness, no discharge and no friability. Right adnexum displays no mass, no tenderness and no fullness. Left adnexum displays no mass, no tenderness and no fullness. No erythema, tenderness or bleeding in the vagina. No foreign body around the vagina. No signs of injury around the vagina. No vaginal discharge found.  Musculoskeletal: She exhibits no edema.  Lymphadenopathy:       Right: No inguinal adenopathy present.       Left: No inguinal adenopathy present.  Neurological: She is alert and oriented to person, place, and time.  Skin: Skin is warm and dry. No rash noted. No erythema. No pallor.  Psychiatric: She has a normal mood and affect. Her behavior is normal. Judgment and thought content normal.      Assessment & Plan:   Problem List Items Addressed This Visit    Menorrhagia - Primary   Relevant Orders      Surgical pathology     Endometrial biopsy done today due to increased bleeding to r/o endometrial cancer.  Will call patient with results.

## 2013-12-28 ENCOUNTER — Encounter: Payer: Self-pay | Admitting: Family Medicine

## 2013-12-28 ENCOUNTER — Telehealth: Payer: Self-pay | Admitting: Neurology

## 2013-12-28 ENCOUNTER — Telehealth: Payer: Self-pay | Admitting: Family Medicine

## 2013-12-28 NOTE — Telephone Encounter (Signed)
Patient is calling to get a note to be excused from work  from 11-13-13 to 01-26-14. Patient states she needs this note by 3pm tomorrow. Please call patient when ready for pickup. It is ok to leave a message.Thank you.

## 2013-12-28 NOTE — Progress Notes (Signed)
Quick Note:  Patient notified of normal results. Does not wish to do anything at this point. ______

## 2013-12-28 NOTE — Telephone Encounter (Signed)
Ms. Bodi need work note that goes back from Sept 22 of 2015 thru  Mar 11 2014.  Need to have this by 3:00 tomorrow. Have already applied for her STD.

## 2013-12-28 NOTE — Telephone Encounter (Signed)
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 Patient has been out of work since Sept and thought that her previous physician had taken her out of work prior to her appt her to see Dr. Jaynee Eagles. Patient received a call Friday from her job stating that they did not have a note on file and patient needed to get a note from her physician stating that she needed to be out of work and they need this by tomorrow 3pm.

## 2013-12-28 NOTE — Telephone Encounter (Signed)
Pt is aware that note is ready that explains her headaches.  She asked if it is to keep her out and I informed her that it wasn't.  Pt didn't give any information as to whether or not she was going to pick it up.  I offered to fax this but she wasn't interested. Jazmin Hartsell,CMA

## 2013-12-28 NOTE — Telephone Encounter (Signed)
I never approved or discussed work leave with this patient. I cannot provide the requested note. Please let patient know, thank you.

## 2013-12-28 NOTE — Telephone Encounter (Signed)
Letter ready for pick up

## 2013-12-28 NOTE — Telephone Encounter (Signed)
Tried calling patient but phone keeps going to voice mail, will try again later.

## 2013-12-28 NOTE — Telephone Encounter (Signed)
Will forward to MD. Jazmin Hartsell,CMA  

## 2013-12-29 ENCOUNTER — Encounter: Payer: Self-pay | Admitting: Family Medicine

## 2014-01-08 ENCOUNTER — Encounter: Payer: Self-pay | Admitting: Family Medicine

## 2014-01-08 ENCOUNTER — Ambulatory Visit (INDEPENDENT_AMBULATORY_CARE_PROVIDER_SITE_OTHER): Payer: Medicaid Other | Admitting: Family Medicine

## 2014-01-08 ENCOUNTER — Encounter: Payer: Self-pay | Admitting: *Deleted

## 2014-01-08 VITALS — BP 141/96 | HR 79 | Temp 98.1°F | Ht 65.0 in | Wt 243.0 lb

## 2014-01-08 DIAGNOSIS — F32A Depression, unspecified: Secondary | ICD-10-CM

## 2014-01-08 DIAGNOSIS — G44329 Chronic post-traumatic headache, not intractable: Secondary | ICD-10-CM

## 2014-01-08 DIAGNOSIS — F320 Major depressive disorder, single episode, mild: Secondary | ICD-10-CM

## 2014-01-08 DIAGNOSIS — N62 Hypertrophy of breast: Secondary | ICD-10-CM

## 2014-01-08 DIAGNOSIS — F329 Major depressive disorder, single episode, unspecified: Secondary | ICD-10-CM

## 2014-01-08 MED ORDER — TRAMADOL HCL 50 MG PO TABS: 50.0000 mg | ORAL_TABLET | Freq: Three times a day (TID) | ORAL | Status: DC | PRN

## 2014-01-08 NOTE — Patient Instructions (Signed)
It was nice seeing you, sorry you still have the headache which you felt was worse with Effexor. Please stop Effexor for now. Since you are not able to tolerate Amitriptyline I will suggest you hold it for now till you see the neurologist again. Continue use of Tramadol as needed and please get your MRI done. I will see you back in 2 months after you see the neurologist.

## 2014-01-08 NOTE — Assessment & Plan Note (Signed)
Still having headache. Self D/C Amitriptyline due to s/e. I refilled Tramadol prn HA. Scheduled for MRI/MRA in Jan. Our office called radiology department to schedule her MRI today. F/U neurologist as planned.

## 2014-01-08 NOTE — Assessment & Plan Note (Signed)
Causing chronic back and neck pain. I will refer to surgery for breat reduction.

## 2014-01-08 NOTE — Assessment & Plan Note (Signed)
Seem to be doing well. Currently off Effexor. Tired of trying different medication. Will try to go off medication for a while and monitor for improvement. Depression mostly related to her chronic headache. Not currently suicidal.

## 2014-01-08 NOTE — Progress Notes (Signed)
Subjective:     Patient ID: Tracy Rose, female   DOB: Feb 25, 1965, 48 y.o.   MRN: [id number]  HPI Headache: Headache not any better, Effexor made her have headache so she d/c it. Amitriptyline makes her feel groggy and bad hence she stopped it. She had not taken tramadol in a while she will like to get refill for this, denies any new headache symptoms. Depression: Still feeling down especially due to her headache but she feels she is doing better without medication, denies suicidal ideation. Her mood has improved some in the last few weeks, getting ready for the holiday celebration. Breast problem;Still having issues from her large breast, she has to use 3-4 bras to help relieve the weight of her breast on the back but this helps only a little. Her large breast is causing back and shoulder pain, makes her neck pain worse as well. She will like referral for breast reduction.  Current Outpatient Prescriptions on File Prior to Visit  Medication Sig Dispense Refill  . vitamin B-12 (CYANOCOBALAMIN) 100 MCG tablet Take 100 mcg by mouth daily.    Marland Kitchen acetaminophen (TYLENOL) 325 MG tablet Take 650 mg by mouth every 6 (six) hours as needed.    . Diclofenac Potassium 50 MG PACK Take 50 mg by mouth once as needed. Please take with food 30 each 3  . diphenhydrAMINE (BENADRYL) 25 MG tablet Take 1 tablet (25 mg total) by mouth every 6 (six) hours as needed for itching (Rash). 30 tablet 0   No current facility-administered medications on file prior to visit.   Past Medical History  Diagnosis Date  . Tobacco abuse 09/08/2010  . OBESITY, NOS 03/21/2006        . GOITER, MULTINODULAR 09/05/2007    Dr. [facility]  [place] 2011: Stable to slightly smaller bilateral thyroid nodules since 09/04/2007. No new or enlarging thyroid nodules identified. Biopsy 08/2007: non neoplastic goiter   . Genital herpes 12/12/2011  . Mild depression 12/15/2013     Review of Systems  Respiratory: Negative.     Cardiovascular: Negative.   Gastrointestinal: Negative.   Neurological: Positive for headaches. Negative for tremors, seizures, syncope and numbness.  Psychiatric/Behavioral: Negative.        Mood better today.  All other systems reviewed and are negative.  Filed Vitals:   01/08/14 1053  BP: 141/96  Pulse: 79  Temp: 98.1 F (36.7 C)  TempSrc: Oral  Height: 5\' 5"  (1.651 m)  Weight: 243 lb (110.224 kg)       Objective:   Physical Exam  Constitutional: She is oriented to person, place, and time. She appears well-developed. No distress.  Cardiovascular: Normal rate, regular rhythm and normal heart sounds.   No murmur heard. Pulmonary/Chest: Effort normal and breath sounds normal. No respiratory distress. She has no wheezes.    Abdominal: Soft. Bowel sounds are normal. She exhibits no distension. There is no tenderness.  Musculoskeletal: Normal range of motion. She exhibits no edema.  Neurological: She is alert and oriented to person, place, and time. She has normal reflexes. No cranial nerve deficit. Coordination normal.  Psychiatric: She has a normal mood and affect. Her behavior is normal. Judgment and thought content normal.  Nursing note and vitals reviewed.      Assessment:     Headache: Depression: Breast problem;Macromastia.    Plan:     Check problem list

## 2014-01-11 ENCOUNTER — Telehealth: Payer: Self-pay | Admitting: *Deleted

## 2014-01-11 NOTE — Telephone Encounter (Signed)
LM for patient informing her that [facility] doesn't perform breast reductions.  She needs to call her case worker and see what he/she suggests since medicaid doesn't cover plastic surgery.  Also informed her on VM that both her MRA and MRI are scheduled for the same day and she will have them both done with anesthesia. Kordell Jafri,CMA

## 2014-01-19 ENCOUNTER — Encounter (HOSPITAL_COMMUNITY)
Admission: RE | Admit: 2014-01-19 | Discharge: 2014-01-19 | Disposition: A | Discharge status: Home / Self Care | Payer: Medicaid Other | Source: Ambulatory Visit | Attending: Family Medicine | Admitting: Family Medicine

## 2014-01-19 ENCOUNTER — Encounter (HOSPITAL_COMMUNITY): Payer: Self-pay

## 2014-01-19 DIAGNOSIS — M542 Cervicalgia: Secondary | ICD-10-CM | POA: Diagnosis not present

## 2014-01-19 DIAGNOSIS — E010 Iodine-deficiency related diffuse (endemic) goiter: Secondary | ICD-10-CM | POA: Diagnosis not present

## 2014-01-19 DIAGNOSIS — F17200 Nicotine dependence, unspecified, uncomplicated: Secondary | ICD-10-CM | POA: Diagnosis not present

## 2014-01-19 DIAGNOSIS — R51 Headache: Secondary | ICD-10-CM | POA: Diagnosis not present

## 2014-01-19 LAB — CBC
HCT: 38.7 % (ref 36.0–46.0)
HEMOGLOBIN: 12.3 g/dL (ref 12.0–15.0)
MCH: 26.5 pg (ref 26.0–34.0)
MCHC: 31.8 g/dL (ref 30.0–36.0)
MCV: 83.4 fL (ref 78.0–100.0)
Platelets: 308 10*3/uL (ref 150–400)
RBC: 4.64 MIL/uL (ref 3.87–5.11)
RDW: 13.9 % (ref 11.5–15.5)
WBC: 7.5 10*3/uL (ref 4.0–10.5)

## 2014-01-19 LAB — BASIC METABOLIC PANEL
Anion gap: 7 (ref 5–15)
BUN: 12 mg/dL (ref 6–23)
CALCIUM: 8.8 mg/dL (ref 8.4–10.5)
CHLORIDE: 109 meq/L (ref 96–112)
CO2: 22 mmol/L (ref 19–32)
CREATININE: 1.12 mg/dL — AB (ref 0.50–1.10)
GFR calc Af Amer: 67 mL/min — ABNORMAL LOW (ref >90)
GFR, EST NON AFRICAN AMERICAN: 58 mL/min — AB (ref >90)
GLUCOSE: 98 mg/dL (ref 70–99)
Potassium: 4 mmol/L (ref 3.5–5.1)
Sodium: 138 mmol/L (ref 135–145)

## 2014-01-19 LAB — HCG, SERUM, QUALITATIVE: Preg, Serum: NEGATIVE

## 2014-01-19 NOTE — Pre-Procedure Instructions (Signed)
Tracy Rose  01/19/2014   Your procedure is scheduled on:  Tuesday January 5,2016 at 0800 AM  Report to [facility] Admitting at 0600 AM.  Call this number if you have problems the morning of surgery: [phone]   Remember:   Do not eat food or drink liquids after midnight Monday 01/25/14   Take these medicines the morning of surgery with A SIP OF WATER: Tylenol or Tramadol if needed for pain, Elavil,and Effexor -XR   Do not wear jewelry, make-up or nail polish.  Do not wear lotions, powders, or perfumes. You may wear deodorant.  Do not shave 48 hours prior to surgery.   Do not bring valuables to the hospital.  [facility] is not responsible                  for any belongings or valuables.               Contacts, dentures or bridgework may not be worn into surgery.  Leave suitcase in the car. After surgery it may be brought to your room.  For patients admitted to the hospital, discharge time is determined by your                treatment team.               Patients discharged the day of surgery will not be allowed to drive  home.    Special Instructions: [place] - Preparing for Surgery  Before surgery, you can play an important role.  Because skin is not sterile, your skin needs to be as free of germs as possible.  You can reduce the number of germs on you skin by washing with CHG (chlorahexidine gluconate) soap before surgery.  CHG is an antiseptic cleaner which kills germs and bonds with the skin to continue killing germs even after washing.  Please DO NOT use if you have an allergy to CHG or antibacterial soaps.  If your skin becomes reddened/irritated stop using the CHG and inform your nurse when you arrive at Short Stay.  Do not shave (including legs and underarms) for at least 48 hours prior to the first CHG shower.  You may shave your face.  Please follow these instructions carefully:   1.  Shower with CHG Soap the night before surgery and the                                 morning of Surgery.  2.  If you choose to wash your hair, wash your hair first as usual with your       normal shampoo.  3.  After you shampoo, rinse your hair and body thoroughly to remove the                      Shampoo.  4.  Use CHG as you would any other liquid soap.  You can apply chg directly       to the skin and wash gently with scrungie or a clean washcloth.  5.  Apply the CHG Soap to your body ONLY FROM THE NECK DOWN.        Do not use on open wounds or open sores.  Avoid contact with your eyes,       ears, mouth and genitals (private parts).  Wash genitals (private parts)  with your normal soap.  6.  Wash thoroughly, paying special attention to the area where your surgery        will be performed.  7.  Thoroughly rinse your body with warm water from the neck down.  8.  DO NOT shower/wash with your normal soap after using and rinsing off       the CHG Soap.  9.  Pat yourself dry with a clean towel.            10.  Wear clean pajamas.            11.  Place clean sheets on your bed the night of your first shower and do not        sleep with pets.  Day of Surgery  Do not apply any lotions/deoderants the morning of surgery.  Please wear clean clothes to the hospital/surgery center.      Please read over the following fact sheets that you were given: Pain Booklet, Coughing and Deep Breathing and Surgical Site Infection Prevention

## 2014-01-26 ENCOUNTER — Ambulatory Visit (HOSPITAL_COMMUNITY)
Admission: RE | Admit: 2014-01-26 | Discharge: 2014-01-26 | Disposition: A | Discharge status: Home / Self Care | Payer: Medicaid Other | Source: Ambulatory Visit | Attending: Anesthesiology | Admitting: Anesthesiology

## 2014-01-26 ENCOUNTER — Ambulatory Visit (HOSPITAL_COMMUNITY): Payer: Medicaid Other | Admitting: Certified Registered Nurse Anesthetist

## 2014-01-26 ENCOUNTER — Ambulatory Visit (HOSPITAL_COMMUNITY)
Admission: RE | Admit: 2014-01-26 | Payer: Self-pay | Source: Ambulatory Visit | Attending: Family Medicine | Admitting: Family Medicine

## 2014-01-26 ENCOUNTER — Encounter (HOSPITAL_COMMUNITY): Payer: Self-pay | Admitting: Certified Registered Nurse Anesthetist

## 2014-01-26 ENCOUNTER — Encounter (HOSPITAL_COMMUNITY)
Admission: RE | Disposition: A | Discharge status: Home / Self Care | Payer: Self-pay | Source: Ambulatory Visit | Attending: Anesthesiology

## 2014-01-26 ENCOUNTER — Ambulatory Visit (HOSPITAL_COMMUNITY)
Admission: RE | Admit: 2014-01-26 | Discharge: 2014-01-26 | Disposition: A | Discharge status: Home / Self Care | Payer: Medicaid Other | Source: Ambulatory Visit | Attending: Neurology | Admitting: Neurology

## 2014-01-26 DIAGNOSIS — E049 Nontoxic goiter, unspecified: Secondary | ICD-10-CM

## 2014-01-26 DIAGNOSIS — R51 Headache: Secondary | ICD-10-CM | POA: Insufficient documentation

## 2014-01-26 DIAGNOSIS — S0990XA Unspecified injury of head, initial encounter: Secondary | ICD-10-CM

## 2014-01-26 DIAGNOSIS — G44329 Chronic post-traumatic headache, not intractable: Secondary | ICD-10-CM

## 2014-01-26 DIAGNOSIS — E010 Iodine-deficiency related diffuse (endemic) goiter: Secondary | ICD-10-CM | POA: Insufficient documentation

## 2014-01-26 DIAGNOSIS — R9082 White matter disease, unspecified: Secondary | ICD-10-CM | POA: Insufficient documentation

## 2014-01-26 DIAGNOSIS — M542 Cervicalgia: Secondary | ICD-10-CM | POA: Diagnosis not present

## 2014-01-26 DIAGNOSIS — R938 Abnormal findings on diagnostic imaging of other specified body structures: Secondary | ICD-10-CM

## 2014-01-26 DIAGNOSIS — F17210 Nicotine dependence, cigarettes, uncomplicated: Secondary | ICD-10-CM | POA: Insufficient documentation

## 2014-01-26 DIAGNOSIS — F17200 Nicotine dependence, unspecified, uncomplicated: Secondary | ICD-10-CM | POA: Insufficient documentation

## 2014-01-26 DIAGNOSIS — X58XXXA Exposure to other specified factors, initial encounter: Secondary | ICD-10-CM

## 2014-01-26 DIAGNOSIS — I7774 Dissection of vertebral artery: Secondary | ICD-10-CM

## 2014-01-26 HISTORY — PX: RADIOLOGY WITH ANESTHESIA: SHX6223

## 2014-01-26 SURGERY — RADIOLOGY WITH ANESTHESIA
Anesthesia: General

## 2014-01-26 MED ORDER — MIDAZOLAM HCL 2 MG/2ML IJ SOLN
INTRAMUSCULAR | Status: DC | PRN
  Administered 2014-01-26: 2 mg via INTRAVENOUS

## 2014-01-26 MED ORDER — GADOBENATE DIMEGLUMINE 529 MG/ML IV SOLN
20.0000 mL | Freq: Once | INTRAVENOUS | Status: AC | PRN
  Administered 2014-01-26: 20 mL via INTRAVENOUS

## 2014-01-26 NOTE — Anesthesia Postprocedure Evaluation (Signed)
  Anesthesia Post-op Note  Patient: Tracy Rose  Procedure(s) Performed: Procedure(s) (LRB): MRA NECK, MRI BRAIN WITH AND WITHOUT CONTRAST  (N/A)  Patient Location: PACU  Anesthesia Type: General  Level of Consciousness: awake and alert   Airway and Oxygen Therapy: Patient Spontanous Breathing  Post-op Pain: mild  Post-op Assessment: Post-op Vital signs reviewed, Patient's Cardiovascular Status Stable, Respiratory Function Stable, Patent Airway and No signs of Nausea or vomiting  Last Vitals:  Filed Vitals:   01/26/14 1028  BP: 136/94  Pulse: 78  Temp:   Resp: 24    Post-op Vital Signs: stable   Complications: No apparent anesthesia complications

## 2014-01-26 NOTE — Transfer of Care (Signed)
Immediate Anesthesia Transfer of Care Note  Patient: Tracy Rose  Procedure(s) Performed: Procedure(s): MRA NECK, MRI BRAIN WITH AND WITHOUT CONTRAST  (N/A)  Patient Location: PACU  Anesthesia Type:General  Level of Consciousness: awake, alert  and oriented  Airway & Oxygen Therapy: Patient Spontanous Breathing  Post-op Assessment: Report given to PACU RN, Post -op Vital signs reviewed and stable and Patient moving all extremities X 4  Post vital signs: Reviewed and stable  Complications: No apparent anesthesia complications

## 2014-01-26 NOTE — Anesthesia Postprocedure Evaluation (Signed)
  Anesthesia Post-op Note  Patient: Tracy Rose  Procedure(s) Performed: Procedure(s): MRA NECK, MRI BRAIN WITH AND WITHOUT CONTRAST  (N/A)  Patient Location: PACU  Anesthesia Type:General  Level of Consciousness: awake, alert  and oriented  Airway and Oxygen Therapy: Patient Spontanous Breathing  Post-op Pain: none  Post-op Assessment: Post-op Vital signs reviewed, Patient's Cardiovascular Status Stable, Respiratory Function Stable, Patent Airway, No signs of Nausea or vomiting and Adequate PO intake  Post-op Vital Signs: Reviewed and stable  Last Vitals:  Filed Vitals:   01/26/14 1028  BP: 136/94  Pulse: 78  Temp:   Resp: 24    Complications: No apparent anesthesia complications

## 2014-01-26 NOTE — Anesthesia Preprocedure Evaluation (Signed)
Anesthesia Evaluation  Patient identified by MRN, date of birth, ID band Patient awake    Reviewed: Allergy & Precautions, NPO status , Patient's Chart, lab work & pertinent test results  Airway Mallampati: III  TM Distance: <3 FB Neck ROM: Full    Dental no notable dental hx.    Pulmonary Current Smoker,  breath sounds clear to auscultation  Pulmonary exam normal       Cardiovascular negative cardio ROS  Rhythm:Regular Rate:Normal     Neuro/Psych negative neurological ROS  negative psych ROS   GI/Hepatic negative GI ROS, Neg liver ROS,   Endo/Other  Morbid obesity  Renal/GU negative Renal ROS  negative genitourinary   Musculoskeletal negative musculoskeletal ROS (+)   Abdominal   Peds negative pediatric ROS (+)  Hematology negative hematology ROS (+)   Anesthesia Other Findings   Reproductive/Obstetrics negative OB ROS                             Anesthesia Physical Anesthesia Plan  ASA: III  Anesthesia Plan: General   Post-op Pain Management:    Induction: Intravenous  Airway Management Planned: LMA and Oral ETT  Additional Equipment:   Intra-op Plan:   Post-operative Plan: Extubation in OR  Informed Consent: I have reviewed the patients History and Physical, chart, labs and discussed the procedure including the risks, benefits and alternatives for the proposed anesthesia with the patient or authorized representative who has indicated his/her understanding and acceptance.   Dental advisory given  Plan Discussed with: CRNA and Surgeon  Anesthesia Plan Comments:         Anesthesia Quick Evaluation

## 2014-01-26 NOTE — Discharge Instructions (Signed)

## 2014-01-27 ENCOUNTER — Telehealth: Payer: Self-pay | Admitting: *Deleted

## 2014-01-27 NOTE — Telephone Encounter (Signed)
Pt is aware of results.  She has made an appt for next Friday to come in and follow up with her thyroid. Jazmin Hartsell,CMA

## 2014-01-27 NOTE — Telephone Encounter (Signed)
-----   Message from Andrena Mews, MD sent at 01/26/2014  4:44 PM EST ----- Please call to inform patient, no acute findings on MRI result. Encourage her to follow up with neurologist for her headache.  Also MRI showed enlarged thyroid gland. Have her schedule follow up with me for further work up of her thyroid.

## 2014-01-28 ENCOUNTER — Encounter (HOSPITAL_COMMUNITY): Payer: Self-pay | Admitting: Radiology

## 2014-02-02 ENCOUNTER — Ambulatory Visit: Payer: Self-pay | Admitting: Family Medicine

## 2014-02-08 ENCOUNTER — Telehealth: Payer: Self-pay | Admitting: Neurology

## 2014-02-08 NOTE — Telephone Encounter (Signed)
Spoke to patient and relayed results from [facility] of the brain and MRA of the neck. MRI of the brain showed non-specific white matter changes. MRA was unremarkable. She is on a daily aspirin. She is following pcp for management of vascular risk factors. She is aware of her enlarged thyroid and I suggested following up with pcp for discussion of repeat ultrasound of the thyroid as clinically warranted, she is going to tomorrow and will discuss with her pcp. Discussed post-concussive syndrome. She will follow up with me in the office on the 29th.

## 2014-02-09 ENCOUNTER — Telehealth: Payer: Self-pay | Admitting: Family Medicine

## 2014-02-09 ENCOUNTER — Ambulatory Visit (INDEPENDENT_AMBULATORY_CARE_PROVIDER_SITE_OTHER): Payer: Medicaid Other | Admitting: Family Medicine

## 2014-02-09 ENCOUNTER — Encounter: Payer: Self-pay | Admitting: Family Medicine

## 2014-02-09 VITALS — BP 132/88 | HR 74 | Temp 97.8°F | Ht 66.0 in | Wt 242.0 lb

## 2014-02-09 DIAGNOSIS — G44329 Chronic post-traumatic headache, not intractable: Secondary | ICD-10-CM

## 2014-02-09 DIAGNOSIS — N62 Hypertrophy of breast: Secondary | ICD-10-CM

## 2014-02-09 DIAGNOSIS — E042 Nontoxic multinodular goiter: Secondary | ICD-10-CM

## 2014-02-09 DIAGNOSIS — E079 Disorder of thyroid, unspecified: Secondary | ICD-10-CM

## 2014-02-09 LAB — TSH: TSH: 1.836 u[IU]/mL (ref 0.350–4.500)

## 2014-02-09 LAB — T4, FREE: FREE T4: 1.01 ng/dL (ref 0.80–1.80)

## 2014-02-09 LAB — T3, FREE: T3, Free: 2.5 pg/mL (ref 2.3–4.2)

## 2014-02-09 NOTE — Assessment & Plan Note (Signed)
Referral was done to surgery for breast reduction during previous visit however her insurance was not going to cover surgery. I recommended contacting her insurance to discuss this.  I will refer again if insurance approves her surgery.

## 2014-02-09 NOTE — Patient Instructions (Signed)
It was nice seeing you again today. I am glad your MRI report is negative and your headache is improving. Please follow up with neurologist as instructed. 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 I will also get some blood work done. I will call you with test result. Follow up with me as needed.

## 2014-02-09 NOTE — Assessment & Plan Note (Signed)
TFT checked today. Thyroid U/S ordered to assess for progression of her goitre. Plan reassessment by endocrinologist based on test result.

## 2014-02-09 NOTE — Assessment & Plan Note (Signed)
Stable and currently asymptomatic. Patient reassured this is expected to progressively improve. I reviewed and discussed her MRI head report with her. F/U appointment scheduled with neurologist for further management if needed and help with return to work letter.

## 2014-02-09 NOTE — Telephone Encounter (Signed)
Authorization placed in referral. Paxtyn Boyar,CMA

## 2014-02-09 NOTE — Progress Notes (Signed)
Subjective:     Patient ID: Tracy Rose, female   DOB: [birth year], 49 y.o.   MRN: [id number]  HPI  Headache: Patient stated her symptoms has improved some but still have HA with noise and light, denies vision change, no N/V. She has not been able to return to work due to her HA. Thyroid:Here to follow up on her MRI report which showed thyroid enlargement. She has hx of goitre in the past for which she was seen by endocrinologist, she had biopsy done then which was normal. She denies any symptoms of hypo or hyperthyroidism.  Breast:Here to follow up about her breast reduction surgery. Current Outpatient Prescriptions on File Prior to Visit  Medication Sig Dispense Refill  . aspirin EC 325 MG tablet Take 325 mg by mouth daily as needed (muscle numbness).    . B Complex-C (B-COMPLEX WITH VITAMIN C) tablet Take 1 tablet by mouth daily.    . traMADol (ULTRAM) 50 MG tablet Take 1 tablet (50 mg total) by mouth every 8 (eight) hours as needed. (Patient taking differently: Take 50 mg by mouth every 8 (eight) hours as needed (pain). ) 90 tablet 1  . venlafaxine XR (EFFEXOR-XR) 37.5 MG 24 hr capsule Take 37.5 mg by mouth daily.  3  . acetaminophen (TYLENOL) 325 MG tablet Take 650 mg by mouth every 6 (six) hours as needed (pain).     . Diclofenac Potassium 50 MG PACK Take 50 mg by mouth once as needed. Please take with food (Patient not taking: Reported on 01/19/2014) 30 each 3  . OVER THE COUNTER MEDICATION Take 2 tablets by mouth daily. ginko biloba    . vitamin B-12 (CYANOCOBALAMIN) 100 MCG tablet Take 100 mcg by mouth daily.     No current facility-administered medications on file prior to visit.   Past Medical History  Diagnosis Date  . Tobacco abuse 09/08/2010  . OBESITY, NOS 03/21/2006        . GOITER, MULTINODULAR 09/05/2007    Dr. Zada [facility]irt [facility]  [place] 2011: Stable to slightly smaller bilateral thyroid nodules since 09/04/2007. No new or enlarging thyroid nodules identified.  Biopsy 08/2007: non neoplastic goiter   . Genital herpes 12/12/2011  . Mild depression 12/15/2013     Review of Systems  Respiratory: Negative.   Cardiovascular: Negative.   Neurological: Positive for headaches.  All other systems reviewed and are negative.      Filed Vitals:   02/09/14 0939  BP: 132/88  Pulse: 74  Temp: 97.8 F (36.6 C)  TempSrc: Oral  Height: 5\' 6"  (1.676 m)  Weight: 242 lb (109.77 kg)    Objective:   Physical Exam  Constitutional: She is oriented to person, place, and time. She appears well-developed. No distress.  Cardiovascular: Normal rate, regular rhythm and normal heart sounds.   No murmur heard. Pulmonary/Chest: Effort normal and breath sounds normal. No respiratory distress. She has no wheezes.  Abdominal: Soft. Bowel sounds are normal. She exhibits no distension and no mass. There is no tenderness.  Musculoskeletal: Normal range of motion. She exhibits no edema.  Neurological: She is alert and oriented to person, place, and time. No cranial nerve deficit.  Psychiatric: She has a normal mood and affect. Her behavior is normal. Judgment and thought content normal.  Nursing note and vitals reviewed.      Assessment:     Postconcussion HA. Thyroid disorder/Goitre Macromastia      Plan:     Check problem list.

## 2014-02-09 NOTE — Telephone Encounter (Signed)
Has orders in the chart to get an u/s, they need authorization.

## 2014-02-10 ENCOUNTER — Encounter: Payer: Self-pay | Admitting: *Deleted

## 2014-02-10 ENCOUNTER — Telehealth: Payer: Self-pay | Admitting: *Deleted

## 2014-02-10 NOTE — Telephone Encounter (Signed)
-----   Message from Andrena Mews, MD sent at 02/10/2014 10:04 AM EST ----- Please inform patient her thyroid test looks good. I will call with her U/S report when she gets it done.

## 2014-02-10 NOTE — Telephone Encounter (Signed)
Letter with results mailed and sent to patient's mychart. Tracy Rose,CMA

## 2014-02-12 ENCOUNTER — Ambulatory Visit (HOSPITAL_COMMUNITY): Payer: Medicaid Other

## 2014-02-15 ENCOUNTER — Ambulatory Visit (HOSPITAL_COMMUNITY)
Admission: RE | Admit: 2014-02-15 | Discharge: 2014-02-15 | Disposition: A | Discharge status: Home / Self Care | Payer: Medicaid Other | Source: Ambulatory Visit | Attending: Family Medicine | Admitting: Family Medicine

## 2014-02-15 ENCOUNTER — Telehealth: Payer: Self-pay | Admitting: Family Medicine

## 2014-02-15 ENCOUNTER — Encounter: Payer: Self-pay | Admitting: Neurology

## 2014-02-15 ENCOUNTER — Ambulatory Visit (INDEPENDENT_AMBULATORY_CARE_PROVIDER_SITE_OTHER): Payer: Medicaid Other | Admitting: Neurology

## 2014-02-15 VITALS — BP 132/83 | HR 79 | Ht 65.5 in | Wt 244.0 lb

## 2014-02-15 DIAGNOSIS — E042 Nontoxic multinodular goiter: Secondary | ICD-10-CM | POA: Insufficient documentation

## 2014-02-15 DIAGNOSIS — F0781 Postconcussional syndrome: Secondary | ICD-10-CM

## 2014-02-15 DIAGNOSIS — E049 Nontoxic goiter, unspecified: Secondary | ICD-10-CM | POA: Diagnosis present

## 2014-02-15 DIAGNOSIS — E079 Disorder of thyroid, unspecified: Secondary | ICD-10-CM

## 2014-02-15 MED ORDER — VENLAFAXINE HCL ER 75 MG PO CP24: 75.0000 mg | ORAL_CAPSULE | Freq: Every day | ORAL | Status: DC

## 2014-02-15 NOTE — Patient Instructions (Signed)
Overall you are doing fairly well but I do want to suggest a few things today:   Remember to drink plenty of fluid, eat healthy meals and do not skip any meals. Try to eat protein with a every meal and eat a healthy snack such as fruit or nuts in between meals. Try to keep a regular sleep-wake schedule and try to exercise daily, particularly in the form of walking, 20-30 minutes a day, if you can.   As far as your medications are concerned, I would like to suggest; Increase Venlafaxine to 75mg  daily  I would like to see you back in 4 months, sooner if we need to. Please call us with any interim questions, concerns, problems, updates or refill requests.   Please also call us for any test results so we can go over those with you on the phone.  My clinical assistant and will answer any of your questions and relay your messages to me and also relay most of my messages to you.   Our phone number is [phone]. We also have an after hours call service for urgent matters and there is a physician on-call for urgent questions. For any emergencies you know to call 911 or go to the nearest emergency room
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
## 2014-02-15 NOTE — Telephone Encounter (Signed)
I called to discuss test result. Patient did not pick up hence message left for her to call back.  Result: Thyroid function test came back normal.              Patient need to get her thyroid U/S done.

## 2014-02-16 ENCOUNTER — Telehealth: Payer: Self-pay | Admitting: Family Medicine

## 2014-02-16 DIAGNOSIS — E041 Nontoxic single thyroid nodule: Secondary | ICD-10-CM

## 2014-02-16 NOTE — Telephone Encounter (Signed)
I discussed thyroid U/S report with her, there has been some change in size of the nodule. Biopsy recommended. All questions were answered. I will order biopsy.

## 2014-02-24 ENCOUNTER — Inpatient Hospital Stay: Admission: RE | Admit: 2014-02-24 | Payer: Self-pay | Source: Ambulatory Visit

## 2014-02-25 ENCOUNTER — Other Ambulatory Visit (HOSPITAL_COMMUNITY)
Admission: RE | Admit: 2014-02-25 | Discharge: 2014-02-25 | Disposition: A | Discharge status: Home / Self Care | Payer: Medicaid Other | Source: Ambulatory Visit | Attending: Interventional Radiology | Admitting: Interventional Radiology

## 2014-02-25 ENCOUNTER — Ambulatory Visit
Admission: RE | Admit: 2014-02-25 | Discharge: 2014-02-25 | Disposition: A | Discharge status: Home / Self Care | Payer: Medicaid Other | Source: Ambulatory Visit | Attending: Family Medicine | Admitting: Family Medicine

## 2014-02-25 DIAGNOSIS — E041 Nontoxic single thyroid nodule: Secondary | ICD-10-CM | POA: Diagnosis not present

## 2014-03-01 ENCOUNTER — Telehealth: Payer: Self-pay | Admitting: Family Medicine

## 2014-03-01 ENCOUNTER — Encounter: Payer: Self-pay | Admitting: Family Medicine

## 2014-03-01 NOTE — Telephone Encounter (Signed)
Message left to call back.  Thyroid biopsy is benign. Recommending thyroid U/S in 12-24 months.

## 2014-03-19 ENCOUNTER — Ambulatory Visit (INDEPENDENT_AMBULATORY_CARE_PROVIDER_SITE_OTHER): Payer: Medicaid Other | Admitting: Family Medicine

## 2014-03-19 VITALS — BP 141/96 | HR 96 | Temp 97.8°F | Ht 66.0 in | Wt 256.0 lb

## 2014-03-19 DIAGNOSIS — G4489 Other headache syndrome: Secondary | ICD-10-CM

## 2014-03-19 DIAGNOSIS — R519 Headache, unspecified: Secondary | ICD-10-CM | POA: Insufficient documentation

## 2014-03-19 DIAGNOSIS — R51 Headache: Secondary | ICD-10-CM

## 2014-03-19 HISTORY — DX: Headache, unspecified: R51.9

## 2014-03-19 MED ORDER — SUMATRIPTAN SUCCINATE 50 MG PO TABS: ORAL_TABLET | ORAL | Status: DC

## 2014-03-19 NOTE — Assessment & Plan Note (Signed)
Post-concussive versus malingering versus migraine. Patient is been seen by neurology with unremarkable workup. I discussed her case with her primary provider Dr. Gwendlyn Deutscher. Given history reported symptoms, will treat as migraine headache and have patient follow-up closely. I also advised her to taper her Effexor as this may be contributing. She continues to have headache despite these measures I would recommend she go to the headache wellness Center.

## 2014-03-19 NOTE — Patient Instructions (Signed)
It was nice to see you today.  I am treating you with a migraine medicine.  Use as indicated.  Follow up closely with Dr. Gwendlyn Deutscher.
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
## 2014-03-29 ENCOUNTER — Ambulatory Visit (INDEPENDENT_AMBULATORY_CARE_PROVIDER_SITE_OTHER): Payer: Medicaid Other | Admitting: Family Medicine

## 2014-03-29 ENCOUNTER — Encounter: Payer: Self-pay | Admitting: Family Medicine

## 2014-03-29 VITALS — Temp 98.4°F | Wt 248.9 lb

## 2014-03-29 DIAGNOSIS — J020 Streptococcal pharyngitis: Secondary | ICD-10-CM | POA: Insufficient documentation

## 2014-03-29 LAB — POCT RAPID STREP A (OFFICE): RAPID STREP A SCREEN: POSITIVE — AB

## 2014-03-29 MED ORDER — CEFDINIR 300 MG PO CAPS: 300.0000 mg | ORAL_CAPSULE | Freq: Two times a day (BID) | ORAL | Status: DC

## 2014-03-29 NOTE — Progress Notes (Signed)
I was the preceptor on the day of this visit.   Othon Guardia MD  

## 2014-03-29 NOTE — Patient Instructions (Signed)
Please make a follow up appt to see your PCP in 1-2 weeks.   Strep Throat Strep throat is an infection of the throat caused by a bacteria named Streptococcus pyogenes. Your health care provider may call the infection streptococcal "tonsillitis" or "pharyngitis" depending on whether there are signs of inflammation in the tonsils or back of the throat. Strep throat is most common in children aged 47-15 years during the cold months of the year, but it can occur in people of any age during any season. This infection is spread from person to person (contagious) through coughing, sneezing, or other close contact. SIGNS AND SYMPTOMS   Fever or chills.  Painful, swollen, red tonsils or throat.  Pain or difficulty when swallowing.  White or yellow spots on the tonsils or throat.  Swollen, tender lymph nodes or "glands" of the neck or under the jaw.  Red rash all over the body (rare). DIAGNOSIS  Many different infections can cause the same symptoms. A test must be done to confirm the diagnosis so the right treatment can be given. A "rapid strep test" can help your health care provider make the diagnosis in a few minutes. If this test is not available, a light swab of the infected area can be used for a throat culture test. If a throat culture test is done, results are usually available in a day or two. TREATMENT  Strep throat is treated with antibiotic medicine. HOME CARE INSTRUCTIONS   Gargle with 1 tsp of salt in 1 cup of warm water, 3-4 times per day or as needed for comfort.  Family members who also have a sore throat or fever should be tested for strep throat and treated with antibiotics if they have the strep infection.  Make sure everyone in your household washes their hands well.  Do not share food, drinking cups, or personal items that could cause the infection to spread to others.  You may need to eat a soft food diet until your sore throat gets better.  Drink enough water and fluids  to keep your urine clear or pale yellow. This will help prevent dehydration.  Get plenty of rest.  Stay home from school, day care, or work until you have been on antibiotics for 24 hours.  Take medicines only as directed by your health care provider.  Take your antibiotic medicine as directed by your health care provider. Finish it even if you start to feel better. SEEK MEDICAL CARE IF:   The glands in your neck continue to enlarge.  You develop a rash, cough, or earache.  You cough up green, yellow-brown, or bloody sputum.  You have pain or discomfort not controlled by medicines.  Your problems seem to be getting worse rather than better.  You have a fever. SEEK IMMEDIATE MEDICAL CARE IF:   You develop any new symptoms such as vomiting, severe headache, stiff or painful neck, chest pain, shortness of breath, or trouble swallowing.  You develop severe throat pain, drooling, or changes in your voice.  You develop swelling of the neck, or the skin on the neck becomes red and tender.  You develop signs of dehydration, such as fatigue, dry mouth, and decreased urination.  You become increasingly sleepy, or you cannot wake up completely. MAKE SURE YOU:  Understand these instructions.  Will watch your condition.  Will get help right away if you are not doing well or get worse. Document Released: 01/06/2000 Document Revised: 05/25/2013 Document Reviewed: 03/09/2010 ExitCare Patient Information  2015 [employer]. This information is not intended to replace advice given to you by your health care provider. Make sure you discuss any questions you have with your health care provider.

## 2014-03-29 NOTE — Assessment & Plan Note (Addendum)
Clinically convincing of strep pharyngitis, rapid strep positive Treat with Omnicef With report of hemoptysis, which I feel is due to [place] irritation, will ask her to follow-up with PCP in 2-3 weeks to be sure that it has resolved. If it is not resolved considering her smoking history would recommend CT scan to rule out malignancy.

## 2014-03-29 NOTE — Progress Notes (Signed)
Patient ID: Tracy Rose, female   DOB: 1965-12-08, 49 y.o.   MRN: [id number]   HPI  Patient presents today for sore throat  Patient reports a bout 36 hours of severe sore throat, malaise, arthralgias, rhinitis, and tender lymphadenopathy. She states that symptoms came on suddenly and characterize by primarily severe sore throat. She's also reporting productive throat clearing/cough and she cannot differentiate between the 2. She states that she has bloody green mucus in her throat.  She has a 23 year history of one third pack per day smoking, she is not smoking last 3 months. She denies weight loss She's had strep throat before but not in a few years.  Smoking status noted ROS: Per HPI  Objective: Temp(Src) 98.4 F (36.9 C) (Oral)  Wt 248 lb 14.4 oz (112.9 kg) Gen: NAD, alert, cooperative with exam HEENT: NCAT oropharynx with enlarged swollen tonsils, positive tender LAD, TMs WNL BL CV: RRR, good S1/S2, no murmur Resp: CTABL, no wheezes, non-labored Ext: No edema, warm Neuro: Alert and oriented, No gross deficits  Assessment and plan:  Strep pharyngitis Clinically convincing of strep pharyngitis, rapid strep positive Treat with Omnicef With report of hemoptysis, which I feel is due to [place] irritation, will ask her to follow-up with PCP in 2-3 weeks to be sure that it has resolved. If it is not resolved considering her smoking history would recommend CT scan to rule out malignancy.     Orders Placed This Encounter  Procedures  . POCT rapid strep A    Meds ordered this encounter  Medications  . cefdinir (OMNICEF) 300 MG capsule    Sig: Take 1 capsule (300 mg total) by mouth 2 (two) times daily.    Dispense:  20 capsule    Refill:  0

## 2014-04-06 ENCOUNTER — Ambulatory Visit: Payer: Self-pay | Admitting: Family Medicine

## 2014-04-09 ENCOUNTER — Other Ambulatory Visit: Payer: Self-pay | Admitting: Family Medicine

## 2014-04-09 ENCOUNTER — Telehealth: Payer: Self-pay | Admitting: Family Medicine

## 2014-04-09 MED ORDER — ACYCLOVIR 400 MG PO TABS: 400.0000 mg | ORAL_TABLET | Freq: Two times a day (BID) | ORAL | Status: DC

## 2014-04-09 MED ORDER — TRAMADOL HCL 50 MG PO TABS: 50.0000 mg | ORAL_TABLET | Freq: Three times a day (TID) | ORAL | Status: DC | PRN

## 2014-04-09 NOTE — Telephone Encounter (Signed)
Advised pt as directed below and verbalized understanding and on the way to office now to pick up Tramadol script. Joceline Hinchcliff, CMA.

## 2014-04-09 NOTE — Telephone Encounter (Signed)
Tramadol script left up front for pick up. Please inform patient.

## 2014-04-09 NOTE — Telephone Encounter (Signed)
Zovirax has been e-prescribed. Have her come in to pick up Tramadol script.

## 2014-04-09 NOTE — Telephone Encounter (Signed)
Pt informed. Shani Fitch, CMA

## 2014-04-09 NOTE — Telephone Encounter (Signed)
Pt called and needs a refill on her Tramadol and her Zovirax called in. jw

## 2014-04-09 NOTE — Telephone Encounter (Signed)
Refill request. Will forward to PCP for review. Suraya Vidrine, CMA. 

## 2014-06-08 ENCOUNTER — Encounter: Payer: Self-pay | Admitting: *Deleted

## 2014-08-11 ENCOUNTER — Ambulatory Visit (INDEPENDENT_AMBULATORY_CARE_PROVIDER_SITE_OTHER): Payer: Medicaid Other | Admitting: Neurology

## 2014-08-11 ENCOUNTER — Encounter: Payer: Self-pay | Admitting: Neurology

## 2014-08-11 VITALS — BP 152/103 | HR 82 | Ht 66.0 in | Wt 241.0 lb

## 2014-08-11 DIAGNOSIS — F902 Attention-deficit hyperactivity disorder, combined type: Secondary | ICD-10-CM | POA: Diagnosis not present

## 2014-08-11 MED ORDER — ATOMOXETINE HCL 40 MG PO CAPS: 40.0000 mg | ORAL_CAPSULE | Freq: Every day | ORAL | Status: DC

## 2014-08-11 MED ORDER — VENLAFAXINE HCL ER 75 MG PO CP24: 75.0000 mg | ORAL_CAPSULE | Freq: Two times a day (BID) | ORAL | Status: DC

## 2014-08-11 NOTE — Progress Notes (Signed)
[facility]    Provider:  Dr Jaynee Eagles Referring Provider: Kinnie Feil, MD Primary Care Physician:  Andrena Mews, MD  CC:  headaches  HPI:  Tracy Rose is a 49 y.o. female here as a follow up.  Interval Update 08/11/2014: She feels great. She is in school. Headaches are better. She is having decreased focus. Daughter also with decreased concentration. Affecting her more in school. DIfficult with reading. Hard time reading without losing focus. Doing well in coursework. She can't sit in class, has to take school online and take frequent breaks. Can't sit still. Started when she was young and really started affected life as a adult when she had to sit down and focus on one task for an extended period of time. She is worried, she has an 'A' average but Statistics is coming and acocunting and economics and she is terrified that she can't get it done. She has poor organization, putting papers togethr is difficult. . She has difficulty with details. Misses things that are said because she loses focus in conversations. She loses her school materials. She is easily distracted, she loses her keys and phone all the time. She is on the go all the time. Discussed at length she may have unrecognized ADHD. Can try Skipper Cliche, she has a history of substance abuse so will not try aderall or ritalin.  Interval update: 02/15/2014. She continues to have headaches, sensitivity to light, fatigue, decreased concentration. She can't read more than 20 minutes, her eyes get blurry, her head starts to hurt, she gets agitated. She can't be on the computer more than 20-30 minutes. She is feeling much better tho. She is worried that if she goes back to work she will get hurt again. She has not tried going back to work but when she went back to drop off paperwork she started panicking. She was switched from amitriptyline to effexor 37.5 mg by her other provider. Effexor has helped with her anxiety and  with her headache. She is feeling better, headache is a dull 2-3/10 now which is an improvement.   Imaging 01/26/2014: MRI HEAD  No acute infarct.  Nonspecific white matter type changes most notable involving the pons in a distribution suggestive of result of small vessel disease. Other causes of white matter type changes including that secondary to metabolic abnormality, vasculitis, inflammatory process or demyelinating process are felt to be less likely considerations.  No intracranial mass or abnormal enhancement.  MRI NECK  No evidence of vertebral artery dissection.  Mild narrowing proximal vertebral arteries.  No evidence of internal carotid artery dissection or high-grade stenosis.  Mild ectasia distal vertical cervical segment/ pre cavernous segment of the left internal carotid artery without a pleated appearance to suggest fibromuscular dysplasia.  Initial appointment 11/13/2013: Tracy Rose is a 49 y.o. female here as a referral from Dr. Gwendlyn Deutscher for Headache. Patient was hit on the head with a wrench on  August 19th. She was changing a tire and it slipped in her hand and hit her hard. Her neck jerked back and she started bleeding where the tool hit her on the head. No loc, no confusion, had nausea and vomiting and pain. Since then she has had headaches. Never had headaches before. The headaches are either like a dull pressure/vibration or like an elephant hitting her, severe throbbing. Has light sensitivity, headaches are debilitating and she can't even listen to people breath 10/10 pain - sometimes a "20/10". Daily headaches. Never goes away. Today  is 4/10. Radiates down the back of the left ear and neck. Neck is hurting left side (points to area of the left vertebral artery) and muscle spasms. Jerked her head severely wih instant left sided posterior neck pain. No focal weakness. Difficulty concentrating, takes longer to complete tasks. Endorses mood changes,  decreased engery, dizziness. Taking tylenol daily "15-20 a day". In HS played football, basketball, soccer, she ran track and had previous head injuries then possibly.     Review of Systems: Patient complains of symptoms per HPI as well as the following symptoms: Ringing in ears, shortness of breath, rectal bleeding, rectal pain, palpitations, snoring, incontinence of bladder, dizziness, headache, decreased concentration, hyperactivity . Pertinent negatives per HPI. All others negative.   History   Social History  . Marital Status: Divorced    Spouse Name: N/A  . Number of Children: 1  . Years of Education: Assoc.   Occupational History  . [employer]    Social History Main Topics  . Smoking status: Current Every Day Smoker -- 0.15 packs/day for 25 years    Types: Cigarettes    Last Attempt to Quit: 03/31/2013  . Smokeless tobacco: Former Systems developer    Quit date: 10/28/2010  . Alcohol Use: No  . Drug Use: No  . Sexual Activity: Not Currently   Other Topics Concern  . Not on file   Social History Narrative   Unemployed.  Associates Degree.  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   Caffeine Use: none    Family History  Problem Relation Age of Onset  . Arthritis Mother   . Diabetes Father   . Arthritis Father   . Hypertension Father   . Hyperlipidemia Father   . Heart disease Father   . Stroke Father   . Cancer Maternal Grandfather   . Migraines Neg Hx     Past Medical History  Diagnosis Date  . Tobacco abuse 09/08/2010  . OBESITY, NOS 03/21/2006        . GOITER, MULTINODULAR 09/05/2007    Dr. [facility]  [place] 2011: Stable to slightly smaller bilateral thyroid nodules since 09/04/2007. No new or enlarging thyroid nodules identified. Biopsy 08/2007: non neoplastic goiter   . Genital herpes 12/12/2011  . Mild depression 12/15/2013    Past Surgical History  Procedure Laterality Date  . Wisdom tooth extraction       @ age 36  . Radiology with anesthesia N/A 01/26/2014    Procedure: MRA NECK, MRI BRAIN WITH AND WITHOUT CONTRAST ;  Surgeon: Medication Radiologist, MD;  Location: [place];  Service: Radiology;  Laterality: N/A;    Current Outpatient Prescriptions  Medication Sig Dispense Refill  . acetaminophen (TYLENOL) 325 MG tablet Take 650 mg by mouth every 6 (six) hours as needed (pain).     . B Complex-C (B-COMPLEX WITH VITAMIN C) tablet Take 1 tablet by mouth daily.    . Diclofenac Potassium 50 MG PACK Take 50 mg by mouth once as needed. Please take with food 30 each 3  . OVER THE COUNTER MEDICATION Take 2 tablets by mouth daily. ginko biloba    . venlafaxine XR (EFFEXOR XR) 75 MG 24 hr capsule Take 1 capsule (75 mg total) by mouth daily with breakfast. 30 capsule 11  . venlafaxine XR (EFFEXOR-XR) 37.5 MG 24 hr capsule Take 37.5 mg by mouth 2 (two) times daily.  3  . vitamin B-12 (CYANOCOBALAMIN) 100 MCG tablet Take 100 mcg  by mouth daily.    Marland Kitchen acyclovir (ZOVIRAX) 400 MG tablet Take 1 tablet (400 mg total) by mouth 2 (two) times daily. (Patient not taking: Reported on 08/11/2014) 60 tablet 2  . aspirin EC 325 MG tablet Take 325 mg by mouth daily as needed (muscle numbness).    . SUMAtriptan (IMITREX) 50 MG tablet 1 tablet as needed for headache. May repeat in 2 hours x 1 dose if headache persists or recurs. (Patient not taking: Reported on 08/11/2014) 10 tablet 0   No current facility-administered medications for this visit.    Allergies as of 08/11/2014  . (No Known Allergies)    Vitals: BP 152/103 mmHg  Pulse 82  Ht 5\' 6"  (1.676 m)  Wt 241 lb (109.317 kg)  BMI 38.92 kg/m2 Last Weight:  Wt Readings from Last 1 Encounters:  08/11/14 241 lb (109.317 kg)   Last Height:   Ht Readings from Last 1 Encounters:  08/11/14 5\' 6"  (1.676 m)   Physical exam: Exam: Gen: NAD, conversant, well nourised, obese, well groomed                     CV: RRR Eyes: Conjunctivae clear without exudates or  hemorrhage  Neuro: Detailed Neurologic Exam  Speech:    Speech is normal; fluent and spontaneous with normal comprehension.  Cognition:    The patient is oriented to person, place, and time;     recent and remote memory intact;     language fluent;     normal attention, concentration,     fund of knowledge Cranial Nerves:    The pupils are equal, round, and reactive to light. The fundi are normal and spontaneous venous pulsations are present. Visual fields are full to finger confrontation. Extraocular movements are intact. Trigeminal sensation is intact and the muscles of mastication are normal. The face is symmetric. The palate elevates in the midline. Hearing intact. Voice is normal. Shoulder shrug is normal. The tongue has normal motion without fasciculations.        Assessment/Plan:  49 year old with headache after concussion which is improving. She feels great, is going to school. Appears to have undiagnosed ADHD. Will start Stratterra at a low dose.   Take blood pressure at home daily once in the morning Continue Venlafaxine  Sarina Ill, MD  [facility] [address] [place], [place] [address]  Phone [phone] Fax [phone]  A total of 30 minutes was spent face-to-face with this patient. Over half this time was spent on counseling patient on the ADHD diagnosis and different diagnostic and therapeutic options available.

## 2014-08-11 NOTE — Patient Instructions (Signed)
Overall you are doing fairly well but I do want to suggest a few things today:   Remember to drink plenty of fluid, eat healthy meals and do not skip any meals. Try to eat protein with a every meal and eat a healthy snack such as fruit or nuts in between meals. Try to keep a regular sleep-wake schedule and try to exercise daily, particularly in the form of walking, 20-30 minutes a day, if you can.   As far as your medications are concerned, I would like to suggest: Strattera 40mg  in the morning. Call me if you need an increase  I would like to see you back in 6 months, sooner if we need to. Please call us with any interim questions, concerns, problems, updates or refill requests.   Please also call us for any test results so we can go over those with you on the phone.  My clinical assistant and will answer any of your questions and relay your messages to me and also relay most of my messages to you.   Our phone number is [phone]. We also have an after hours call service for urgent matters and there is a physician on-call for urgent questions. For any emergencies you know to call 911 or go to the nearest emergency room

## 2014-08-15 DIAGNOSIS — F909 Attention-deficit hyperactivity disorder, unspecified type: Secondary | ICD-10-CM | POA: Insufficient documentation

## 2014-09-09 ENCOUNTER — Telehealth: Payer: Self-pay | Admitting: Neurology

## 2014-09-09 NOTE — Telephone Encounter (Signed)
Patient called inquiring if forms were ready. She also states theatomoxetine (STRATTERA) 40 MG capsule is not working. She gets very hyper, and can't focus and is sleeping more. She is wanting to stop taking it.  Please call and advise. She can be reached at [phone].

## 2014-09-09 NOTE — Telephone Encounter (Signed)
I spoke with patient. Told her to stop the Strattera. I will mail the letter to her house. Will fax to [place] as well.

## 2014-09-12 ENCOUNTER — Encounter: Payer: Self-pay | Admitting: Neurology

## 2014-09-12 NOTE — Telephone Encounter (Signed)
Tracy Rose - will you please print the letter and have me sign it and then mail to patient please? Thank you

## 2014-09-13 NOTE — Telephone Encounter (Signed)
Signed letter mailed 09/13/14.

## 2014-09-20 ENCOUNTER — Emergency Department (HOSPITAL_COMMUNITY)
Admission: EM | Admit: 2014-09-20 | Discharge: 2014-09-20 | Disposition: A | Discharge status: Home / Self Care | Payer: Medicaid Other | Attending: Emergency Medicine | Admitting: Emergency Medicine

## 2014-09-20 DIAGNOSIS — Z3202 Encounter for pregnancy test, result negative: Secondary | ICD-10-CM | POA: Insufficient documentation

## 2014-09-20 DIAGNOSIS — F419 Anxiety disorder, unspecified: Secondary | ICD-10-CM | POA: Insufficient documentation

## 2014-09-20 DIAGNOSIS — R300 Dysuria: Secondary | ICD-10-CM

## 2014-09-20 DIAGNOSIS — Z720 Tobacco use: Secondary | ICD-10-CM | POA: Diagnosis not present

## 2014-09-20 DIAGNOSIS — E860 Dehydration: Secondary | ICD-10-CM | POA: Diagnosis not present

## 2014-09-20 DIAGNOSIS — Z79899 Other long term (current) drug therapy: Secondary | ICD-10-CM | POA: Insufficient documentation

## 2014-09-20 DIAGNOSIS — M545 Low back pain, unspecified: Secondary | ICD-10-CM

## 2014-09-20 DIAGNOSIS — E669 Obesity, unspecified: Secondary | ICD-10-CM | POA: Insufficient documentation

## 2014-09-20 DIAGNOSIS — Z8619 Personal history of other infectious and parasitic diseases: Secondary | ICD-10-CM | POA: Insufficient documentation

## 2014-09-20 DIAGNOSIS — R195 Other fecal abnormalities: Secondary | ICD-10-CM | POA: Diagnosis present

## 2014-09-20 DIAGNOSIS — F329 Major depressive disorder, single episode, unspecified: Secondary | ICD-10-CM | POA: Diagnosis not present

## 2014-09-20 DIAGNOSIS — R319 Hematuria, unspecified: Secondary | ICD-10-CM | POA: Diagnosis not present

## 2014-09-20 DIAGNOSIS — R1084 Generalized abdominal pain: Secondary | ICD-10-CM

## 2014-09-20 LAB — CBC
HCT: 41.1 % (ref 36.0–46.0)
HEMOGLOBIN: 13 g/dL (ref 12.0–15.0)
MCH: 26.9 pg (ref 26.0–34.0)
MCHC: 31.6 g/dL (ref 30.0–36.0)
MCV: 84.9 fL (ref 78.0–100.0)
PLATELETS: 319 10*3/uL (ref 150–400)
RBC: 4.84 MIL/uL (ref 3.87–5.11)
RDW: 13.7 % (ref 11.5–15.5)
WBC: 7.9 10*3/uL (ref 4.0–10.5)

## 2014-09-20 LAB — URINALYSIS, ROUTINE W REFLEX MICROSCOPIC
Bilirubin Urine: NEGATIVE
Glucose, UA: NEGATIVE mg/dL
Hgb urine dipstick: NEGATIVE
Ketones, ur: NEGATIVE mg/dL
LEUKOCYTES UA: NEGATIVE
NITRITE: NEGATIVE
PROTEIN: NEGATIVE mg/dL
Specific Gravity, Urine: 1.041 — ABNORMAL HIGH (ref 1.005–1.030)
Urobilinogen, UA: 0.2 mg/dL (ref 0.0–1.0)
pH: 5.5 (ref 5.0–8.0)

## 2014-09-20 LAB — TYPE AND SCREEN
ABO/RH(D): O POS
Antibody Screen: NEGATIVE

## 2014-09-20 LAB — COMPREHENSIVE METABOLIC PANEL
ALT: 12 U/L — ABNORMAL LOW (ref 14–54)
AST: 16 U/L (ref 15–41)
Albumin: 3.6 g/dL (ref 3.5–5.0)
Alkaline Phosphatase: 59 U/L (ref 38–126)
Anion gap: 7 (ref 5–15)
BUN: 10 mg/dL (ref 6–20)
CHLORIDE: 109 mmol/L (ref 101–111)
CO2: 24 mmol/L (ref 22–32)
CREATININE: 1.48 mg/dL — AB (ref 0.44–1.00)
Calcium: 9 mg/dL (ref 8.9–10.3)
GFR calc non Af Amer: 41 mL/min — ABNORMAL LOW (ref >60)
GFR, EST AFRICAN AMERICAN: 48 mL/min — AB (ref >60)
GLUCOSE: 96 mg/dL (ref 65–99)
Potassium: 4 mmol/L (ref 3.5–5.1)
SODIUM: 140 mmol/L (ref 135–145)
Total Bilirubin: 0.6 mg/dL (ref 0.3–1.2)
Total Protein: 7 g/dL (ref 6.5–8.1)

## 2014-09-20 LAB — POC OCCULT BLOOD, ED: Fecal Occult Bld: NEGATIVE

## 2014-09-20 LAB — POC URINE PREG, ED: PREG TEST UR: NEGATIVE

## 2014-09-20 LAB — ABO/RH: ABO/RH(D): O POS

## 2014-09-20 MED ORDER — NAPROXEN 500 MG PO TABS: 500.0000 mg | ORAL_TABLET | Freq: Two times a day (BID) | ORAL | Status: DC

## 2014-09-20 MED ORDER — SODIUM CHLORIDE 0.9 % IV BOLUS (SEPSIS)
1000.0000 mL | Freq: Once | INTRAVENOUS | Status: AC
  Administered 2014-09-20: 1000 mL via INTRAVENOUS

## 2014-09-20 MED ORDER — KETOROLAC TROMETHAMINE 30 MG/ML IJ SOLN
30.0000 mg | Freq: Once | INTRAMUSCULAR | Status: AC
  Administered 2014-09-20: 30 mg via INTRAVENOUS
  Filled 2014-09-20: qty 1

## 2014-09-20 NOTE — ED Provider Notes (Signed)
CSN: [id number]     Arrival date & time 09/20/14  1500 History   First MD Initiated Contact with Patient 09/20/14 1957     Chief Complaint  Patient presents with  . Hematuria  . Blood In Stools     (Consider location/radiation/quality/duration/timing/severity/associated sxs/prior Treatment) HPI Comments: 49 year old female with a history of obesity and mild depression presents to the emergency department for multiple complaints. Patient states that she needs to be evaluated for bleeding and back pain. She is a poor historian, but states that she believes her symptoms began 2 weeks ago. She reports some lower abdominal cramping which felt as though her menstrual cycle was coming on. She states that she began experiencing vaginal bleeding consistent with menses approximately 1-2 days later. She states that her vaginal bleeding during her menstrual cycle was heavier than normal and she went through a box of 34 pads. Her vaginal bleeding is not present now. She states that she has persistently noted some pain in her left low back which she describes as burning. This pain is constant and aggravated prior to urinating. The patient has been experiencing some dysuria and believes that she has been experiencing some blood in her urine. She characterizes her urine as dark in color, sometimes orange. She denies ever urinating any clots. She took tramadol for her back pain today without relief, but has not taken any other medications for pain. Patient also reports that she has been noting some blood in her stools. She states this has mostly been brown stool mixed with "some red stuff" which seem sporadic in presentation given patient's story. She also reports coughing up some greenish/yellow phlegm which she believes has been mixed with some BRB. Hemoptysis has been intermittent and without modifying factors. Patient denies any bowel/bladder incontinence, fever, SOB, melena, nausea, or vomiting. She denies a history  of similar symptoms.  Patient is a 49 y.o. female presenting with hematuria. The history is provided by the patient. No language interpreter was used.  Hematuria Associated symptoms include abdominal pain and coughing. Pertinent negatives include no fever, nausea, numbness, vomiting or weakness.    Past Medical History  Diagnosis Date  . Tobacco abuse 09/08/2010  . OBESITY, NOS 03/21/2006        . GOITER, MULTINODULAR 09/05/2007    Dr. Zada [facility]  [place] 2011: Stable to slightly smaller bilateral thyroid nodules since 09/04/2007. No new or enlarging thyroid nodules identified. Biopsy 08/2007: non neoplastic goiter   . Genital herpes 12/12/2011  . Mild depression 12/15/2013   Past Surgical History  Procedure Laterality Date  . Wisdom tooth extraction      @ age 89  . Radiology with anesthesia N/A 01/26/2014    Procedure: MRA NECK, MRI BRAIN WITH AND WITHOUT CONTRAST ;  Surgeon: Medication Radiologist, MD;  Location: [facility];  Service: Radiology;  Laterality: N/A;   Family History  Problem Relation Age of Onset  . Arthritis Mother   . Diabetes Father   . Arthritis Father   . Hypertension Father   . Hyperlipidemia Father   . Heart disease Father   . Stroke Father   . Cancer Maternal Grandfather   . Migraines Neg Hx    Social History  Substance Use Topics  . Smoking status: Current Every Day Smoker -- 0.15 packs/day for 25 years    Types: Cigarettes    Last Attempt to Quit: 03/31/2013  . Smokeless tobacco: Former Systems developer    Quit date: 10/28/2010  . Alcohol Use: No  OB History    Gravida Para Term Preterm AB TAB SAB Ectopic Multiple Living   4 2 2  0 2 2 0 0 0 1      Review of Systems  Constitutional: Negative for fever.  Respiratory: Positive for cough.   Gastrointestinal: Positive for abdominal pain and blood in stool. Negative for nausea and vomiting.  Genitourinary: Positive for dysuria and hematuria.  Musculoskeletal: Positive for back pain.   Neurological: Negative for weakness and numbness.  All other systems reviewed and are negative.   Allergies  Review of patient's allergies indicates no known allergies.  Home Medications   Prior to Admission medications   Medication Sig Start Date End Date Taking? Authorizing Provider  acetaminophen (TYLENOL) 325 MG tablet Take 650 mg by mouth every 6 (six) hours as needed (pain).    Yes Historical Provider, MD  B Complex-C (B-COMPLEX WITH VITAMIN C) tablet Take 1 tablet by mouth daily.   Yes Historical Provider, MD  OVER THE COUNTER MEDICATION Take 2 tablets by mouth daily. ginko biloba   Yes Historical Provider, MD  traMADol (ULTRAM) 50 MG tablet Take 100 mg by mouth 3 (three) times daily as needed for moderate pain.   Yes Historical Provider, MD  venlafaxine XR (EFFEXOR XR) 75 MG 24 hr capsule Take 1 capsule (75 mg total) by mouth 2 (two) times daily. 08/11/14  Yes Melvenia Beam, MD  vitamin B-12 (CYANOCOBALAMIN) 100 MCG tablet Take 100 mcg by mouth daily.   Yes Historical Provider, MD  acyclovir (ZOVIRAX) 400 MG tablet Take 1 tablet (400 mg total) by mouth 2 (two) times daily. Patient not taking: Reported on 08/11/2014 04/09/14   Kinnie Feil, MD  atomoxetine (STRATTERA) 40 MG capsule Take 1 capsule (40 mg total) by mouth daily. Patient not taking: Reported on 09/20/2014 08/11/14   Melvenia Beam, MD  Diclofenac Potassium 50 MG PACK Take 50 mg by mouth once as needed. Please take with food Patient not taking: Reported on 09/20/2014 11/13/13   Melvenia Beam, MD  naproxen (NAPROSYN) 500 MG tablet Take 1 tablet (500 mg total) by mouth 2 (two) times daily. 09/20/14   Antonietta Breach, PA-C  SUMAtriptan (IMITREX) 50 MG tablet 1 tablet as needed for headache. May repeat in 2 hours x 1 dose if headache persists or recurs. Patient not taking: Reported on 08/11/2014 03/19/14   Barnie Del Cook, DO   BP 111/70 mmHg  Pulse 85  Temp(Src) 97.3 F (36.3 C) (Oral)  Resp 19  SpO2 100%  LMP 09/06/2014  (Approximate)   Physical Exam  Constitutional: She is oriented to person, place, and time. She appears well-developed and well-nourished. No distress.  Patient dramatic during exam. She does not appearing toxic/septic.  HENT:  Head: Normocephalic and atraumatic.  Eyes: Conjunctivae and EOM are normal. No scleral icterus.  Neck: Normal range of motion.  Cardiovascular: Normal rate, regular rhythm and intact distal pulses.   Pulmonary/Chest: Effort normal and breath sounds normal. No respiratory distress. She has no wheezes. She has no rales.  Respirations even and unlabored. Lungs clear to auscultation laterally.  Abdominal: Soft. She exhibits no distension. There is tenderness. There is no rebound and no guarding.  Diffusely tender abdomen. No focal tenderness identified. No masses. Exam limited secondary to body habitus.  Genitourinary:  Chaperoned rectal exam with normal rectal tone. Stool in rectal vault; brown in color. No gross blood. No hemorrhoids or anal fissure.  Musculoskeletal: Normal range of motion.  Patient exquisitely tender  diffusely to low back when touched; unable to palpate back secondary to patient condition. No bony deformities, step offs, or crepitus noted to back.  Neurological: She is alert and oriented to person, place, and time. She exhibits normal muscle tone. Coordination normal.  GCS 15. Patient moving all extremities.  Skin: Skin is warm and dry. No rash noted. She is not diaphoretic. No erythema. No pallor.  Psychiatric: Her behavior is normal. Her mood appears anxious.  Nursing note and vitals reviewed.   ED Course  Procedures (including critical care time) Labs Review Labs Reviewed  COMPREHENSIVE METABOLIC PANEL - Abnormal; Notable for the following:    Creatinine, Ser 1.48 (*)    ALT 12 (*)    GFR calc non Af Amer 41 (*)    GFR calc Af Amer 48 (*)    All other components within normal limits  URINALYSIS, ROUTINE W REFLEX MICROSCOPIC (NOT AT [facility]) -  Abnormal; Notable for the following:    Specific Gravity, Urine 1.041 (*)    All other components within normal limits  CBC  POC OCCULT BLOOD, ED  POC URINE PREG, ED  TYPE AND SCREEN  ABO/RH    Imaging Review No results found.   I have personally reviewed and evaluated these images and lab results as part of my medical decision-making.   EKG Interpretation None       Medications  sodium chloride 0.9 % bolus 1,000 mL (0 mLs Intravenous Stopped 09/20/14 2159)  ketorolac (TORADOL) 30 MG/ML injection 30 mg (30 mg Intravenous Given 09/20/14 2125)    MDM   Final diagnoses:  Left-sided low back pain without sciatica  Generalized abdominal pain  Dysuria  Dehydration    49 year old female presents to the emergency department for multiple complaints. She is complaining of hematuria, hematochezia, and hemoptysis as well as heavy vaginal bleeding. Symptoms started 1.5 weeks ago, per patient, and have been associated with lower abdominal and back pain. Patient is visibly anxious. She is a poor historian. Patient noted to be afebrile as well as hemodynamically stable over ED course.  Patient with no focal abdominal tenderness on exam. She is diffusely tender to her low back. Patient is very difficult to examine secondary to her anxiety. Any degree of touch causes her to move in the bed, as though she is uncomfortable. Patient's laboratory workup reviewed which is noncontributory. Patient is Hemoccult-negative. She has no hematuria or signs of urinary tract infection. H/H stable. No elevated BUN to suggest GI bleed. Liver function preserved. Kidney function is mildly elevated which is likely due to dehydration given her high SG on her urinalysis. IVF given. Doubt infectious or emergent abdominal etiology such as pyelonephritis, appendicitis, pSBO/SBO, cholecystitis, or intussusception especially in light of chronicity of symptoms, absence of focal pain, and reassuring work up. No red flags or  signs concerning for cauda equina.  Patient treated in ED with Toradol for pain. Will d/c with instruction for PCP follow up for recheck of symptoms. Return precautions given at discharge. Patient requesting [place] sandwich which she was given. Patient discharged in good condition.   Filed Vitals:   09/20/14 2115 09/20/14 2159 09/20/14 2200 09/20/14 2215  BP: 140/99 114/74 114/74 111/70  Pulse: 96 82 83 85  Temp:      TempSrc:      Resp: 26 18 14 19   SpO2: 97% 100% 100% 100%     Antonietta Breach, PA-C 09/20/14 [place] Yao, MD 09/21/14 [phone]

## 2014-09-20 NOTE — Discharge Instructions (Signed)
It is possible that your symptoms may be due to muscle pain in your back. Take Naproxen as prescribed for symptoms. You did not have any blood in your urine or stool today. Your blood counts were normal. Your urine was not infected to suggest a urinary tract infection. Follow up with your primary care doctor for further evaluation of your symptoms. Be sure to drink plenty of water (8-10 glasses per day) to prevent dehydration. Dehydration can cause your urine to be dark in color. Return to the ED as needed if symptoms worsen.  Back Pain, Adult Low back pain is very common. About 1 in 5 people have back pain.The cause of low back pain is rarely dangerous. The pain often gets better over time.About half of people with a sudden onset of back pain feel better in just 2 weeks. About 8 in 10 people feel better by 6 weeks.  CAUSES Some common causes of back pain include:  Strain of the muscles or ligaments supporting the spine.  Wear and tear (degeneration) of the spinal discs.  Arthritis.  Direct injury to the back. DIAGNOSIS Most of the time, the direct cause of low back pain is not known.However, back pain can be treated effectively even when the exact cause of the pain is unknown.Answering your caregiver's questions about your overall health and symptoms is one of the most accurate ways to make sure the cause of your pain is not dangerous. If your caregiver needs more information, he or she may order lab work or imaging tests (X-rays or MRIs).However, even if imaging tests show changes in your back, this usually does not require surgery. HOME CARE INSTRUCTIONS For many people, back pain returns.Since low back pain is rarely dangerous, it is often a condition that people can learn to [facility] their own.   Remain active. It is stressful on the back to sit or stand in one place. Do not sit, drive, or stand in one place for more than 30 minutes at a time. Take short walks on level surfaces as soon  as pain allows.Try to increase the length of time you walk each day.  Do not stay in bed.Resting more than 1 or 2 days can delay your recovery.  Do not avoid exercise or work.Your body is made to move.It is not dangerous to be active, even though your back may hurt.Your back will likely heal faster if you return to being active before your pain is gone.  Pay attention to your body when you bend and lift. Many people have less discomfortwhen lifting if they bend their knees, keep the load close to their bodies,and avoid twisting. Often, the most comfortable positions are those that put less stress on your recovering back.  Find a comfortable position to sleep. Use a firm mattress and lie on your side with your knees slightly bent. If you lie on your back, put a pillow under your knees.  Only take over-the-counter or prescription medicines as directed by your caregiver. Over-the-counter medicines to reduce pain and inflammation are often the most helpful.Your caregiver may prescribe muscle relaxant drugs.These medicines help dull your pain so you can more quickly return to your normal activities and healthy exercise.  Put ice on the injured area.  Put ice in a plastic bag.  Place a towel between your skin and the bag.  Leave the ice on for 15-20 minutes, 03-04 times a day for the first 2 to 3 days. After that, ice and heat may be alternated  to reduce pain and spasms.  Ask your caregiver about trying back exercises and gentle massage. This may be of some benefit.  Avoid feeling anxious or stressed.Stress increases muscle tension and can worsen back pain.It is important to recognize when you are anxious or stressed and learn ways to manage it.Exercise is a great option. SEEK MEDICAL CARE IF:  You have pain that is not relieved with rest or medicine.  You have pain that does not improve in 1 week.  You have new symptoms.  You are generally not feeling well. SEEK IMMEDIATE  MEDICAL CARE IF:   You have pain that radiates from your back into your legs.  You develop new bowel or bladder control problems.  You have unusual weakness or numbness in your arms or legs.  You develop nausea or vomiting.  You develop abdominal pain.  You feel faint. Document Released: 01/08/2005 Document Revised: 07/10/2011 Document Reviewed: 05/12/2013 [facility] Patient Information 2015 [place], [place]. This information is not intended to replace advice given to you by your health care provider. Make sure you discuss any questions you have with your health care provider.

## 2014-09-20 NOTE — ED Notes (Signed)
Pt eating turkey sandwich

## 2014-09-20 NOTE — ED Notes (Addendum)
Pt reports to the ED for eval of bleeding. She reports she has been having blood in her stools, urine, and hemoptysis. She also reports she had some very heavy vaginal bleeding. She also reports dysuria and left sided low back pain. She is not on any blood thinners. She denies any hx of nephrolithiasis. She took some tramadol without relief. Pt A&Ox4, resp e/u, and skin warm and dry.

## 2014-10-01 ENCOUNTER — Ambulatory Visit: Payer: Self-pay | Admitting: Family Medicine

## 2014-11-11 ENCOUNTER — Telehealth: Payer: Self-pay | Admitting: *Deleted

## 2014-11-11 ENCOUNTER — Ambulatory Visit: Payer: Medicaid Other | Admitting: Neurology

## 2014-11-11 NOTE — Telephone Encounter (Signed)
no showed follow up appointment  

## 2014-11-15 ENCOUNTER — Encounter: Payer: Self-pay | Admitting: Neurology

## 2014-11-26 ENCOUNTER — Ambulatory Visit: Payer: Self-pay | Admitting: Family Medicine

## 2015-02-09 ENCOUNTER — Other Ambulatory Visit (HOSPITAL_COMMUNITY)
Admission: RE | Admit: 2015-02-09 | Discharge: 2015-02-09 | Disposition: A | Discharge status: Home / Self Care | Payer: Medicaid Other | Source: Ambulatory Visit | Attending: Family Medicine | Admitting: Family Medicine

## 2015-02-09 ENCOUNTER — Ambulatory Visit (INDEPENDENT_AMBULATORY_CARE_PROVIDER_SITE_OTHER): Payer: Medicaid Other | Admitting: Family Medicine

## 2015-02-09 ENCOUNTER — Encounter: Payer: Self-pay | Admitting: Family Medicine

## 2015-02-09 VITALS — BP 126/91 | HR 89 | Temp 98.5°F | Wt 246.6 lb

## 2015-02-09 DIAGNOSIS — Z202 Contact with and (suspected) exposure to infections with a predominantly sexual mode of transmission: Secondary | ICD-10-CM | POA: Insufficient documentation

## 2015-02-09 DIAGNOSIS — R590 Localized enlarged lymph nodes: Secondary | ICD-10-CM | POA: Insufficient documentation

## 2015-02-09 DIAGNOSIS — R599 Enlarged lymph nodes, unspecified: Secondary | ICD-10-CM

## 2015-02-09 DIAGNOSIS — Z113 Encounter for screening for infections with a predominantly sexual mode of transmission: Secondary | ICD-10-CM | POA: Diagnosis not present

## 2015-02-09 LAB — CBC WITH DIFFERENTIAL/PLATELET
BASOS ABS: 0 10*3/uL (ref 0.0–0.1)
Basophils Relative: 0 % (ref 0–1)
EOS ABS: 0.1 10*3/uL (ref 0.0–0.7)
Eosinophils Relative: 1 % (ref 0–5)
HEMATOCRIT: 39.4 % (ref 36.0–46.0)
HEMOGLOBIN: 12.8 g/dL (ref 12.0–15.0)
LYMPHS ABS: 2.9 10*3/uL (ref 0.7–4.0)
LYMPHS PCT: 38 % (ref 12–46)
MCH: 26.7 pg (ref 26.0–34.0)
MCHC: 32.5 g/dL (ref 30.0–36.0)
MCV: 82.1 fL (ref 78.0–100.0)
MONOS PCT: 6 % (ref 3–12)
MPV: 9.6 fL (ref 8.6–12.4)
Monocytes Absolute: 0.5 10*3/uL (ref 0.1–1.0)
NEUTROS PCT: 55 % (ref 43–77)
Neutro Abs: 4.2 10*3/uL (ref 1.7–7.7)
PLATELETS: 364 10*3/uL (ref 150–400)
RBC: 4.8 MIL/uL (ref 3.87–5.11)
RDW: 14.5 % (ref 11.5–15.5)
WBC: 7.7 10*3/uL (ref 4.0–10.5)

## 2015-02-09 LAB — COMPLETE METABOLIC PANEL WITH GFR
ALBUMIN: 4 g/dL (ref 3.6–5.1)
ALK PHOS: 68 U/L (ref 33–115)
ALT: 10 U/L (ref 6–29)
AST: 11 U/L (ref 10–35)
BILIRUBIN TOTAL: 0.3 mg/dL (ref 0.2–1.2)
BUN: 12 mg/dL (ref 7–25)
CALCIUM: 9.3 mg/dL (ref 8.6–10.2)
CO2: 25 mmol/L (ref 20–31)
CREATININE: 0.93 mg/dL (ref 0.50–1.10)
Chloride: 105 mmol/L (ref 98–110)
GFR, EST AFRICAN AMERICAN: 85 mL/min (ref >=60)
GFR, EST NON AFRICAN AMERICAN: 73 mL/min (ref >=60)
Glucose, Bld: 96 mg/dL (ref 65–99)
Potassium: 4.4 mmol/L (ref 3.5–5.3)
Sodium: 137 mmol/L (ref 135–146)
TOTAL PROTEIN: 7.1 g/dL (ref 6.1–8.1)

## 2015-02-09 LAB — POCT SEDIMENTATION RATE: POCT SED RATE: 25 mm/hr — AB (ref 0–22)

## 2015-02-09 LAB — POCT URINE PREGNANCY: PREG TEST UR: NEGATIVE

## 2015-02-09 MED ORDER — CEFTRIAXONE SODIUM 250 MG IJ SOLR
250.0000 mg | Freq: Once | INTRAMUSCULAR | Status: AC
  Administered 2015-02-09: 250 mg via INTRAMUSCULAR

## 2015-02-09 MED ORDER — DOXYCYCLINE HYCLATE 100 MG PO TABS: 100.0000 mg | ORAL_TABLET | Freq: Two times a day (BID) | ORAL | Status: DC

## 2015-02-09 NOTE — Patient Instructions (Signed)
It was great seeing you today.  I am concerned that your symptoms could be related to sexual transmitted disease.  I will do some blood and urine work today to evaluate whether these, and call you with the results.   1. Take doxycycline 100 mg twice a day for 2 weeks.  I will see you prior to the end of this 2 weeks to reevaluate to see if extended antibiotics are need.    Please bring all your medications to every doctors visit  Sign up for My Chart to have easy access to your labs results, and communication with your Primary care physician.  Next Appointment  Please make an appointment with Dr Berkley Harvey before 2 weeks   I look forward to talking with you again at our next visit. If you have any questions or concerns before then, please call the clinic at [phone].  Take Care,   Dr Phill Myron

## 2015-02-09 NOTE — Assessment & Plan Note (Signed)
Suprapubic as well as perianal areas of induration and right lymphadenitis.  Possible boils.  Given patient's history, however, lymphadenitis and recent STD exposure make STD more likely. 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 Berkley Harvey on 02/21/15 for reassessment and extension of antibiotics if needed

## 2015-02-09 NOTE — Assessment & Plan Note (Addendum)
Sexual partner with known hepatitis C and HIV, as well as previous husband was HIV positive.  She has history of HSV, as well as remote history of chlamydia.  - No obvious lesions or ulcers that could be Swabbed for culture - Check HIV, RPR, gonorrhea and chlamydia - Checking acute hepatitis panel

## 2015-02-09 NOTE — Progress Notes (Signed)
   Subjective:    Patient ID: Tracy Rose, female    DOB: 07/24/1965, 50 y.o.   MRN: [id number]  Seen for Same day visit for   CC: Painful perianal swelling and STD exposure  - She reports two painful, swollen areas starting two days ago on her pubic area as well as perianal.  - Also associated with painful right inguinal area  - Reports history of HSV and remote history of chlamydia - Sexual exposure to partner with HIV and Hep C x 2 months, with inconsistent condom use - Denies fevers, chills, night sweats - Denies vaginal discharge, lesions, bleeding - Denies dysuria - Also reports remote history of boils 2 under her arm.  Denies any recent shaving or waxing in her pubic area  Review of Systems   See HPI for ROS. Objective:  BP 126/91 mmHg  Pulse 89  Temp(Src) 98.5 F (36.9 C) (Oral)  Wt 246 lb 9.6 oz (111.857 kg)  LMP 01/24/2015 (Exact Date)  General: NAD Cardiac: RRR, normal heart sounds, no murmurs. 2+ radial and PT pulses bilaterally Respiratory: CTAB, normal effort Abdomen: soft, nontender, nondistended, no hepatic or splenomegaly. Bowel sounds present GU: No obvious vaginal ulcers or lesions.  No vaginal discharge.  Right inguinal area, tender to palpation with mild lymphadenopathy.  1 cm x 1 cm induration in superior pubic mound and 2 cm x 3 cm perianal induration at 7 o'clock     Assessment & Plan:   Exposure to STD Sexual partner with known hepatitis C and HIV, as well as previous husband was HIV positive.  She has history of HSV, as well as remote history of chlamydia.  - No obvious lesions or ulcers that could be Swabbed for culture - Check HIV, RPR, gonorrhea and chlamydia - Checking acute hepatitis panel   Lymphadenopathy, inguinal Suprapubic as well as perianal areas of induration and right lymphadenitis.  Possible boils.  Given patient's history, however, lymphadenitis and recent STD exposure make STD more likely. Concern for HIV and Lymphogranuloma  venereum - Checking HIV, RPR, gonorrhea, chlamydia - Checking CBC, CMET, ESR - Check Upreg - Given ceftriaxone IM 250 today - Prescribed doxycycline 100 mg twice a day 2 weeks - F/u scheduled with Dr. Berkley Harvey on 02/21/15 for reassessment and extension of antibiotics if needed

## 2015-02-10 ENCOUNTER — Telehealth: Payer: Self-pay | Admitting: Family Medicine

## 2015-02-10 LAB — URINE CYTOLOGY ANCILLARY ONLY
CHLAMYDIA, DNA PROBE: NEGATIVE
NEISSERIA GONORRHEA: NEGATIVE

## 2015-02-10 LAB — ACUTE HEP PANEL AND HEP B SURFACE AB
HCV Ab: NEGATIVE
HEP B C IGM: NONREACTIVE
HEP B S AB: NEGATIVE
Hep A IgM: NONREACTIVE
Hepatitis B Surface Ag: NEGATIVE

## 2015-02-10 LAB — HIV ANTIBODY (ROUTINE TESTING W REFLEX): HIV 1&2 Ab, 4th Generation: NONREACTIVE

## 2015-02-10 LAB — RPR

## 2015-02-10 NOTE — Telephone Encounter (Signed)
Called and discussed test results that were all negative as far.  GC and Chlamydia still pending.  - Advised patient to complete abx and follow-up in 2 weeks for recheck as previously advised

## 2015-02-17 ENCOUNTER — Encounter: Payer: Self-pay | Admitting: Neurology

## 2015-02-17 ENCOUNTER — Ambulatory Visit (INDEPENDENT_AMBULATORY_CARE_PROVIDER_SITE_OTHER): Payer: Medicaid Other | Admitting: Neurology

## 2015-02-17 VITALS — BP 162/104 | HR 78 | Ht 66.0 in | Wt 251.8 lb

## 2015-02-17 DIAGNOSIS — F308 Other manic episodes: Secondary | ICD-10-CM | POA: Diagnosis not present

## 2015-02-17 DIAGNOSIS — F988 Other specified behavioral and emotional disorders with onset usually occurring in childhood and adolescence: Secondary | ICD-10-CM

## 2015-02-17 DIAGNOSIS — F909 Attention-deficit hyperactivity disorder, unspecified type: Secondary | ICD-10-CM | POA: Diagnosis not present

## 2015-02-17 MED ORDER — NORTRIPTYLINE HCL 25 MG PO CAPS: 25.0000 mg | ORAL_CAPSULE | Freq: Every day | ORAL | Status: DC

## 2015-02-17 NOTE — Patient Instructions (Signed)
Remember to drink plenty of fluid, eat healthy meals and do not skip any meals. Try to eat protein with a every meal and eat a healthy snack such as fruit or nuts in between meals. Try to keep a regular sleep-wake schedule and try to exercise daily, particularly in the form of walking, 20-30 minutes a day, if you can.   As far as your medications are concerned, I would like to suggest: Nortriptyline 15mg  at bedtime  As far as diagnostic testing: Will refer for formal testing  I would like to see you back as needed, sooner if we need to. Please call us with any interim questions, concerns, problems, updates or refill requests.   Our phone number is [phone]. We also have an after hours call service for urgent matters and there is a physician on-call for urgent questions. For any emergencies you know to call 911 or go to the nearest emergency room
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
## 2015-02-21 ENCOUNTER — Other Ambulatory Visit (HOSPITAL_COMMUNITY)
Admission: RE | Admit: 2015-02-21 | Discharge: 2015-02-21 | Disposition: A | Discharge status: Home / Self Care | Payer: Medicaid Other | Source: Ambulatory Visit | Attending: Family Medicine | Admitting: Family Medicine

## 2015-02-21 ENCOUNTER — Ambulatory Visit: Payer: Self-pay | Admitting: Family Medicine

## 2015-02-21 ENCOUNTER — Encounter: Payer: Self-pay | Admitting: Family Medicine

## 2015-02-21 ENCOUNTER — Ambulatory Visit (INDEPENDENT_AMBULATORY_CARE_PROVIDER_SITE_OTHER): Payer: Medicaid Other | Admitting: Family Medicine

## 2015-02-21 VITALS — BP 162/97 | HR 89 | Temp 98.1°F | Wt 252.0 lb

## 2015-02-21 DIAGNOSIS — R599 Enlarged lymph nodes, unspecified: Secondary | ICD-10-CM | POA: Diagnosis not present

## 2015-02-21 DIAGNOSIS — Z113 Encounter for screening for infections with a predominantly sexual mode of transmission: Secondary | ICD-10-CM | POA: Insufficient documentation

## 2015-02-21 DIAGNOSIS — Z202 Contact with and (suspected) exposure to infections with a predominantly sexual mode of transmission: Secondary | ICD-10-CM | POA: Diagnosis present

## 2015-02-21 DIAGNOSIS — R590 Localized enlarged lymph nodes: Secondary | ICD-10-CM

## 2015-02-21 DIAGNOSIS — N760 Acute vaginitis: Secondary | ICD-10-CM | POA: Diagnosis present

## 2015-02-21 NOTE — Patient Instructions (Signed)
Checking labwork today - HIV, hep C, gonorrhea, chlamydia, trichomonas I will call you with results Finish doxycycline Warm compresses  Return if worsening  Be well, Dr. Ardelia Mems

## 2015-02-21 NOTE — Progress Notes (Signed)
Date of Visit: 02/21/2015   HPI:  Here for recheck of boils in vulvar/perianal area. Has almost completed doxycycline. No n/v, fever, vaginal bleeding, vag discharge, abdominal pain. Saw Dr. Berkley Harvey here at [facility] on 1/18. There was some concern for possible lymphogranuloma venereum. Had a sexual partner a few months ago who she later found out was positive for HIV & hep C. Has abstained from sex since then. Had antibody testing for HIV & hep C at last visit, those were negative. Urine was neg for gc/chlamydia. Boils have improved since being on doxycycline.  ROS: See HPI.  [place]: history of ADHD, chronic headaches, herpes, goiter, tobacco abuse  PHYSICAL EXAM: BP 162/97 mmHg  Pulse 89  Temp(Src) 98.1 F (36.7 C) (Oral)  Wt 252 lb (114.306 kg)  LMP 01/24/2015 (Exact Date) Gen: NAD, pleasant, cooperative GU: normal appearing external genitalia. Areas of previously noted induration are nontender and smaller than previously described. No skin drainage or breakdown.   ASSESSMENT/PLAN:  Lymphadenopathy, inguinal Areas of induration in pelvic area improving after treatment with doxycycline. Encouraged finishing out this course of medication. Recommend warm compresses to further help with resolution.  Exposure to STD Labs last visit unremarkable. Given known exposure, prudent to rule out acute infection (antibodies may not have turned positive yet). - HIV quantitative, HCV quantitative labs today - blind vaginal gc/chl/trich obtained today (more sensitive than urine specimen) - will plan to call patient with results when they are available.   FOLLOW UP: Follow up as needed if symptoms worsen or fail to improve.    [place]. Ardelia Mems, [place]

## 2015-02-22 LAB — CERVICOVAGINAL ANCILLARY ONLY
CHLAMYDIA, DNA PROBE: NEGATIVE
Neisseria Gonorrhea: NEGATIVE
Trichomonas: NEGATIVE

## 2015-02-22 LAB — HIV-1 RNA ULTRAQUANT REFLEX TO GENTYP+: HIV 1 RNA Quant: <20 copies/mL (ref <20)

## 2015-02-23 LAB — HCV RNA QUANT RFLX ULTRA OR GENOTYP: HCV QUANT: NOT DETECTED [IU]/mL (ref <15)

## 2015-02-23 NOTE — Assessment & Plan Note (Addendum)
Areas of induration in pelvic area improving after treatment with doxycycline. Encouraged finishing out this course of medication. Recommend warm compresses to further help with resolution.

## 2015-02-23 NOTE — Assessment & Plan Note (Signed)
Labs last visit unremarkable. Given known exposure, prudent to rule out acute infection (antibodies may not have turned positive yet). - HIV quantitative, HCV quantitative labs today - blind vaginal gc/chl/trich obtained today (more sensitive than urine specimen) - will plan to call patient with results when they are available.

## 2015-03-01 ENCOUNTER — Telehealth: Payer: Self-pay | Admitting: Family Medicine

## 2015-03-01 NOTE — Telephone Encounter (Signed)
Called patient to discuss negative STD results (hep C, hiv viral loads were neg, and gc/chlamydia/trich tests also negative). She was appreciative. She did report that she now has similar swelling/lymphadenopathy around her neck and thinks it may be related to what was going on in her groin area. Advised she schedule appointment to have this evaluated. Recommended she see PCP for better continuity of care.  Appointment scheduled on 2/17 at 9:15am (first available with PCP). Patient appreciative.  Leeanne Rio, MD

## 2015-03-11 ENCOUNTER — Ambulatory Visit: Payer: Self-pay | Admitting: Family Medicine

## 2015-03-23 ENCOUNTER — Ambulatory Visit (HOSPITAL_COMMUNITY): Payer: Self-pay | Admitting: Psychiatry

## 2015-04-16 IMAGING — US US THYROID BIOPSY
1 series · 14 of 14 positions shown · non-contrast
Comparison: Prior thyroid ultrasound 02/15/2014

CLINICAL DATA: 48-year-old female with an enlarging dominant left
lower lobe thyroid nodule which meet consensus criteria for
ultrasound-guided biopsy.

EXAM:
ULTRASOUND GUIDED NEEDLE ASPIRATE BIOPSY OF THE THYROID GLAND

[Series 1: us thyroid biopsy · 0.08mm/px · 14 acquisitions, 14 frames shown]
[im 1/14]
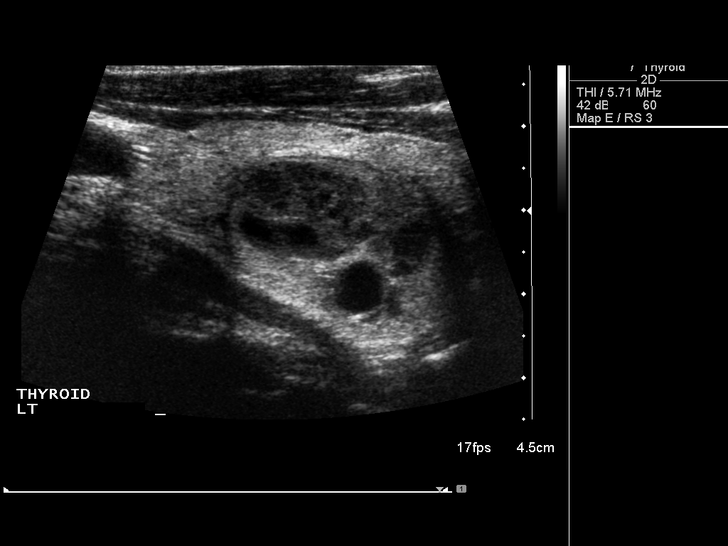
[im 2/14]
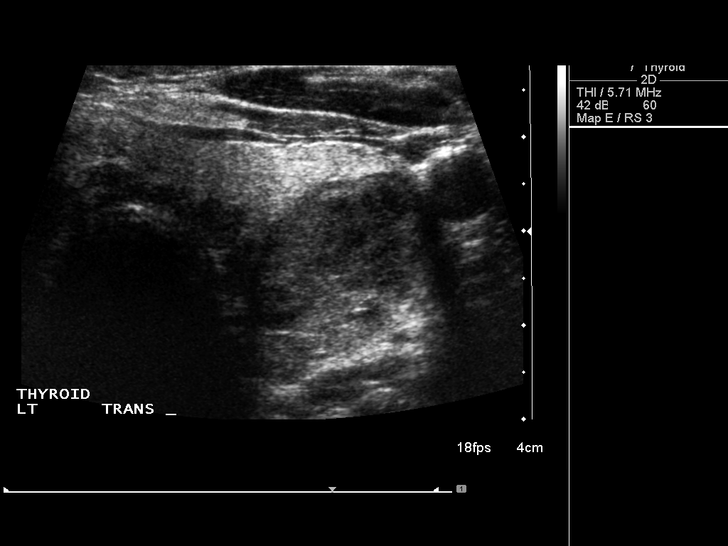
[im 3/14]
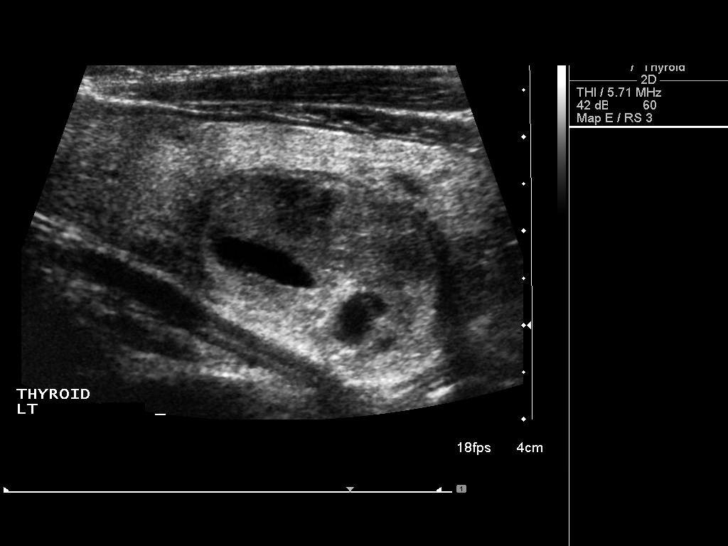
[im 4/14]
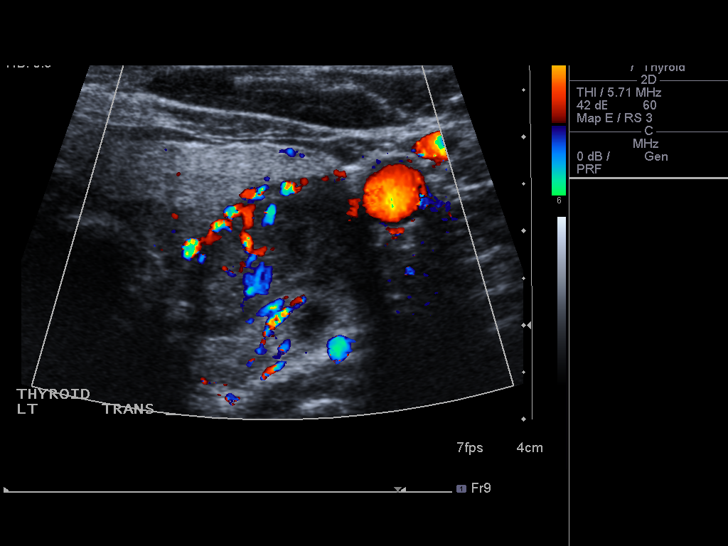
[im 5/14]
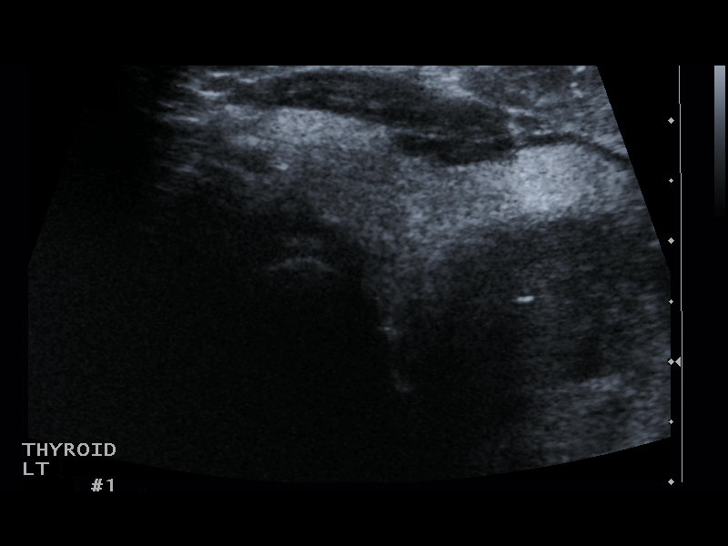
[im 6/14]
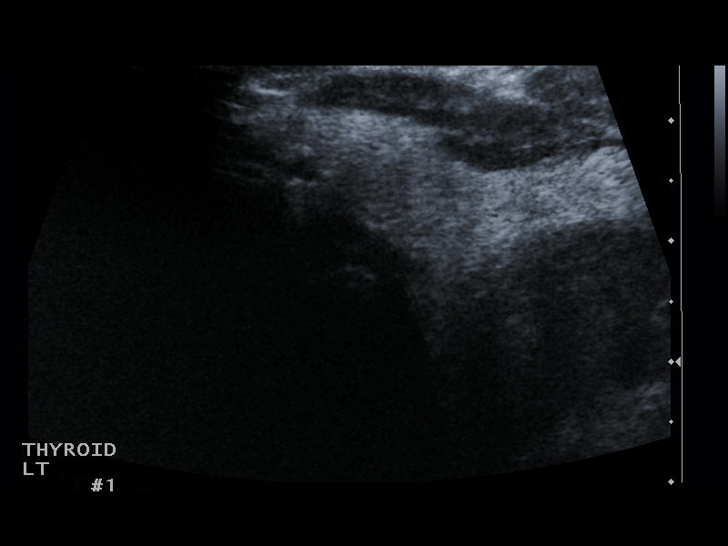
[im 7/14]
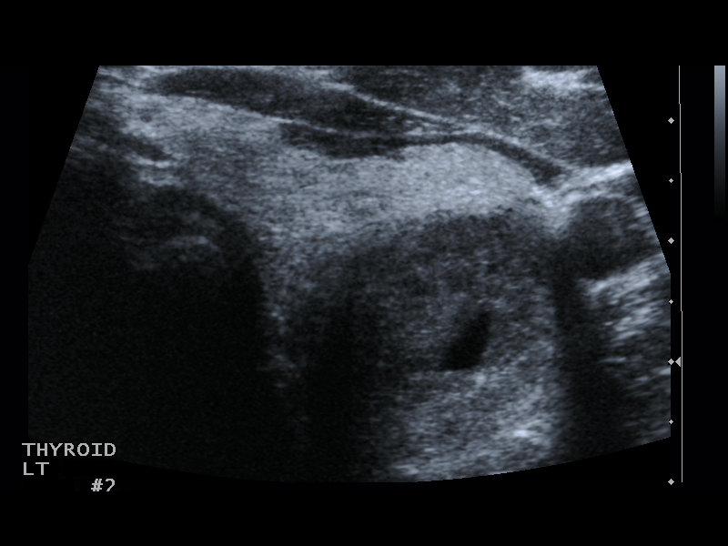
[im 8/14]
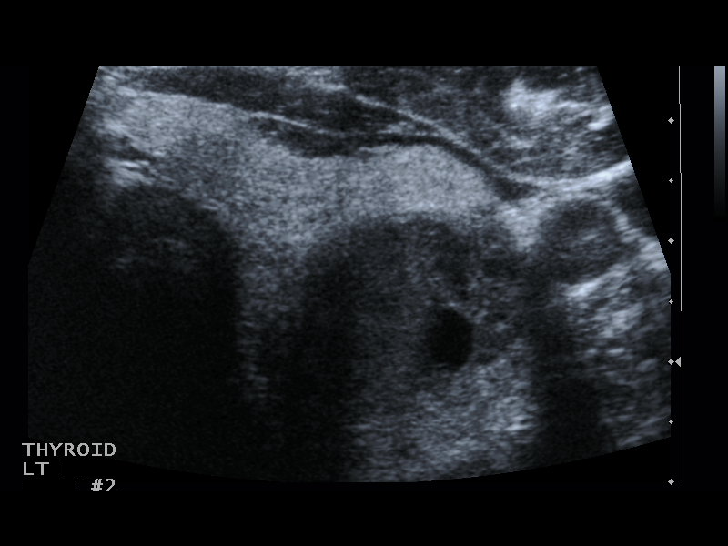
[im 9/14]
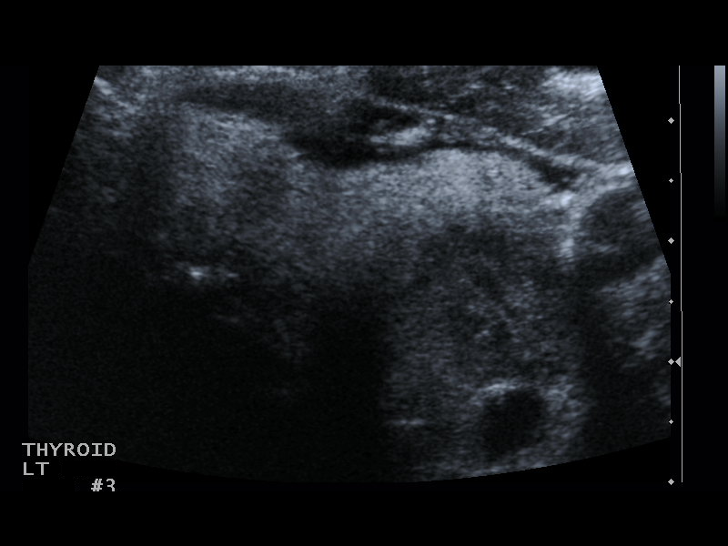
[im 10/14]
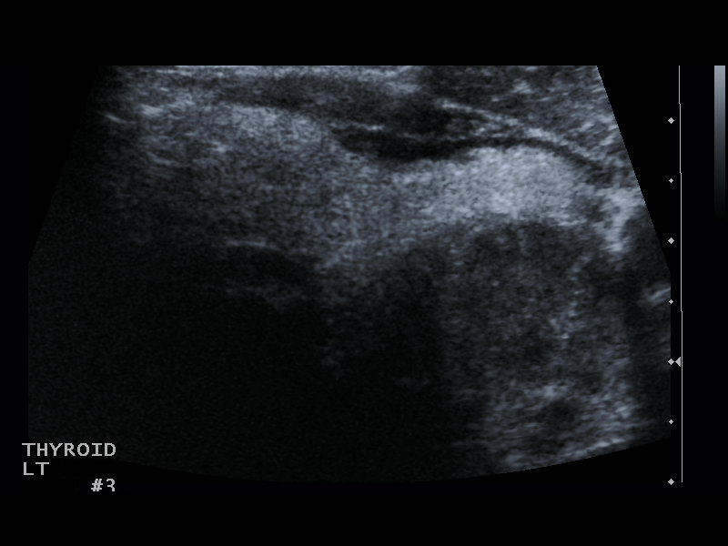
[im 11/14]
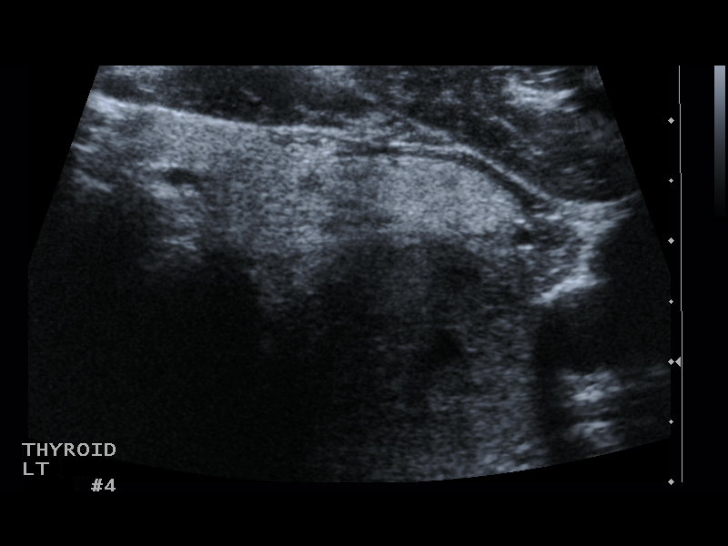
[im 12/14]
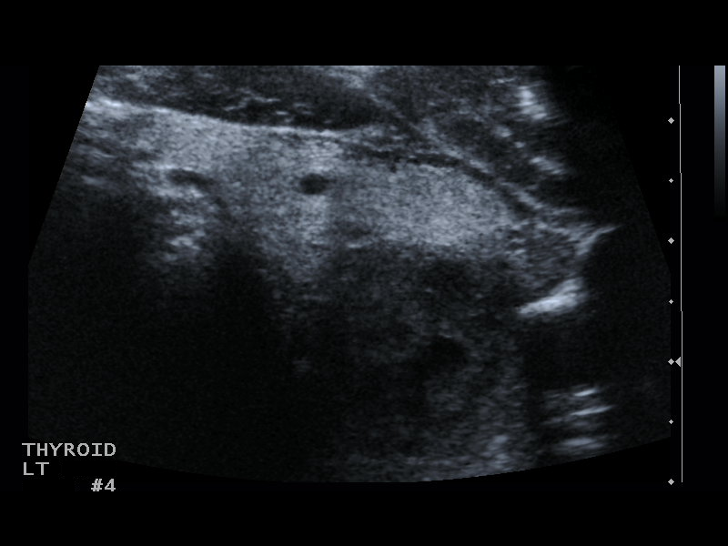
[im 13/14]
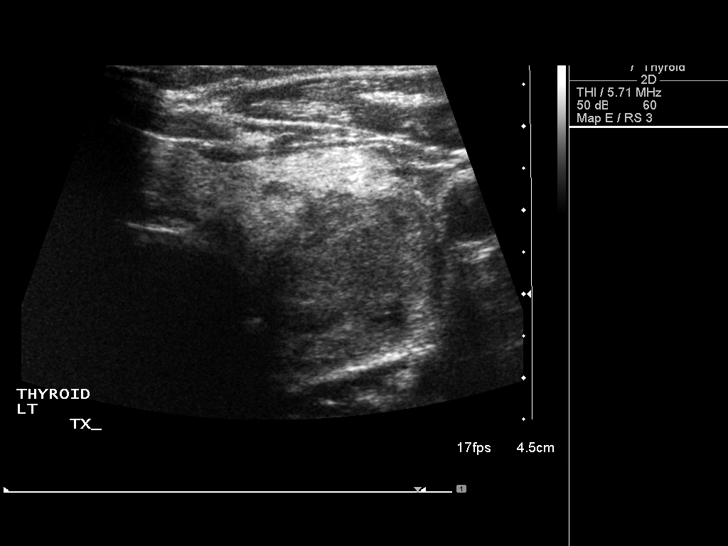
[im 14/14]
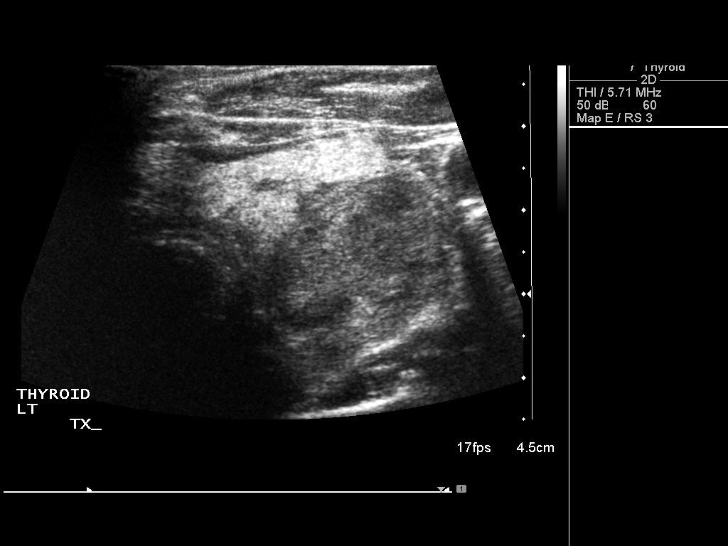

[14 of 14 positions shown; findings below may reference images not displayed]

PROCEDURE:
Thyroid biopsy was thoroughly discussed with the patient and
questions were answered. The benefits, risks, alternatives, and
complications were also discussed. The patient understands and
wishes to proceed with the procedure. Written consent was obtained.



COMPLICATIONS:
None immediate.
IMPRESSION: Ultrasound guided needle aspirate biopsy performed of the left
thyroid nodule.

## 2015-04-28 ENCOUNTER — Ambulatory Visit (HOSPITAL_COMMUNITY)
Admission: EM | Admit: 2015-04-28 | Discharge: 2015-04-28 | Disposition: A | Discharge status: Home / Self Care | Payer: Medicaid Other | Attending: Family Medicine | Admitting: Family Medicine

## 2015-04-28 ENCOUNTER — Encounter (HOSPITAL_COMMUNITY): Payer: Self-pay | Admitting: Emergency Medicine

## 2015-04-28 ENCOUNTER — Ambulatory Visit (HOSPITAL_COMMUNITY): Payer: Medicaid Other

## 2015-04-28 ENCOUNTER — Ambulatory Visit (INDEPENDENT_AMBULATORY_CARE_PROVIDER_SITE_OTHER): Payer: Medicaid Other

## 2015-04-28 DIAGNOSIS — R209 Unspecified disturbances of skin sensation: Secondary | ICD-10-CM

## 2015-04-28 MED ORDER — ACETAMINOPHEN 325 MG PO TABS
650.0000 mg | ORAL_TABLET | Freq: Once | ORAL | Status: AC
  Administered 2015-04-28: 650 mg via ORAL

## 2015-04-28 MED ORDER — ACETAMINOPHEN 325 MG PO TABS
ORAL_TABLET | ORAL | Status: AC
Start: 2015-04-28 — End: 2015-04-28
  Filled 2015-04-28: qty 2

## 2015-04-28 NOTE — ED Notes (Signed)
Patient reports she was looking at a hot casserole dish in oven.  Oven door slammed shut hitting casserole dish.  Dish broke and shattered in oven and on floor.  When dish shattered and hot liquid flew everywhere, patient stepped backward and stepped on broke glass.  Incident happened today

## 2015-04-28 NOTE — Discharge Instructions (Signed)
THERE ARE NO SIGNS OF GLASS IMBEDDED IN EITHER OF YOUR FEET AT BY XRAY  CONTINUE TO SOAK YOUR FEET IN WARM EPSOM SALT WATER  IF THERE ARE ANY TINY SPECKS OF GLASS, THEY WILL WORK THEMSELVES OUT  YOU CAN RETURN TO WORK TOMORROW.

## 2015-04-28 NOTE — ED Provider Notes (Signed)
CSN: [id number]     Arrival date & time 04/28/15  1754 History   First MD Initiated Contact with Patient 04/28/15 1929     Chief Complaint  Patient presents with  . Foreign Body   (Consider location/radiation/quality/duration/timing/severity/associated sxs/prior Treatment) HPI Pt states that pyrex dish broke and she stepped onto to some of the broken bits. Sensation of FB in both feet.  States she soaked her feet before arrival and was able to remove particles concerned about imbedded glass.  Past Medical History  Diagnosis Date  . Tobacco abuse 09/08/2010  . OBESITY, NOS 03/21/2006        . GOITER, MULTINODULAR 09/05/2007    Dr. Zada Girt [facility]  [place] 2011: Stable to slightly smaller bilateral thyroid nodules since 09/04/2007. No new or enlarging thyroid nodules identified. Biopsy 08/2007: non neoplastic goiter   . Genital herpes 12/12/2011  . Mild depression 12/15/2013   Past Surgical History  Procedure Laterality Date  . Wisdom tooth extraction      @ age 47  . Radiology with anesthesia N/A 01/26/2014    Procedure: MRA NECK, MRI BRAIN WITH AND WITHOUT CONTRAST ;  Surgeon: Medication Radiologist, MD;  Location: [facility];  Service: Radiology;  Laterality: N/A;   Family History  Problem Relation Age of Onset  . Arthritis Mother   . Diabetes Father   . Arthritis Father   . Hypertension Father   . Hyperlipidemia Father   . Heart disease Father   . Stroke Father   . Cancer Maternal Grandfather   . Migraines Neg Hx    Social History  Substance Use Topics  . Smoking status: Current Every Day Smoker -- 0.15 packs/day for 25 years    Types: Cigarettes    Last Attempt to Quit: 03/31/2013  . Smokeless tobacco: Former Systems developer    Quit date: 10/28/2010  . Alcohol Use: No   OB History    [place] Para Term Preterm AB TAB SAB Ectopic Multiple Living   4 2 2  0 2 2 0 0 0 1     Review of Systems Glass FB feet Allergies  Review of patient's allergies indicates no known  allergies.  Home Medications   Prior to Admission medications   Medication Sig Start Date End Date Taking? Authorizing Provider  nortriptyline (PAMELOR) 25 MG capsule Take 1 capsule (25 mg total) by mouth at bedtime. 02/17/15  Yes Melvenia Beam, MD  venlafaxine XR (EFFEXOR XR) 75 MG 24 hr capsule Take 1 capsule (75 mg total) by mouth 2 (two) times daily. 08/11/14  Yes Melvenia Beam, MD  acetaminophen (TYLENOL) 325 MG tablet Take 650 mg by mouth every 6 (six) hours as needed (pain).     Historical Provider, MD  acyclovir (ZOVIRAX) 400 MG tablet Take 1 tablet (400 mg total) by mouth 2 (two) times daily. 04/09/14   Kinnie Feil, MD  B Complex-C (B-COMPLEX WITH VITAMIN C) tablet Take 1 tablet by mouth daily.    Historical Provider, MD  doxycycline (VIBRA-TABS) 100 MG tablet Take 1 tablet (100 mg total) by mouth 2 (two) times daily. 02/09/15   Olam Idler, MD  naproxen (NAPROSYN) 500 MG tablet Take 1 tablet (500 mg total) by mouth 2 (two) times daily. 09/20/14   Antonietta Breach, PA-C  OVER THE COUNTER MEDICATION Take 2 tablets by mouth daily. ginko biloba    Historical Provider, MD  SUMAtriptan (IMITREX) 50 MG tablet 1 tablet as needed for headache. May repeat in 2 hours x  1 dose if headache persists or recurs. 03/19/14   Coral Spikes, DO  traMADol (ULTRAM) 50 MG tablet Take 100 mg by mouth 3 (three) times daily as needed for moderate pain. Reported on 02/09/2015    Historical Provider, MD  vitamin B-12 (CYANOCOBALAMIN) 100 MCG tablet Take 100 mcg by mouth daily.    Historical Provider, MD   Meds Ordered and Administered this Visit   Medications  acetaminophen (TYLENOL) tablet 650 mg (650 mg Oral Given 04/28/15 2004)    BP 152/104 mmHg  Pulse 98  Temp(Src) 99.3 F (37.4 C) (Oral)  Resp 16  SpO2 98%  LMP 04/06/2015 No data found.   Physical Exam NURSES NOTES AND VITAL SIGNS REVIEWED. CONSTITUTIONAL: Well developed, well nourished, no acute distress HEENT: normocephalic, atraumatic EYES:  Conjunctiva normal NECK:normal ROM, supple, no adenopathy PULMONARY:No respiratory distress, normal effort MUSCULOSKELETAL: Normal ROM of all extremities, both feet no signs of FB noted, no tenderness no lacerations.  SKIN: warm and dry without rash PSYCHIATRIC: Mood and affect, behavior are normal  ED Course  Procedures (including critical care time)  Labs Review Labs Reviewed - No data to display  Imaging Review Dg Foot Complete Left  04/28/2015  CLINICAL DATA:  Stepped on glass tonight EXAM: LEFT FOOT - COMPLETE 3+ VIEW COMPARISON:  None. FINDINGS: No fracture or dislocation.  No radio-opaque foreign bodies. IMPRESSION: Radiographically negative for acute findings Electronically Signed   By: Skipper Cliche M.D.   On: 04/28/2015 20:13   Dg Foot Complete Right  04/28/2015  CLINICAL DATA:  stepped on glass with both feet this evening EXAM: RIGHT FOOT COMPLETE - 3+ VIEW COMPARISON:  None. FINDINGS: No fracture or dislocation.  No radio-opaque foreign body. IMPRESSION: Radiographically negative for acute findings. Electronically Signed   By: Skipper Cliche M.D.   On: 04/28/2015 20:16     Visual Acuity Review  Right Eye Distance:   Left Eye Distance:   Bilateral Distance:    Right Eye Near:   Left Eye Near:    Bilateral Near:      REVIEW OF XR WITH PATIENT: NO FB  MDM   1. Sensation disturbance of skin     Patient is reassured that there are no issues that require transfer to higher level of care at this time or additional tests. Patient is advised to continue home symptomatic treatment. Patient is advised that if there are new or worsening symptoms to attend the emergency department, contact primary care provider, or return to UC. Instructions of care provided discharged home in stable condition.    THIS NOTE WAS GENERATED USING A VOICE RECOGNITION SOFTWARE PROGRAM. ALL REASONABLE EFFORTS  WERE MADE TO PROOFREAD THIS DOCUMENT FOR ACCURACY.  I have verbally reviewed the  discharge instructions with the patient. A printed AVS was given to the patient.  All questions were answered prior to discharge.      Konrad Felix, PA 04/28/15 2031

## 2015-04-28 NOTE — ED Notes (Signed)
tdap 2014

## 2015-06-07 ENCOUNTER — Emergency Department (HOSPITAL_COMMUNITY)
Admission: EM | Admit: 2015-06-07 | Discharge: 2015-06-07 | Disposition: A | Discharge status: Home / Self Care | Payer: Medicaid Other | Attending: Emergency Medicine | Admitting: Emergency Medicine

## 2015-06-07 ENCOUNTER — Encounter (HOSPITAL_COMMUNITY): Payer: Self-pay | Admitting: *Deleted

## 2015-06-07 ENCOUNTER — Other Ambulatory Visit: Payer: Self-pay

## 2015-06-07 ENCOUNTER — Emergency Department (HOSPITAL_COMMUNITY): Payer: Medicaid Other

## 2015-06-07 DIAGNOSIS — R0981 Nasal congestion: Secondary | ICD-10-CM | POA: Diagnosis not present

## 2015-06-07 DIAGNOSIS — E669 Obesity, unspecified: Secondary | ICD-10-CM | POA: Diagnosis not present

## 2015-06-07 DIAGNOSIS — F329 Major depressive disorder, single episode, unspecified: Secondary | ICD-10-CM | POA: Insufficient documentation

## 2015-06-07 DIAGNOSIS — R05 Cough: Secondary | ICD-10-CM | POA: Insufficient documentation

## 2015-06-07 DIAGNOSIS — H53149 Visual discomfort, unspecified: Secondary | ICD-10-CM | POA: Insufficient documentation

## 2015-06-07 DIAGNOSIS — Z791 Long term (current) use of non-steroidal anti-inflammatories (NSAID): Secondary | ICD-10-CM | POA: Insufficient documentation

## 2015-06-07 DIAGNOSIS — R079 Chest pain, unspecified: Secondary | ICD-10-CM | POA: Diagnosis not present

## 2015-06-07 DIAGNOSIS — Z79899 Other long term (current) drug therapy: Secondary | ICD-10-CM | POA: Diagnosis not present

## 2015-06-07 DIAGNOSIS — R519 Headache, unspecified: Secondary | ICD-10-CM

## 2015-06-07 DIAGNOSIS — R51 Headache: Secondary | ICD-10-CM | POA: Diagnosis not present

## 2015-06-07 DIAGNOSIS — R202 Paresthesia of skin: Secondary | ICD-10-CM | POA: Insufficient documentation

## 2015-06-07 DIAGNOSIS — Z8619 Personal history of other infectious and parasitic diseases: Secondary | ICD-10-CM | POA: Diagnosis not present

## 2015-06-07 DIAGNOSIS — F17210 Nicotine dependence, cigarettes, uncomplicated: Secondary | ICD-10-CM | POA: Diagnosis not present

## 2015-06-07 LAB — CBC
HEMATOCRIT: 41.7 % (ref 36.0–46.0)
HEMOGLOBIN: 13.1 g/dL (ref 12.0–15.0)
MCH: 26.1 pg (ref 26.0–34.0)
MCHC: 31.4 g/dL (ref 30.0–36.0)
MCV: 83.1 fL (ref 78.0–100.0)
Platelets: 317 10*3/uL (ref 150–400)
RBC: 5.02 MIL/uL (ref 3.87–5.11)
RDW: 14.4 % (ref 11.5–15.5)
WBC: 5.7 10*3/uL (ref 4.0–10.5)

## 2015-06-07 LAB — BASIC METABOLIC PANEL
ANION GAP: 11 (ref 5–15)
BUN: 10 mg/dL (ref 6–20)
CHLORIDE: 105 mmol/L (ref 101–111)
CO2: 23 mmol/L (ref 22–32)
Calcium: 9 mg/dL (ref 8.9–10.3)
Creatinine, Ser: 1.07 mg/dL — ABNORMAL HIGH (ref 0.44–1.00)
Glucose, Bld: 128 mg/dL — ABNORMAL HIGH (ref 65–99)
POTASSIUM: 3.7 mmol/L (ref 3.5–5.1)
SODIUM: 139 mmol/L (ref 135–145)

## 2015-06-07 LAB — I-STAT TROPONIN, ED: Troponin i, poc: 0 ng/mL (ref 0.00–0.08)

## 2015-06-07 LAB — TROPONIN I

## 2015-06-07 MED ORDER — DIPHENHYDRAMINE HCL 50 MG/ML IJ SOLN
25.0000 mg | Freq: Once | INTRAMUSCULAR | Status: AC
  Administered 2015-06-07: 25 mg via INTRAVENOUS
  Filled 2015-06-07: qty 1

## 2015-06-07 MED ORDER — ASPIRIN 81 MG PO CHEW
162.0000 mg | CHEWABLE_TABLET | Freq: Once | ORAL | Status: AC
  Administered 2015-06-07: 162 mg via ORAL
  Filled 2015-06-07: qty 2

## 2015-06-07 MED ORDER — METOCLOPRAMIDE HCL 5 MG/ML IJ SOLN
10.0000 mg | Freq: Once | INTRAMUSCULAR | Status: AC
  Administered 2015-06-07: 10 mg via INTRAVENOUS
  Filled 2015-06-07: qty 2

## 2015-06-07 NOTE — ED Notes (Signed)
Pt c/o L sided CP onset this morning at 1am, pt reports pain radiating to L arm, pt c/o SOB, pt c/o HA, pt reports no pain relief post taking x 2 ASA, pt A&O x4

## 2015-06-07 NOTE — ED Provider Notes (Signed)
CSN: [id number]     Arrival date & time 06/07/15  4235 History   First MD Initiated Contact with Patient 06/07/15 [phone]     Chief Complaint  Patient presents with  . Chest Pain   Patient is a 50 y.o. female presenting with chest pain and headaches. The history is provided by the patient.  Chest Pain Pain location:  L chest Pain quality comment:  Squeezing Pain radiates to:  L arm and neck Pain radiates to the back: no   Onset quality:  Gradual Duration:  8 hours Timing:  Intermittent Progression:  Unchanged Chronicity:  New Context: at rest   Context comment:  She has been coughing frequently in past week, felt like it was sinus congestion Relieved by: being still. Exacerbated by: palpation. Ineffective treatments:  Nitroglycerin and aspirin Associated symptoms: cough (chest hurst when she coughs) and headache   Associated symptoms: no abdominal pain, no anorexia, no back pain, no diaphoresis, no dizziness, no fever, no heartburn, no lower extremity edema, no nausea, no near-syncope, no orthopnea, no palpitations, no shortness of breath, no syncope and no weakness   Risk factors: obesity and smoking   Risk factors: no birth control, no coronary artery disease, no diabetes mellitus, no high cholesterol, no hypertension, no immobilization and no prior DVT/PE   Risk factors comment:  No prior stress test Headache Location: left frontal. Quality: aching. Onset quality:  Gradual Duration: 6-8 hours. Timing:  Intermittent Chronicity:  New Similar to prior headaches: no (other headaches are more generalized)   Context: bright light   Associated symptoms: congestion, cough (chest hurst when she coughs), photophobia (like prior migraines), tingling (left face) and URI   Associated symptoms: no abdominal pain, no back pain, no blurred vision, no dizziness, no fever, no focal weakness, no loss of balance, no nausea, no near-syncope, no seizures, no syncope and no weakness   Associated  symptoms comment:  No eye pain, no eye injection, no rhinorrhea or lacrimation   Left arm tingling has been documented previously with headaches. Last MRA neg for acute disease; minimal narrowing of posterior vessels.    Past Medical History  Diagnosis Date  . Tobacco abuse 09/08/2010  . OBESITY, NOS 03/21/2006        . GOITER, MULTINODULAR 09/05/2007    Dr. Cliffton Asters [facility]in[facility]  [place] 2011: Stable to slightly smaller bilateral thyroid nodules since 09/04/2007. No new or enlarging thyroid nodules identified. Biopsy 08/2007: non neoplastic goiter   . Genital herpes 12/12/2011  . Mild depression 12/15/2013   Past Surgical History  Procedure Laterality Date  . Wisdom tooth extraction      @ age 88  . Radiology with anesthesia N/A 01/26/2014    Procedure: MRA NECK, MRI BRAIN WITH AND WITHOUT CONTRAST ;  Surgeon: Medication Radiologist, MD;  Location: [facility];  Service: Radiology;  Laterality: N/A;   Family History  Problem Relation Age of Onset  . Arthritis Mother   . Diabetes Father   . Arthritis Father   . Hypertension Father   . Hyperlipidemia Father   . Heart disease Father   . Stroke Father   . Cancer Maternal Grandfather   . Migraines Neg Hx    Social History  Substance Use Topics  . Smoking status: Current Every Day Smoker -- 0.20 packs/day for 25 years    Types: Cigarettes  . Smokeless tobacco: Former Neurosurgeon    Quit date: 10/28/2010  . Alcohol Use: No   OB History    [place]  Para Term Preterm AB TAB SAB Ectopic Multiple Living   4 2 2  0 2 2 0 0 0 1     Review of Systems  Constitutional: Negative for fever and diaphoresis.  HENT: Positive for congestion.   Eyes: Positive for photophobia (like prior migraines). Negative for blurred vision.  Respiratory: Positive for cough (chest hurst when she coughs). Negative for shortness of breath.   Cardiovascular: Positive for chest pain. Negative for palpitations, orthopnea, syncope and near-syncope.  Gastrointestinal:  Negative for heartburn, nausea, abdominal pain and anorexia.  Musculoskeletal: Negative for back pain.  Neurological: Positive for headaches. Negative for dizziness, focal weakness, seizures, syncope, speech difficulty, weakness, light-headedness and loss of balance.  Psychiatric/Behavioral: Negative for confusion.  All other systems reviewed and are negative.   Allergies  Review of patient's allergies indicates no known allergies.  Home Medications   Prior to Admission medications   Medication Sig Start Date End Date Taking? Authorizing Provider  acetaminophen (TYLENOL) 325 MG tablet Take 650 mg by mouth every 6 (six) hours as needed (pain).    Yes Historical Provider, MD  acyclovir (ZOVIRAX) 400 MG tablet Take 1 tablet (400 mg total) by mouth 2 (two) times daily. 04/09/14  Yes Kinnie Feil, MD  B Complex-C (B-COMPLEX WITH VITAMIN C) tablet Take 1 tablet by mouth daily.   Yes Historical Provider, MD  naproxen (NAPROSYN) 500 MG tablet Take 1 tablet (500 mg total) by mouth 2 (two) times daily. 09/20/14  Yes Antonietta Breach, PA-C  nortriptyline (PAMELOR) 25 MG capsule Take 1 capsule (25 mg total) by mouth at bedtime. 02/17/15  Yes Melvenia Beam, MD  venlafaxine XR (EFFEXOR XR) 75 MG 24 hr capsule Take 1 capsule (75 mg total) by mouth 2 (two) times daily. 08/11/14  Yes Melvenia Beam, MD  vitamin B-12 (CYANOCOBALAMIN) 100 MCG tablet Take 100 mcg by mouth daily.   Yes Historical Provider, MD  OVER THE COUNTER MEDICATION Take 2 tablets by mouth daily. ginko biloba    Historical Provider, MD  SUMAtriptan (IMITREX) 50 MG tablet 1 tablet as needed for headache. May repeat in 2 hours x 1 dose if headache persists or recurs. Patient not taking: Reported on 06/07/2015 03/19/14   Jayce G Cook, DO   BP 133/77 mmHg  Pulse 77  Temp(Src) 98 F (36.7 C) (Oral)  Resp 16  Ht 5' 5.5" (1.664 m)  SpO2 99%  LMP 06/07/2015 Physical Exam  Constitutional: She is oriented to person, place, and time. She appears  well-developed and well-nourished. No distress.  Very comfortable. nml speech  HENT:  Head: Normocephalic and atraumatic.  Nose: Nose normal.  Eyes: Conjunctivae are normal. Pupils are equal, round, and reactive to light.  No conj pallor  Neck: Normal range of motion. Neck supple. No JVD present. No tracheal deviation present.  No rigidity  Cardiovascular: Normal rate, regular rhythm and normal heart sounds.   No murmur heard. Pulmonary/Chest: Effort normal and breath sounds normal. No respiratory distress. She has no rales. She exhibits tenderness (exquisitely reproduces pain exactly the same chest pain what she was feeling; withdrew briskly from me on palpation).  Abdominal: Soft. Bowel sounds are normal. She exhibits no distension and no mass. There is no tenderness.  Musculoskeletal: Normal range of motion. She exhibits no edema.  No lower extremity edema, calf tenderness, warmth, erythema or palpable cords  Neurological: She is alert and oriented to person, place, and time.  Alert and oriented x3. CN 3-12 tested and without deficit  (  exception: left face feels tingling, subjective sensory change to left compared to right with light touch, sensation is present however). 5/5 muscle strength in all extremities with flexion and extension.  Normal bulk and tone.  No sensory deficit to light touch.  gait normal.  No pronator drift.  Normal heel-to-shin and finger-to-nose.  Toes flexor bilaterally.   Skin: Skin is warm and dry. No rash noted. No pallor.  Psychiatric: She has a normal mood and affect.  Nursing note and vitals reviewed.   ED Course  Procedures (including critical care time) Labs Review Labs Reviewed  BASIC METABOLIC PANEL - Abnormal; Notable for the following:    Glucose, Bld 128 (*)    Creatinine, Ser 1.07 (*)    All other components within normal limits  CBC  TROPONIN I  I-STAT TROPOININ, ED    Imaging Review Dg Chest 2 View  06/07/2015  CLINICAL DATA:  Left-sided  chest pain with intermittent shortness of breath EXAM: CHEST  2 VIEW COMPARISON:  March 13, 2010 FINDINGS: Lungs are clear. Heart size and pulmonary vascularity are normal. No adenopathy. No pneumothorax. No bone lesions. IMPRESSION: No edema or consolidation. Electronically Signed   By: Lowella Grip III M.D.   On: 06/07/2015 09:02   I have personally reviewed and evaluated these images and lab results as part of my medical decision-making.   EKG Interpretation   Date/Time:  Tuesday Jun 07 2015 08:31:35 EDT Ventricular Rate:  99 PR Interval:  118 QRS Duration: 78 QT Interval:  364 QTC Calculation: 467 R Axis:   56 Text Interpretation:  Normal sinus rhythm Normal ECG Confirmed by RAY MD,  Andee Poles [id number]) on 06/07/2015 8:36:12 AM Also confirmed by RAY MD,  Andee Poles [phone]), editor [place] CT, Leda Gauze [phone])  on 06/07/2015 9:22:11  AM      MDM   Final diagnoses:  Left sided chest pain  Acute nonintractable headache, unspecified headache type    PERC neg, doubt PE. Not on estrogens, no s/s DVT, no hypoxia, no hemoptysis.  CXR wo PNA or PTX, no preceding infx sx.   EKG: NSR, nml intervals, no ST segment changes or TWI.  No evidence of WPW or dysrthymia. No STEMI. Not cw pericarditis.   HEART score 2 (age, risk factors), at most 3 if moderately suspicous HPI.  Benign EKG, delta trop neg, doubt acute coronary event  Has hx of headaches and is on migraine meds. This HA feels somewhat different. Neuro exam without objective deficits.  BP elevated. Not on antiHTN.  Will speak to PCP about repeat BP measurements.    On repeat exams, she was without headache, had no neuro deficits. No sensory loss on exam.  I have very low suspicion for SAH, mass, CVA.  She has had prior headaches with left sided numbness documented.   Counseled on return precautions for worseing HA, stroke sx, chest pain.  Tammy Sours, MD 06/07/15 1637  Pattricia Boss, MD 06/08/15 [phone]

## 2015-06-07 NOTE — ED Notes (Signed)
Patient transported to X-ray 

## 2015-06-08 ENCOUNTER — Other Ambulatory Visit: Payer: Self-pay | Admitting: Family Medicine

## 2015-06-08 ENCOUNTER — Ambulatory Visit (INDEPENDENT_AMBULATORY_CARE_PROVIDER_SITE_OTHER): Payer: Medicaid Other | Admitting: Family Medicine

## 2015-06-08 ENCOUNTER — Ambulatory Visit
Admission: RE | Admit: 2015-06-08 | Discharge: 2015-06-08 | Disposition: A | Discharge status: Home / Self Care | Payer: Medicaid Other | Source: Ambulatory Visit | Attending: Family Medicine | Admitting: Family Medicine

## 2015-06-08 VITALS — BP 168/119 | HR 94 | Temp 98.0°F | Ht 66.0 in | Wt 259.5 lb

## 2015-06-08 DIAGNOSIS — N644 Mastodynia: Secondary | ICD-10-CM

## 2015-06-08 DIAGNOSIS — N63 Unspecified lump in unspecified breast: Secondary | ICD-10-CM

## 2015-06-08 DIAGNOSIS — I10 Essential (primary) hypertension: Secondary | ICD-10-CM

## 2015-06-08 DIAGNOSIS — R079 Chest pain, unspecified: Secondary | ICD-10-CM | POA: Diagnosis not present

## 2015-06-08 DIAGNOSIS — Z720 Tobacco use: Secondary | ICD-10-CM | POA: Diagnosis not present

## 2015-06-08 MED ORDER — HYDROCHLOROTHIAZIDE 12.5 MG PO TABS: 12.5000 mg | ORAL_TABLET | Freq: Every day | ORAL | Status: DC

## 2015-06-08 NOTE — Patient Instructions (Addendum)
Getting you set up for mammogram and ultrasound today of your breast Let's wait on these results before deciding about cardiology referral.  For blood pressure, start HCTZ 12.5mg  daily. Sent this in. Follow up in 2-3 weeks with PCP to recheck blood pressure and see how you are doing. Will need to check kidney function at that visit as well.  For smoking: -chew sugar free gum or sugar free candy whenever you feel you might smoke  Be well, Dr. Ardelia Mems

## 2015-06-08 NOTE — Progress Notes (Signed)
Date of Visit: 06/08/2015   HPI:  Patient presents for a same day appointment to discuss elevated blood pressures.  Was seen in ED yesterday for chest pain and headache. Had EKG and troponins that were unremarkable. Had tenderness of chest with palpation, thus symptoms deemed to be due to chest wall pain. Blood pressures were elevated in the ED and they asked her to follow up with [facility] for those. Patient states she knows her blood pressure has been elevated for some time now and she is now willing to take medication for it. Reports chest now feels good this morning.  The episode of chest pain she had caused her left arm to go numb. Had squeezing/pressure in her chest. Felt shortness of breath. Has family history of heart attacks, strokes, CHF. Dad died from cardiac issues at 77.  Breast ROS - marked tenderness and masslike area of left breast noted on exam today. Denies nipple discharge. No fevers.  Does feel like breasts have been getting bigger. Palpation of breast reproduces the severe pain she was having that sent her to the ED.  ROS: See HPI  [place]: hx of ADHD, multinodular goiter, depression, post concussive syndrome, obesity, tobacco abuse, genital herpes  PHYSICAL EXAM: BP 144/101 mmHg  Pulse 94  Temp(Src) 98 F (36.7 C) (Oral)  Ht 5\' 6"  (1.676 m)  Wt 259 lb 8 oz (117.708 kg)  BMI 41.90 kg/m2  LMP 06/06/2015 Gen: NAD, pleasant, cooperative HEENT: normocephalic, atraumatic, moist mucous membranes  Heart: regular rate and rhythm, no murmur.  Lungs: clear to auscultation bilaterally, normal work of breathing  Abdomen: soft nontender to palpation Breasts: bilateral breasts very large. R breast nontender to palpation. No nipple drainage. No R axillary lymphadenopathy. Left breast exquisitely tender to palpation. No redness or warmth. Area of focal fullness and tenderness at the 7-8oclock position that causes notable skin dimpling when breast is allowed to hang  laterally over patient's torso. Nipple normal without drainage. Breast is diffusely tender to palpation throughout. No left sided axillary lymphadenopathy appreciated.  ASSESSMENT/PLAN:  Breast mass Severe left breast tenderness appreciated today on exam, which prompted full breast exam. Exam significant for possible mass in left breast with diffuse tenderness throughout breast. Patient has never had mammogram before. Plan: - stat bilateral diagnostic mammogram - stat left breast ultrasound - these will both be done today. Spoke with insurance company to obtain prior authorization. - further workup pending these results. - patient agreeable to this plan.  Chest pain Has risk factors for CAD - hypertension, obesity, tobacco abuse Likely would benefit from cardiac risk assessment (ie stress testing) given the typical nature of the symptoms that sent her to the ED (pressure chest pain, shortness of breath, numbness of left arm) and her family history Had planned to refer to cardiology, but after finding that left breast is markedly tender and has a mass, this may explain the symptoms she reported. It reproduced the pain she was having. For now, will proceed with working up breast mass/pain and hold on cardiology referral for now. Treating cardiac risk factors - hypertension, encouraging smoking cessation, will check lipids at future visit  Tobacco abuse Discussed techniques for smoking cessation today She is down to 3-4 cigarettes per day and is motivated to quit Advised of minimal physiologic addiction with this dosing Recommend behavior modification (ie chew gum, candy) whenever she would otherwise smoke She will try this & follow up in several weeks.  Essential hypertension, benign Blood pressures elevated today  and in the past. Makes new diagnosis of hypertension Will start with HCTZ 12.5mg  daily. Follow up in a couple weeks to recheck blood pressure. Needs renal function checked at  that visit. Also needs lipids checked but unable to perform today as she is nonfasting.     FOLLOW UP: Follow up in 2-3 weeks for above issues with either PCP or myself.  [place]. Ardelia Mems, [place]

## 2015-06-10 ENCOUNTER — Encounter: Payer: Self-pay | Admitting: Family Medicine

## 2015-06-10 DIAGNOSIS — I10 Essential (primary) hypertension: Secondary | ICD-10-CM

## 2015-06-10 DIAGNOSIS — N63 Unspecified lump in unspecified breast: Secondary | ICD-10-CM | POA: Insufficient documentation

## 2015-06-10 DIAGNOSIS — N644 Mastodynia: Secondary | ICD-10-CM

## 2015-06-10 HISTORY — DX: Mastodynia: N64.4

## 2015-06-10 HISTORY — DX: Essential (primary) hypertension: I10

## 2015-06-10 NOTE — Assessment & Plan Note (Signed)
Has risk factors for CAD - hypertension, obesity, tobacco abuse Likely would benefit from cardiac risk assessment (ie stress testing) given the typical nature of the symptoms that sent her to the ED (pressure chest pain, shortness of breath, numbness of left arm) and her family history Had planned to refer to cardiology, but after finding that left breast is markedly tender and has a mass, this may explain the symptoms she reported. It reproduced the pain she was having. For now, will proceed with working up breast mass/pain and hold on cardiology referral for now. Treating cardiac risk factors - hypertension, encouraging smoking cessation, will check lipids at future visit

## 2015-06-10 NOTE — Assessment & Plan Note (Signed)
Blood pressures elevated today and in the past. Makes new diagnosis of hypertension Will start with HCTZ 12.5mg  daily. Follow up in a couple weeks to recheck blood pressure. Needs renal function checked at that visit. Also needs lipids checked but unable to perform today as she is nonfasting.

## 2015-06-10 NOTE — Assessment & Plan Note (Signed)
Severe left breast tenderness appreciated today on exam, which prompted full breast exam. Exam significant for possible mass in left breast with diffuse tenderness throughout breast. Patient has never had mammogram before. Plan: - stat bilateral diagnostic mammogram - stat left breast ultrasound - these will both be done today. Spoke with insurance company to obtain prior authorization. - further workup pending these results. - patient agreeable to this plan.

## 2015-06-10 NOTE — Assessment & Plan Note (Signed)
Discussed techniques for smoking cessation today She is down to 3-4 cigarettes per day and is motivated to quit Advised of minimal physiologic addiction with this dosing Recommend behavior modification (ie chew gum, candy) whenever she would otherwise smoke She will try this & follow up in several weeks.

## 2015-06-10 NOTE — Progress Notes (Signed)
Patient ID: Tracy Rose, female   DOB: 1965/11/18, 50 y.o.   MRN: [id number] I got mammogram report for patient. Possible irregular mass. Biopsy recommended. I spoke with patient about result. All questions were answered. She has appointment scheduled for next Tuesday. Follow up with me soon.

## 2015-06-13 ENCOUNTER — Telehealth: Payer: Self-pay | Admitting: Family Medicine

## 2015-06-13 NOTE — Telephone Encounter (Signed)
Thanks a lot 

## 2015-06-13 NOTE — Telephone Encounter (Signed)
Called patient to check on her and see how she's doing after her mammogram and ultrasound last week, and to see if she has any questions.  She has biopsy scheduled for Wednesday afternoon. Overall she's coping well. Has been reading lots on the internet and wonders if she is going to need surgery. Advised it all depends on what the pathology shows.  She does note that in the interim, she's developed swelling of her neck and armpit. Denies any trouble breathing. Has not had a fever but has broken into sweats some. Also notes having a "fever blister" on her upper lip/lower nose.  Advised we can see her here at [facility] tomorrow for the swelling if she would like. She is agreeable and would like to be seen. Both I and her PCP Dr. Gwendlyn Deutscher have openings - scheduled her with PCP at 10:45am for ongoing continuity. Will route note to PCP as an FYI.  Patient very appreciative.  Leeanne Rio, MD

## 2015-06-14 ENCOUNTER — Encounter: Payer: Self-pay | Admitting: Family Medicine

## 2015-06-14 ENCOUNTER — Ambulatory Visit (INDEPENDENT_AMBULATORY_CARE_PROVIDER_SITE_OTHER): Payer: Medicaid Other | Admitting: Family Medicine

## 2015-06-14 ENCOUNTER — Telehealth: Payer: Self-pay | Admitting: Family Medicine

## 2015-06-14 ENCOUNTER — Other Ambulatory Visit: Payer: Self-pay | Admitting: Family Medicine

## 2015-06-14 VITALS — BP 131/73 | HR 100 | Temp 97.8°F

## 2015-06-14 DIAGNOSIS — R6884 Jaw pain: Secondary | ICD-10-CM

## 2015-06-14 DIAGNOSIS — R221 Localized swelling, mass and lump, neck: Secondary | ICD-10-CM

## 2015-06-14 DIAGNOSIS — M542 Cervicalgia: Secondary | ICD-10-CM

## 2015-06-14 DIAGNOSIS — R220 Localized swelling, mass and lump, head: Secondary | ICD-10-CM

## 2015-06-14 DIAGNOSIS — K118 Other diseases of salivary glands: Secondary | ICD-10-CM

## 2015-06-14 DIAGNOSIS — N63 Unspecified lump in unspecified breast: Secondary | ICD-10-CM

## 2015-06-14 DIAGNOSIS — F329 Major depressive disorder, single episode, unspecified: Secondary | ICD-10-CM | POA: Insufficient documentation

## 2015-06-14 DIAGNOSIS — I10 Essential (primary) hypertension: Secondary | ICD-10-CM | POA: Diagnosis not present

## 2015-06-14 DIAGNOSIS — F418 Other specified anxiety disorders: Secondary | ICD-10-CM | POA: Diagnosis not present

## 2015-06-14 DIAGNOSIS — F419 Anxiety disorder, unspecified: Secondary | ICD-10-CM

## 2015-06-14 DIAGNOSIS — N644 Mastodynia: Secondary | ICD-10-CM

## 2015-06-14 DIAGNOSIS — F32A Depression, unspecified: Secondary | ICD-10-CM | POA: Insufficient documentation

## 2015-06-14 LAB — LIPID PANEL
CHOL/HDL RATIO: 4.4 ratio (ref <=5.0)
CHOLESTEROL: 223 mg/dL — AB (ref 125–200)
HDL: 51 mg/dL (ref >=46)
LDL Cholesterol: 128 mg/dL (ref <130)
Triglycerides: 218 mg/dL — ABNORMAL HIGH (ref <150)
VLDL: 44 mg/dL — ABNORMAL HIGH (ref <30)

## 2015-06-14 MED ORDER — CLONAZEPAM 0.5 MG PO TABS: 0.5000 mg | ORAL_TABLET | Freq: Two times a day (BID) | ORAL | Status: DC | PRN

## 2015-06-14 NOTE — Assessment & Plan Note (Addendum)
Etiology unclear. Might be baseline for her, I have not seen her in more than 1 yr so uncertain about that. However due to intermittent difficulty swallowing, I will like to assess for retropharyngeal abscess or pathology although unlikely since she looks clinically fine and afebrile. Xray neck ordered. Ideally CT preferred but I will hate to expose her to unnecessary radiation especially since her symptom is not classical of retropharyngeal abscess. Will consider CT if xray is abnormal. Consider barium swallow if dysphagia becomes persistent or worsening. Patient instructed to go to the ED if symptoms persists or worsens. She verbalized understanding and agreed with plan. Tylenol as needed for pain and rest at home recommended.

## 2015-06-14 NOTE — Telephone Encounter (Signed)
I called to check back on patient. She did not go for her xray today. She stated she is feeling much better and just getting up from bed. She stated she will try and get the xray done tomorrow. I again instructed her to go to the hospital if symptoms reoccurs or worsens. She verbalized understanding.

## 2015-06-14 NOTE — Patient Instructions (Addendum)
It was nice seeing you today. I am sorry you don't feel well. Here are the things we discussed today. 1. I am uncertain the cause of your jaw swelling. I will like to assess you for abscess/pus collection at the back of your neck although it is unlikely. Take Ibuprofen or tylenol as needed for pain. Please go the hospital right away to get xray. I will call you with result. If symptoms worsens please go to the ED. 2. No swelling of your axilla observed today. 3. Please do not take more than 1 pill of your BP medication. Return this Friday for BOP check with the nurse. I will see you back in 1 wk.

## 2015-06-14 NOTE — Progress Notes (Signed)
Subjective:     Patient ID: Tracy Rose, female   DOB: 31-Mar-1965, 50 y.o.   MRN: [id number]  HPI Anxiety/Depression:Patient came in this morning panting and anxious. She denies new chest pain but endorsed left sided breast pain. She endorsed feeling dizzy, no fall, no LOC. She was recently started on HCTZ, she took two tablet yesterday and one this morning because she thought it will help with her symptoms. She has been feeling some heaviness in her head as well. She is compliant with her antidepressant, denies suicidal ideation. She has  Psychiatry follow up with  Dr. Jaynee Eagles tomorrow. Neck pain/swelling: Patient stated few days ago she noticed cold sore bump underneath her nose, she tried to cure herself by using some chemical on it, she also used her acyclovir at home. Two day ago she noticed in addition to the nasal bump she now has submandibular fullness and pain. She endorsed occasional difficulty swallowing but no throat pain, no fever. No trauma to her neck or submandibular area. Pain now is about 3/10 in severity but was worse yesterday. She uses naproxen as needed for pain. Feels like her pain and swelling is spreading to the left side of her neck, arm and axilla. Breast Mass: Patient stated her left breast pain is about the same. She is scheduled for breast biopsy tomorrow. She stated since her 2007 mammogram she decided not to get another done till she is age 43. Patient stated she has now decided to get mammogram done yearly rather than wait till age 10. She denies any new breast concern. HTN: Recently started on HCTZ. She has been taking 1-2 tablet daily. This morning she complaints of dizziness. She does not check her BP regularly.  Current Outpatient Prescriptions on File Prior to Visit  Medication Sig Dispense Refill  . hydrochlorothiazide (HYDRODIURIL) 12.5 MG tablet Take 1 tablet (12.5 mg total) by mouth daily. 30 tablet 3  . nortriptyline (PAMELOR) 25 MG capsule Take 1 capsule (25  mg total) by mouth at bedtime. 30 capsule 6  . venlafaxine XR (EFFEXOR XR) 75 MG 24 hr capsule Take 1 capsule (75 mg total) by mouth 2 (two) times daily. 60 capsule 11  . acyclovir (ZOVIRAX) 400 MG tablet Take 1 tablet (400 mg total) by mouth 2 (two) times daily. (Patient not taking: Reported on 06/14/2015) 60 tablet 2  . naproxen (NAPROSYN) 500 MG tablet Take 1 tablet (500 mg total) by mouth 2 (two) times daily. (Patient not taking: Reported on 06/14/2015) 30 tablet 0  . OVER THE COUNTER MEDICATION Take 2 tablets by mouth daily. ginko biloba    . SUMAtriptan (IMITREX) 50 MG tablet 1 tablet as needed for headache. May repeat in 2 hours x 1 dose if headache persists or recurs. (Patient not taking: Reported on 06/07/2015) 10 tablet 0   No current facility-administered medications on file prior to visit.   Past Medical History  Diagnosis Date  . Tobacco abuse 09/08/2010  . OBESITY, NOS 03/21/2006        . GOITER, MULTINODULAR 09/05/2007    Dr. Zada Girt [facility]  [place] 2011: Stable to slightly smaller bilateral thyroid nodules since 09/04/2007. No new or enlarging thyroid nodules identified. Biopsy 08/2007: non neoplastic goiter   . Genital herpes 12/12/2011  . Mild depression 12/15/2013  . Essential hypertension, benign 06/10/2015     Review of Systems  Constitutional: Negative for fatigue.  HENT: Positive for trouble swallowing. Negative for dental problem, drooling, ear discharge, hearing loss and sore  throat.        Submandibular swelling and pain  Respiratory: Negative.   Cardiovascular: Negative.   Gastrointestinal: Negative.   Musculoskeletal: Negative.        Left breast soreness  Neurological: Positive for dizziness.  Psychiatric/Behavioral: Negative for suicidal ideas. The patient is nervous/anxious.   All other systems reviewed and are negative.      Filed Vitals:   06/14/15 1022 06/14/15 1052  BP: 97/69 131/73  Pulse: 115 100  Temp: 97.8 F (36.6 C)   TempSrc:  Axillary   SpO2: 99% 99%    Objective:   Physical Exam  Constitutional: She is oriented to person, place, and time. She appears well-developed. No distress.  HENT:  Head: Normocephalic.  Right Ear: Tympanic membrane, external ear and ear canal normal.  Left Ear: Tympanic membrane, external ear and ear canal normal.  Mouth/Throat: Uvula is midline, oropharynx is clear and moist and mucous membranes are normal.  Neck: Trachea normal. Neck supple. No rigidity. No edema present. No thyromegaly present.    Cardiovascular: Normal rate, regular rhythm and normal heart sounds.   No murmur heard. Pulmonary/Chest: Effort normal and breath sounds normal. No apnea. No respiratory distress. She has no decreased breath sounds. She has no wheezes. She has no rhonchi. She has no rales. Right breast exhibits no inverted nipple, no mass, no nipple discharge, no skin change and no tenderness. Left breast exhibits no inverted nipple, no mass, no nipple discharge, no skin change and no tenderness. Breasts are symmetrical.    Abdominal: Soft. Bowel sounds are normal. She exhibits no distension and no mass. There is no tenderness.  Musculoskeletal: Normal range of motion. She exhibits no edema.  Lymphadenopathy:    She has no cervical adenopathy.    She has no axillary adenopathy.  Neurological: She is alert and oriented to person, place, and time. She has normal reflexes. No cranial nerve deficit.  Psychiatric: Her behavior is normal. Thought content normal. Her mood appears anxious. Her affect is not angry and not inappropriate. Cognition and memory are normal. She expresses impulsivity. She does not exhibit a depressed mood. She expresses no homicidal and no suicidal ideation. She expresses no suicidal plans and no homicidal plans.  Nursing note and vitals reviewed.      Assessment:     Anxiety with depression Submandibularpain and swelling Breast mass HTN     Plan:     Check problem list

## 2015-06-14 NOTE — Assessment & Plan Note (Addendum)
BP initially low but improved after a while. I advised her to take only one pill of her HCTZ daily instead of two. Continue home BP monitoring, if low to hold off on her HCTZ. Advised to follow up at the nurse clinic this Friday for BP check. FLP checked today to screen for HLD. I will see her back in 1 wk.

## 2015-06-14 NOTE — Assessment & Plan Note (Signed)
Mammogram report reviewed and discussed with patient showing mass on her left breast. She had similar result with left breast mass in same location in her 2007 mammogram for which she was advised to repeat diagnostic mammogram in 6 months then. Patient was reminded of need for follow up multiple visits from 2009 to 2012 per notes on EP[facility]. She stated she just didn't want to get it done then and she refused to follow up. In Feb 2015 she decided to opt for Biannual mammogram screening starting at age 50 yrs.   Patient now stated she has changed her mind today and will prefer to get annual mammogram done from now rather than age 16. She will follow up with biopsy tomorrow. As discussed with her, it might be benign if it is the same mass since 2007. All questions were answered.

## 2015-06-14 NOTE — Assessment & Plan Note (Signed)
A bit anxious and agitated this morning but improved after she drank water and rested. She is on Nortriptyline and Effexor prescribed by her psychiatrist ( Dr. Jaynee Eagles). She was given a short course of Klonopin prn anxiety today pending f/u with her Psych tomorrow. She is currently not suicidal or homicidal. F/U as needed.

## 2015-06-15 ENCOUNTER — Ambulatory Visit
Admission: RE | Admit: 2015-06-15 | Discharge: 2015-06-15 | Disposition: A | Discharge status: Home / Self Care | Payer: Medicaid Other | Source: Ambulatory Visit | Attending: Family Medicine | Admitting: Family Medicine

## 2015-06-15 ENCOUNTER — Encounter: Payer: Self-pay | Admitting: Neurology

## 2015-06-15 ENCOUNTER — Other Ambulatory Visit: Payer: Self-pay | Admitting: Family Medicine

## 2015-06-15 ENCOUNTER — Ambulatory Visit (INDEPENDENT_AMBULATORY_CARE_PROVIDER_SITE_OTHER): Payer: Medicaid Other | Admitting: Neurology

## 2015-06-15 VITALS — BP 152/108 | HR 96 | Resp 20 | Ht 65.5 in | Wt 262.0 lb

## 2015-06-15 DIAGNOSIS — N644 Mastodynia: Secondary | ICD-10-CM

## 2015-06-15 DIAGNOSIS — M542 Cervicalgia: Secondary | ICD-10-CM | POA: Diagnosis not present

## 2015-06-15 DIAGNOSIS — R51 Headache: Secondary | ICD-10-CM

## 2015-06-15 DIAGNOSIS — R400 Somnolence: Secondary | ICD-10-CM

## 2015-06-15 DIAGNOSIS — F909 Attention-deficit hyperactivity disorder, unspecified type: Secondary | ICD-10-CM | POA: Insufficient documentation

## 2015-06-15 DIAGNOSIS — R519 Headache, unspecified: Secondary | ICD-10-CM

## 2015-06-15 DIAGNOSIS — F902 Attention-deficit hyperactivity disorder, combined type: Secondary | ICD-10-CM | POA: Diagnosis not present

## 2015-06-15 DIAGNOSIS — R0683 Snoring: Secondary | ICD-10-CM | POA: Diagnosis not present

## 2015-06-15 DIAGNOSIS — G4710 Hypersomnia, unspecified: Secondary | ICD-10-CM | POA: Diagnosis not present
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
## 2015-06-15 NOTE — Addendum Note (Signed)
Addended by: Andrena Mews T on: 06/15/2015 10:43 AM   Modules accepted: Orders

## 2015-06-15 NOTE — Patient Instructions (Signed)
Remember to drink plenty of fluid, eat healthy meals and do not skip any meals. Try to eat protein with a every meal and eat a healthy snack such as fruit or nuts in between meals. Try to keep a regular sleep-wake schedule and try to exercise daily, particularly in the form of walking, 20-30 minutes a day, if you can.   As far as your medications are concerned, I would like to suggest: Continue current medications  As far as diagnostic testing: Sleep eval, physical therapy  I would like to see you back for sleep eval, sooner if we need to. Please call us with any interim questions, concerns, problems, updates or refill requests.   Our phone number is [phone]. We also have an after hours call service for urgent matters and there is a physician on-call for urgent questions. For any emergencies you know to call 911 or go to the nearest emergency room

## 2015-06-15 NOTE — Telephone Encounter (Signed)
Patient contacted today about her cholesterol result. Treatment option discussed. She prefer to try diet and exercise for 6 months before starting Statin therapy. Diet and exercise instruction given. We will recheck FLP in 6 months.  She also request that her neck xray be switched to breast center imaging since she is going there today for her breast biopsy.

## 2015-06-17 ENCOUNTER — Telehealth: Payer: Self-pay | Admitting: Family Medicine

## 2015-06-17 NOTE — Telephone Encounter (Signed)
Attempted to reach patient to review pathology results as I couldn't see evidence that she had otherwise been contacted - was hoping to reach her before this long weekend, but there was no answer. I left generic voicemail asking her to call back at her convenience.  Pathology did not show cancer (fibrocystic changes with calcifications). It appears she's been scheduled for a separate biopsy as the area biopsied was different than the mass seen on imaging.  I will try again to reach her next week.  Leeanne Rio, MD

## 2015-06-21 ENCOUNTER — Ambulatory Visit
Admission: RE | Admit: 2015-06-21 | Discharge: 2015-06-21 | Disposition: A | Discharge status: Home / Self Care | Payer: Medicaid Other | Source: Ambulatory Visit | Attending: Family Medicine | Admitting: Family Medicine

## 2015-06-21 ENCOUNTER — Other Ambulatory Visit: Payer: Self-pay | Admitting: Family Medicine

## 2015-06-21 DIAGNOSIS — N644 Mastodynia: Secondary | ICD-10-CM

## 2015-06-22 HISTORY — PX: BREAST BIOPSY: SHX20

## 2015-06-23 ENCOUNTER — Encounter: Payer: Self-pay | Admitting: Family Medicine

## 2015-06-23 ENCOUNTER — Ambulatory Visit (INDEPENDENT_AMBULATORY_CARE_PROVIDER_SITE_OTHER): Payer: Medicaid Other | Admitting: Family Medicine

## 2015-06-23 ENCOUNTER — Other Ambulatory Visit: Payer: Self-pay | Admitting: Family Medicine

## 2015-06-23 VITALS — BP 140/90 | HR 93 | Temp 98.0°F | Wt 263.0 lb

## 2015-06-23 DIAGNOSIS — R58 Hemorrhage, not elsewhere classified: Secondary | ICD-10-CM | POA: Diagnosis not present

## 2015-06-23 NOTE — Progress Notes (Signed)
Subjective: CC: bleeding at biopsy side HPI: Patient is a 50 y.o. female with a past medical history of a left breast lump presenting to clinic today for a same day appt due to bleeding at a biopsy site.  Patient had a left breast ultrasound on 5/30 with biopsy that has been bleeding since the procedure. The original steri-strips are in place but she's put other band-aids over it as she bled through the steri strip onto her shirt. She notes that she's been putting ice on the area and her ice bag also has blood on it. She can't quantify the amount for moe. It was initially some bright red blood and now some brown discoloration, thin discharge, not thick. No foul smell. No redness or warmth noted. She denies pain. She has not been lifting more than 10lbs. She did not go to work at [employer] yesterday due to the bleeding. No blood thinners.  Social History: current smoker  Health Maintenance: up to date  ROS: All other systems reviewed and are negative besides that noted in HPI.  Past Medical History Patient Active Problem List   Diagnosis Date Noted  . Attention deficit hyperactivity disorder (ADHD) 06/15/2015  . Anxiety and depression 06/14/2015  . Submandibular swelling 06/14/2015  . Breast mass 06/10/2015  . Chest pain 06/10/2015  . Essential hypertension, benign 06/10/2015  . Lymphadenopathy, inguinal 02/09/2015  . ADHD (attention deficit hyperactivity disorder) 08/15/2014  . Mild depression 12/15/2013  . Macromastia 04/07/2013  . Genital herpes 12/12/2011  . Tobacco abuse 09/08/2010  . GOITER, MULTINODULAR 09/05/2007  . OBESITY, NOS 03/21/2006    Medications- reviewed and updated Current Outpatient Prescriptions  Medication Sig Dispense Refill  . acyclovir (ZOVIRAX) 400 MG tablet Take 1 tablet (400 mg total) by mouth 2 (two) times daily. 60 tablet 2  . clonazePAM (KLONOPIN) 0.5 MG tablet Take 1 tablet (0.5 mg total) by mouth 2 (two) times daily as needed for anxiety. 10  tablet 0  . Cyanocobalamin (VITAMIN B-12) 5000 MCG SUBL Place under the tongue.    . hydrochlorothiazide (HYDRODIURIL) 12.5 MG tablet Take 1 tablet (12.5 mg total) by mouth daily. 30 tablet 3  . naproxen (NAPROSYN) 500 MG tablet Take 1 tablet (500 mg total) by mouth 2 (two) times daily. 30 tablet 0  . nortriptyline (PAMELOR) 25 MG capsule Take 1 capsule (25 mg total) by mouth at bedtime. 30 capsule 6  . OVER THE COUNTER MEDICATION Take 2 tablets by mouth daily. ginko biloba    . SUMAtriptan (IMITREX) 50 MG tablet 1 tablet as needed for headache. May repeat in 2 hours x 1 dose if headache persists or recurs. 10 tablet 0  . venlafaxine XR (EFFEXOR XR) 75 MG 24 hr capsule Take 1 capsule (75 mg total) by mouth 2 (two) times daily. 60 capsule 11   No current facility-administered medications for this visit.    Objective: Office vital signs reviewed. BP 157/110 mmHg  Pulse 93  Temp(Src) 98 F (36.7 C) (Oral)  Wt 263 lb (119.296 kg)  LMP 06/06/2015   Physical Examination:  General: Awake, alert, well- nourished, NAD Skin: Dried brownish red blood noted on steri-strips and overlying band-aids. Clean incision site with no active bleeding. No erythema or warmth or drainage noted.   Assessment/Plan: Bleeding from breast biopsy site: no active bleeding or signs of infection noted.  Gave reassurance. Discussed reasons to follow up in clinic. Advised to not lift more than 10 lbs 3 days following biopsy, provided work  letter stating as much.  Archie Patten PGY-2, [place]

## 2015-06-23 NOTE — Telephone Encounter (Signed)
Appears she was contacted by breast center provider Leeanne Rio, MD

## 2015-06-23 NOTE — Patient Instructions (Addendum)
Your biopsy site does not look infected, it is healing nicely. If you start to have consistent bleeding (which I do not expect at this point), apply constant pressure. You can continue to use ice to help with pain. Do not lift more than 10 pounds until 06/25/15. If you note warmth, redness, increased pain, or foul discharge from the site please follow up with [place].

## 2015-06-28 ENCOUNTER — Ambulatory Visit: Payer: Medicaid Other | Admitting: Rehabilitative and Restorative Service Providers"

## 2015-06-30 ENCOUNTER — Telehealth: Payer: Self-pay

## 2015-06-30 ENCOUNTER — Institutional Professional Consult (permissible substitution): Payer: Medicaid Other | Admitting: Neurology

## 2015-06-30 NOTE — Telephone Encounter (Signed)
Patient did not show to new consult appt.

## 2015-07-01 ENCOUNTER — Encounter: Payer: Self-pay | Admitting: Neurology

## 2015-07-01 ENCOUNTER — Ambulatory Visit: Payer: Self-pay | Admitting: Surgery

## 2015-07-01 NOTE — H&P (Signed)
History of Present Illness Tracy Rose. Tracy Hemann MD; 07/01/2015 12:07 PM) Patient words: NP breast.  The patient is a 50 year old female who presents with a breast mass. Referred by Dr. Enrique Sack for complex sclerosing lesion left breast  This is a 49 year old female who recently began experiencing bilateral breast pain left side worse than right. She had a bilateral diagnostic mammogram on 06/08/15. She had not had a mammogram in the last 10 years. The mammogram and ultrasound were unremarkable except for a indeterminate irregular mass at 1:00. This is located 9 cm from the nipple measuring 0.7 x 0.3 x 0.6 cm. Ultrasound of the axilla was normal. She underwent 2 attempts at biopsy of this area. The first attempt did not biopsy the correct lesion. However on 06/21/15 she underwent tomo guided left breast biopsy. Clip was placed. Pathology showed a complex sclerosing lesion with microcalcifications. She presents now to discuss surgical excision.  The patient feels like her breasts are always tender. She does drink a lot of caffeine every day. Her breasts are quite large and pendulous and cause a lot of back and shoulder pain. No family history of breast cancer. No previous breast surgeries.  CLINICAL DATA: 50 year old female presenting for evaluation of focal pain in the left breast.  EXAM: DIGITAL DIAGNOSTIC BILATERAL MAMMOGRAM  ULTRASOUND LEFT BREAST  COMPARISON: Previous exam(s).  ACR Breast Density Category b: There are scattered areas of fibroglandular density.  FINDINGS: A BB has been placed along the medial aspect of the left breast. No suspicious mammographic findings are seen immediately deep to this palpable marker. However, in the central slightly superior left breast in the middle depth there is an irregular mass measuring approximately 9 mm. No suspicious calcifications, masses or areas of distortion are seen in the bilateral breasts.  On physical exam, no  discrete palpable mass can be identified in the superior or upper-inner left breast, though significant tenderness is elicited with palpation.  Targeted ultrasound is performed, showing an irregular hypoechoic mass at 1 o'clock, 9 cm from the nipple measuring 0.7 x 0.3 x 0.6 cm. No blood flow was identified on color Doppler imaging. Ultrasound of the left axilla demonstrates multiple normal-appearing lymph nodes.  IMPRESSION: 1. No definite mammographic or targeted sonographic correlate for the pain in the left breast, which appears to be nonfocal.  2. There is an indeterminate irregular mass at 1 o'clock.  3. No evidence of left axillary lymphadenopathy.  4. No mammographic evidence of right breast malignancy.  RECOMMENDATION: 1. Ultrasound-guided biopsy of the left breast mass is recommended and has been scheduled for 06/15/2015 at 1 p.m.  2. No definite mammographic or targeted sonographic correlate for the left breast pain. Clinical follow-up is recommended, and any further workup should be based on clinical grounds.  I have discussed the findings and recommendations with the patient. Results were also provided in writing at the conclusion of the visit. If applicable, a reminder letter will be sent to the patient regarding the next appointment.  BI-RADS CATEGORY 4: Suspicious.   Electronically Signed By: Ammie Ferrier M.D. On: 06/08/2015 16:50   CLINICAL DATA: 9 mm sonographically occult upper central left breast mass.  EXAM: BREAST STEREOTACTIC CORE NEEDLE BIOPSY  COMPARISON: Previous exams.  FINDINGS: The patient and I discussed the procedure of stereotactic-guided biopsy including benefits and alternatives. We discussed the high likelihood of a successful procedure. We discussed the risks of the procedure including infection, bleeding, tissue injury, clip migration, and inadequate sampling. Informed written consent  was given. The usual time  out protocol was performed immediately prior to the procedure.  Using sterile technique and 1% Lidocaine as local anesthetic, under stereotactic guidance, a 9 gauge vacuum assisted device was used to perform core needle biopsy of the recently demonstrated 9 mm mass in the upper central left breast using a cephalad approach.  At the conclusion of the procedure, a coil shaped tissue marker clip was deployed into the biopsy cavity. Follow-up 2-view mammogram was performed and dictated separately.  IMPRESSION: Stereotactic-guided biopsy of a 9 mm upper central left breast mass. No apparent complications.  Electronically Signed: By: Claudie Revering M.D. On: 06/21/2015 15:36    CLINICAL DATA: Status post stereotactic guided core needle biopsy of a sonographically occult 9 mm upper central left breast mass.  EXAM: DIAGNOSTIC LEFT MAMMOGRAM POST STEREOTACTIC BIOPSY  COMPARISON: Previous exam(s).  FINDINGS: Mammographic images were obtained following stereotactic guided biopsy of the recently demonstrated 9 mm upper central left breast mass. These demonstrate post biopsy changes and a coil shaped biopsy marker clip at the location of the targeted mass.  IMPRESSION: Appropriate clip deployment following left breast stereotactic guided core needle biopsy.  Final Assessment: Post Procedure Mammograms for Marker Placement   Electronically Signed By: Claudie Revering M.D. 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 This was found to be concordant by Dr. Claudie Revering. Pathology results were discussed with the patient by telephone. The patient reported doing well after the biopsy with tenderness and minimal oozing at the site. Post biopsy instructions and care were reviewed and questions were answered. The patient was encouraged to call [facility]  for any additional concerns. Surgical consultation has been arranged with Dr. Donnie Mesa at [facility] on July 01, 2015. Pathology results reported by Terie Purser, RN on 06/22/2015. Electronically Signed By: Claudie Revering M.D. On: 06/22/2015 13:40       Other Problems (Tracy Rose, Rose; 07/01/2015 11:06 AM) High blood pressure Lump In Breast Thyroid Disease  Past Surgical History Tracy Rose, Rose; 07/01/2015 11:06 AM) Oral Surgery  Diagnostic Studies History Tracy Rose, Rose; 07/01/2015 11:06 AM) Colonoscopy never Mammogram within last year Pap Smear 1-5 years ago  Allergies Tracy Rose, Rose; 07/01/2015 11:07 AM) No Known Drug Allergies 07/01/2015  Medication History (Tracy Rose, Rose; 07/01/2015 11:08 AM) HydroCHLOROthiazide (12.5MG  Tablet, Oral) Active. Nortriptyline HCl (25MG  Capsule, Oral) Active. Venlafaxine HCl ER (75MG  Capsule ER 24HR, Oral) Active. Vitamin D (Cholecalciferol) (1000UNIT Tablet, Oral two times daily) Active. ClonazePAM (0.5MG  Tablet, Oral) Active. Naproxen Sodium (550MG  Tablet, Oral) Active. Medications Reconciled  Social History Tracy Rose, Rose; 07/01/2015 11:06 AM) Alcohol use Remotely quit alcohol use. Caffeine use Coffee, Tea. No drug use Tobacco use Former smoker.  Family History Tracy Rose, Rose; 07/01/2015 11:06 AM) Arthritis Father, Mother. Cerebrovascular Accident Father. Diabetes Mellitus Father, Mother. Heart disease in female family member before age 40 Hypertension Father. Thyroid problems Mother.  Pregnancy / Birth History Tracy Rose, Rose; 07/01/2015 11:06 AM) Age at menarche 41 years. Para 1 Regular periods     Review of Systems (Tracy Rose; 07/01/2015 11:06 AM) General Present- Fatigue and Weight Gain. Not Present- Appetite Loss, Chills, Fever, Night Sweats and Weight Loss. HEENT Present- Hoarseness, Ringing in the Ears and Sinus Pain. Not Present- Earache, Hearing Loss, Nose Bleed,  Oral Ulcers, Seasonal Allergies, Sore Throat, Visual Disturbances, Wears glasses/contact lenses and Yellow Eyes. Respiratory Present- Snoring. Not  Present- Bloody sputum, Chronic Cough, Difficulty Breathing and Wheezing. Breast Present- Breast Mass, Breast Pain and Skin Changes. Not Present- Nipple Discharge. Cardiovascular Present- Difficulty Breathing Lying Down and Rapid Heart Rate. Not Present- Chest Pain, Leg Cramps, Palpitations, Shortness of Breath and Swelling of Extremities. Gastrointestinal Present- Abdominal Pain, Bloating, Nausea and Rectal Pain. Not Present- Bloody Stool, Change in Bowel Habits, Chronic diarrhea, Constipation, Difficulty Swallowing, Excessive gas, Gets full quickly at meals, Hemorrhoids, Indigestion and Vomiting. Female Genitourinary Not Present- Frequency, Nocturia, Painful Urination, Pelvic Pain and Urgency. Musculoskeletal Present- Back Pain and Muscle Pain. Not Present- Joint Pain, Joint Stiffness, Muscle Weakness and Swelling of Extremities. Neurological Not Present- Decreased Memory, Fainting, Headaches, Numbness, Seizures, Tingling, Tremor, Trouble walking and Weakness. Psychiatric Not Present- Anxiety, Bipolar, Change in Sleep Pattern, Depression, Fearful and Frequent crying. Endocrine Present- Hot flashes. Not Present- Cold Intolerance, Excessive Hunger, Hair Changes, Heat Intolerance and New Diabetes. Hematology Present- Gland problems. Not Present- Easy Bruising, Excessive bleeding, HIV and Persistent Infections.  Vitals (Tracy Rose Rose; 07/01/2015 11:09 AM) 07/01/2015 11:08 AM Weight: 265 lb Height: 65.5in Body Surface Area: 2.24 m Body Mass Index: 43.43 kg/m  Temp.: 97.82F  Pulse: 88 (Regular)  BP: 136/94 (Sitting, Left Arm, Standard)      Physical Exam Rodman Key K. Elmyra Banwart MD; 07/01/2015 12:08 PM)  The physical exam findings are as follows: Note:WDWN in NAD HEENT: EOMI, sclera anicteric Neck: No masses, no thyromegaly Breasts: symmetric;  pendulous; no skin changes; no axilary lymphadenopathy; Right breast - no palpable masses Left breast 1:00 about 9 cm from the nipple - indistinct irregular firmness of the breast tissue; no discrete mass Lungs: CTA bilaterally; normal respiratory effort CV: Regular rate and rhythm; no murmurs Abd: +bowel sounds, soft, non-tender, no masses Ext: Well-perfused; no edema Skin: Warm, dry; no sign of jaundice    Assessment & Plan Rodman Key K. Tequila Rottmann MD; 07/01/2015 11:27 AM)  BREAST MASS, LEFT (N63)  Current Plans Schedule for Surgery - Left radioactive seed localized lumpectomy. The surgical procedure has been discussed with the patient. Potential risks, benefits, alternative treatments, and expected outcomes have been explained. All of the patient's questions at this time have been answered. The likelihood of reaching the patient's treatment goal is good. The patient understand the proposed surgical procedure and wishes to proceed  Tracy Rose. Georgette Dover, MD, [facility]  General/ Trauma Surgery  07/01/2015 12:09 PM

## 2015-07-06 ENCOUNTER — Ambulatory Visit: Payer: Medicaid Other | Attending: Family Medicine | Admitting: Rehabilitative and Restorative Service Providers"

## 2015-07-07 ENCOUNTER — Ambulatory Visit (INDEPENDENT_AMBULATORY_CARE_PROVIDER_SITE_OTHER): Payer: Medicaid Other | Admitting: Family Medicine

## 2015-07-07 DIAGNOSIS — N946 Dysmenorrhea, unspecified: Secondary | ICD-10-CM

## 2015-07-07 DIAGNOSIS — R0602 Shortness of breath: Secondary | ICD-10-CM

## 2015-07-07 NOTE — Addendum Note (Signed)
Addended by: Christen Bame D on: 07/07/2015 04:25 PM   Modules accepted: Orders

## 2015-07-07 NOTE — Progress Notes (Signed)
    Subjective:  Tracy Rose is a 50 y.o. female who presents to the [facility] today for same day appointment with a chief complaint of pelvic cramps.   HPI:  Menstrual Cramps Patient presents with 2 days menstrual cramps. Was not able to go to work yesterday because of the cramps. Additionally, had heavy bleeding yesterday. Tried taking naproxen which has helped some. Today bleeding has slowed down. Pain is much improved. Patient reports that her symptoms are tolerable today. Patient requests work excuse for today. No nausea or vomiting.   ROS: Per HPI  Objective:  Physical Exam: LMP 06/06/2015  Gen: NAD, resting comfortably CV: RRR with no murmurs appreciated Pulm: NWOB, CTAB with no crackles, wheezes, or rhonchi GI: Normal bowel sounds present. Soft, Nontender, Nondistended. MSK: no edema, cyanosis, or clubbing noted Skin: warm, dry Neuro: grossly normal, moves all extremities Psych: Normal affect and thought content   Assessment/Plan:  Menstrual Cramps Symptoms significantly better today compared to yesterday. Offered toradol injection, however patient deferred. Will continue oral naproxen as needed. Can consider hormonal contraception vs IUD placement if continues to have dysmenorrhea.   Algis Greenhouse. Jerline Pain, [place] Resident PGY-2 07/07/2015 2:58 PM

## 2015-07-07 NOTE — Patient Instructions (Signed)
Please continue the naproxen for your cramping.  If you are getting worse or not getting better, please let us know.  Take care,  Dr Jerline Pain

## 2015-07-20 ENCOUNTER — Telehealth: Payer: Self-pay

## 2015-07-20 ENCOUNTER — Institutional Professional Consult (permissible substitution): Payer: Medicaid Other | Admitting: Neurology

## 2015-07-20 NOTE — Telephone Encounter (Signed)
Patient cancelled New sleep consult appt same day due to being sick.

## 2015-07-21 ENCOUNTER — Encounter: Payer: Self-pay | Admitting: Neurology

## 2015-07-25 ENCOUNTER — Other Ambulatory Visit: Payer: Self-pay | Admitting: Surgery

## 2015-07-25 ENCOUNTER — Other Ambulatory Visit: Payer: Self-pay | Admitting: General Surgery

## 2015-07-25 DIAGNOSIS — N6320 Unspecified lump in the left breast, unspecified quadrant: Secondary | ICD-10-CM

## 2015-08-01 ENCOUNTER — Ambulatory Visit: Payer: Self-pay | Admitting: Surgery

## 2015-08-01 DIAGNOSIS — N6320 Unspecified lump in the left breast, unspecified quadrant: Secondary | ICD-10-CM

## 2015-08-05 ENCOUNTER — Inpatient Hospital Stay (HOSPITAL_COMMUNITY)
Admission: RE | Admit: 2015-08-05 | Discharge: 2015-08-09 | DRG: 885 | Disposition: A | Discharge status: Home / Self Care | Payer: Medicaid Other | Attending: Psychiatry | Admitting: Psychiatry

## 2015-08-05 ENCOUNTER — Encounter (HOSPITAL_COMMUNITY): Payer: Self-pay

## 2015-08-05 DIAGNOSIS — F312 Bipolar disorder, current episode manic severe with psychotic features: Secondary | ICD-10-CM | POA: Diagnosis present

## 2015-08-05 DIAGNOSIS — Z823 Family history of stroke: Secondary | ICD-10-CM

## 2015-08-05 DIAGNOSIS — F909 Attention-deficit hyperactivity disorder, unspecified type: Secondary | ICD-10-CM | POA: Diagnosis present

## 2015-08-05 DIAGNOSIS — I10 Essential (primary) hypertension: Secondary | ICD-10-CM | POA: Diagnosis present

## 2015-08-05 DIAGNOSIS — Z833 Family history of diabetes mellitus: Secondary | ICD-10-CM

## 2015-08-05 DIAGNOSIS — R51 Headache: Secondary | ICD-10-CM | POA: Diagnosis present

## 2015-08-05 DIAGNOSIS — Z809 Family history of malignant neoplasm, unspecified: Secondary | ICD-10-CM

## 2015-08-05 DIAGNOSIS — Z8249 Family history of ischemic heart disease and other diseases of the circulatory system: Secondary | ICD-10-CM

## 2015-08-05 DIAGNOSIS — F17210 Nicotine dependence, cigarettes, uncomplicated: Secondary | ICD-10-CM | POA: Diagnosis present

## 2015-08-05 DIAGNOSIS — Z8261 Family history of arthritis: Secondary | ICD-10-CM | POA: Diagnosis not present

## 2015-08-05 DIAGNOSIS — F1410 Cocaine abuse, uncomplicated: Secondary | ICD-10-CM | POA: Diagnosis present

## 2015-08-05 DIAGNOSIS — G4700 Insomnia, unspecified: Secondary | ICD-10-CM | POA: Diagnosis present

## 2015-08-05 DIAGNOSIS — F411 Generalized anxiety disorder: Secondary | ICD-10-CM | POA: Diagnosis not present

## 2015-08-05 DIAGNOSIS — O10019 Pre-existing essential hypertension complicating pregnancy, unspecified trimester: Secondary | ICD-10-CM | POA: Insufficient documentation

## 2015-08-05 DIAGNOSIS — F333 Major depressive disorder, recurrent, severe with psychotic symptoms: Secondary | ICD-10-CM | POA: Diagnosis not present

## 2015-08-05 HISTORY — DX: Generalized anxiety disorder: F41.1

## 2015-08-05 MED ORDER — TRAZODONE HCL 50 MG PO TABS
50.0000 mg | ORAL_TABLET | Freq: Every evening | ORAL | Status: DC | PRN
  Administered 2015-08-08: 50 mg via ORAL
  Filled 2015-08-05: qty 14
  Filled 2015-08-05: qty 1

## 2015-08-05 MED ORDER — ACETAMINOPHEN 325 MG PO TABS: 650.0000 mg | ORAL_TABLET | Freq: Four times a day (QID) | ORAL | Status: DC | PRN

## 2015-08-05 MED ORDER — ALUM & MAG HYDROXIDE-SIMETH 200-200-20 MG/5ML PO SUSP: 30.0000 mL | ORAL | Status: DC | PRN

## 2015-08-05 MED ORDER — OLANZAPINE 5 MG PO TBDP: 5.0000 mg | ORAL_TABLET | Freq: Three times a day (TID) | ORAL | Status: DC | PRN

## 2015-08-05 MED ORDER — MAGNESIUM HYDROXIDE 400 MG/5ML PO SUSP: 30.0000 mL | Freq: Every day | ORAL | Status: DC | PRN

## 2015-08-05 MED ORDER — LORAZEPAM 1 MG PO TABS
1.0000 mg | ORAL_TABLET | Freq: Four times a day (QID) | ORAL | Status: DC | PRN
  Filled 2015-08-05: qty 1
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
## 2015-08-05 NOTE — Tx Team (Signed)
Initial Interdisciplinary Treatment Plan   PATIENT STRESSORS: Marital or family conflict Substance abuse   PATIENT STRENGTHS: Capable of independent living Communication skills Supportive family/friends   PROBLEM LIST: Problem List/Patient Goals Date to be addressed Date deferred Reason deferred Estimated date of resolution  Substance Abuse 08/05/2015 08/05/2015  D/C  Anxiety 08/05/2015 08/05/2015  D/C  "Detox from Cocaine and THC" 08/05/2015 08/05/2015  D/C  "Need to get medications right" 08/05/2015 08/05/2015  D/C  "Need to get into program" 08/05/2015 08/05/2015  D/C                           DISCHARGE CRITERIA:  Ability to meet basic life and health needs Adequate post-discharge living arrangements Improved stabilization in mood, thinking, and/or behavior Medical problems require only outpatient monitoring Safe-care adequate arrangements made Verbal commitment to aftercare and medication compliance Withdrawal symptoms are absent or subacute and managed without 24-hour nursing intervention  PRELIMINARY DISCHARGE PLAN: Return to previous living arrangement  PATIENT/FAMIILY INVOLVEMENT: This treatment plan has been presented to and reviewed with the patient, Tracy Rose.  The patient and family have been given the opportunity to ask questions and make suggestions.  Harland German 08/05/2015, 6:57 PM

## 2015-08-05 NOTE — Progress Notes (Signed)
Did not attend group 

## 2015-08-06 ENCOUNTER — Encounter (HOSPITAL_COMMUNITY): Payer: Self-pay | Admitting: Registered Nurse

## 2015-08-06 DIAGNOSIS — O10019 Pre-existing essential hypertension complicating pregnancy, unspecified trimester: Secondary | ICD-10-CM | POA: Insufficient documentation

## 2015-08-06 DIAGNOSIS — G4700 Insomnia, unspecified: Secondary | ICD-10-CM | POA: Insufficient documentation

## 2015-08-06 DIAGNOSIS — F411 Generalized anxiety disorder: Secondary | ICD-10-CM

## 2015-08-06 DIAGNOSIS — F333 Major depressive disorder, recurrent, severe with psychotic symptoms: Principal | ICD-10-CM

## 2015-08-06 LAB — COMPREHENSIVE METABOLIC PANEL
ALBUMIN: 3.8 g/dL (ref 3.5–5.0)
ALK PHOS: 58 U/L (ref 38–126)
ALT: 14 U/L (ref 14–54)
AST: 14 U/L — AB (ref 15–41)
Anion gap: 6 (ref 5–15)
BUN: 13 mg/dL (ref 6–20)
CALCIUM: 8.7 mg/dL — AB (ref 8.9–10.3)
CO2: 24 mmol/L (ref 22–32)
Chloride: 110 mmol/L (ref 101–111)
Creatinine, Ser: 1.07 mg/dL — ABNORMAL HIGH (ref 0.44–1.00)
GFR calc Af Amer: >60 mL/min (ref >60)
GFR calc non Af Amer: >60 mL/min (ref >60)
GLUCOSE: 101 mg/dL — AB (ref 65–99)
Potassium: 3.8 mmol/L (ref 3.5–5.1)
SODIUM: 140 mmol/L (ref 135–145)
Total Bilirubin: 0.4 mg/dL (ref 0.3–1.2)
Total Protein: 7.1 g/dL (ref 6.5–8.1)

## 2015-08-06 LAB — CBC
HEMATOCRIT: 38.6 % (ref 36.0–46.0)
HEMOGLOBIN: 12.1 g/dL (ref 12.0–15.0)
MCH: 26.4 pg (ref 26.0–34.0)
MCHC: 31.3 g/dL (ref 30.0–36.0)
MCV: 84.1 fL (ref 78.0–100.0)
Platelets: 353 10*3/uL (ref 150–400)
RBC: 4.59 MIL/uL (ref 3.87–5.11)
RDW: 14.3 % (ref 11.5–15.5)
WBC: 6 10*3/uL (ref 4.0–10.5)

## 2015-08-06 LAB — ETHANOL: Alcohol, Ethyl (B): <5 mg/dL (ref <5)

## 2015-08-06 LAB — TSH: TSH: 1.847 u[IU]/mL (ref 0.350–4.500)

## 2015-08-06 MED ORDER — NORTRIPTYLINE HCL 25 MG PO CAPS
25.0000 mg | ORAL_CAPSULE | Freq: Every day | ORAL | Status: DC
  Filled 2015-08-06 (×2): qty 1

## 2015-08-06 MED ORDER — NAPROXEN 500 MG PO TABS
500.0000 mg | ORAL_TABLET | Freq: Two times a day (BID) | ORAL | Status: DC
  Administered 2015-08-06 – 2015-08-07 (×2): 500 mg via ORAL
  Filled 2015-08-06 (×2): qty 1
  Filled 2015-08-06 (×2): qty 14
  Filled 2015-08-06 (×6): qty 1

## 2015-08-06 MED ORDER — VENLAFAXINE HCL ER 75 MG PO CP24
75.0000 mg | ORAL_CAPSULE | Freq: Two times a day (BID) | ORAL | Status: DC
  Filled 2015-08-06 (×4): qty 1

## 2015-08-06 MED ORDER — VENLAFAXINE HCL ER 75 MG PO CP24
75.0000 mg | ORAL_CAPSULE | Freq: Two times a day (BID) | ORAL | Status: DC
  Administered 2015-08-06 – 2015-08-08 (×4): 75 mg via ORAL
  Filled 2015-08-06 (×8): qty 1

## 2015-08-06 MED ORDER — NAPROXEN 500 MG PO TABS
500.0000 mg | ORAL_TABLET | Freq: Two times a day (BID) | ORAL | Status: DC
  Filled 2015-08-06 (×4): qty 1

## 2015-08-06 MED ORDER — HYDROCHLOROTHIAZIDE 25 MG PO TABS
12.5000 mg | ORAL_TABLET | Freq: Every day | ORAL | Status: DC
  Administered 2015-08-06: 12.5 mg via ORAL
  Filled 2015-08-06 (×3): qty 0.5

## 2015-08-06 MED ORDER — HYDROCHLOROTHIAZIDE 12.5 MG PO CAPS
12.5000 mg | ORAL_CAPSULE | Freq: Every day | ORAL | Status: DC
  Administered 2015-08-07 – 2015-08-09 (×3): 12.5 mg via ORAL
  Filled 2015-08-06 (×4): qty 1
  Filled 2015-08-06: qty 14
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
## 2015-08-06 NOTE — Progress Notes (Signed)
Adult Psychoeducational Group Note  Date:  08/06/2015 Time:  9:10 PM  Group Topic/Focus:  Wrap-Up Group:   The focus of this group is to help patients review their daily goal of treatment and discuss progress on daily workbooks.  Participation Level:  Active  Participation Quality:  Appropriate  Affect:  Appropriate  Cognitive:  Appropriate  Insight: Appropriate  Engagement in Group:  Engaged  Modes of Intervention:  Discussion  Additional Comments:  The patient expressed that she resty in bed all day.The patient also said that he had a good day and rates today a 7.  Nash Shearer 08/06/2015, 9:10 PM

## 2015-08-06 NOTE — BHH Suicide Risk Assessment (Addendum)
[facility] Admission Suicide Risk Assessment   Nursing information obtained from:  Patient Demographic factors:  Divorced or widowed Current Mental Status:  NA Loss Factors:  Loss of significant relationship Historical Factors:  Impulsivity Risk Reduction Factors:  Sense of responsibility to family  Total Time spent with patient: 45 minutes Principal Problem: MDD (major depressive disorder), recurrent, severe, with psychosis ([place]) Diagnosis:   Patient Active Problem List   Diagnosis Date Noted  . MDD (major depressive disorder), recurrent, severe, with psychosis ([place]) [F33.3] 08/06/2015  . Benign essential hypertension antepartum [O10.019]   . Insomnia [G47.00]   . Anxiety state [F41.1]   . Severe manic bipolar 1 disorder with psychotic behavior ([place]) [F31.2] 08/05/2015  . Attention deficit hyperactivity disorder (ADHD) [F90.9] 06/15/2015  . Anxiety and depression [F41.8] 06/14/2015  . Submandibular swelling [R22.0, R22.1] 06/14/2015  . Breast mass [N63] 06/10/2015  . Chest pain [R07.9] 06/10/2015  . Essential hypertension, benign [I10] 06/10/2015  . Lymphadenopathy, inguinal [R59.9] 02/09/2015  . ADHD (attention deficit hyperactivity disorder) [F90.9] 08/15/2014  . Mild depression [F32.9] 12/15/2013  . Macromastia [N62] 04/07/2013  . Genital herpes [A60.00] 12/12/2011  . Tobacco abuse [Z72.0] 09/08/2010  . GOITER, MULTINODULAR [E04.2] 09/05/2007  . OBESITY, NOS [E66.9] 03/21/2006     Continued Clinical Symptoms:  Alcohol Use Disorder Identification Test Final Score (AUDIT): 0 The "Alcohol Use Disorders Identification Test", Guidelines for Use in Primary Care, Second Edition.  World Pharmacologist [facility]). Score between 0-7:  no or low risk or alcohol related problems. Score between 8-15:  moderate risk of alcohol related problems. Score between 16-19:  high risk of alcohol related problems. Score 20 or above:  warrants further diagnostic evaluation for alcohol dependence and  treatment.   CLINICAL FACTORS:  50 year old female - history of depression and anxiety. ( at this time does not endorse a history of mania)   Reports recently worsening depression, poor sleep, low energy, feeling overwhelmed, and recent vague auditory hallucinations " like hearing a fly". States she has been facing significant stressors recently, to include recently identified and biopsied breast mass ( which was reported to her as benign), sister moving in with her ( has advanced cancer, receiving chemotherapy) , mother's physical health deteriorating , daughter moving out of the house to live with another family member . Also reports medication non compliance x 2 weeks. States she has been on Effexor XR and Nortriptyline ( for insomnia) , and felt medications were helping, but had not been taking recently due to " all this other stuff occupying my mind". She endorses history of cocaine abuse, had recently relapsed after a period of almost one year of sobriety. Dx- Major Depression, Recurrent.  Plan - as patient reports she has a history of good response and tolerance to Effexor XR, will resume this medication at 75 mgrs QDAY . We discussed potential side effects and risks associated with tricyclic, and agrees to try another medication for insomnia, such as trazodone , at this time.     Musculoskeletal: Strength & Muscle Tone: within normal limits Gait & Station: normal Patient leans: N/A  Psychiatric Specialty Exam: Physical Exam  ROSno headache , no chest pain, no shortness of breath at room air   Blood pressure 109/73, pulse 85, temperature 98.4 F (36.9 C), temperature source Oral, resp. rate 20, height 5\' 6"  (1.676 m), weight 261 lb (118.389 kg), last menstrual period 08/05/2015, SpO2 100 %.Body mass index is 42.15 kg/(m^2).  General Appearance: Fairly Groomed  Eye Contact:  Good  Speech:  Normal Rate  Volume:  Normal  Mood:  depressed   Affect:  mildly constricted but reactive    Thought Process:  Linear  Orientation:  Full (Time, Place, and Person)  Thought Content:  denies hallucinations at this time, and does not appear internally preoccupied , no delusions expressed   Suicidal Thoughts:  No- currently denies suicidal ideations, and denies self injurious ideations, able to contract for safety on unit at this time   Homicidal Thoughts:  No- denies homicidal or violent ideations   Memory:  recent and remote grossly intact   Judgement:  Fair  Insight:  Present  Psychomotor Activity:  Normal  Concentration:  Concentration: Good and Attention Span: Good  Recall:  Good  Fund of Knowledge:  Good  Language:  Good  Akathisia:  Negative  Handed:  Right  AIMS (if indicated):     Assets:  Desire for Improvement Resilience  ADL's:  Intact  Cognition:  WNL  Sleep:  Number of Hours: 6.25      COGNITIVE FEATURES THAT CONTRIBUTE TO RISK:  Closed-mindedness and Loss of executive function    SUICIDE RISK:   Moderate:  Frequent suicidal ideation with limited intensity, and duration, some specificity in terms of plans, no associated intent, good self-control, limited dysphoria/symptomatology, some risk factors present, and identifiable protective factors, including available and accessible social support.  PLAN OF CARE: Patient will be admitted to inpatient psychiatric unit for stabilization and safety. Will provide and encourage milieu participation. Provide medication management and maked adjustments as needed.  Will follow daily.    I certify that inpatient services furnished can reasonably be expected to improve the patient's condition.   Neita Garnet, MD 08/06/2015, 12:41 PM

## 2015-08-06 NOTE — Progress Notes (Signed)
Pt stated she does not believe her current medication regimen is working. Pt said she did really well on Zoloft and Wellbutrin in the past. Pt really wants to get back on medications. Pt stated she was off the meds due to forgetting to take them because of the stress for the past 2 weeks.

## 2015-08-06 NOTE — Progress Notes (Signed)
Patient ID: Tracy Rose, female   DOB: [birth year], 50 y.o.   MRN: [id number]  D: Pt has been very isolative on the unit today. Pt did not go to any groups nor did she engage in any treatment. Pt slept most of the day, she reported that she just wanted to be left alone to sleep. Pt did not eat, she reported that she just wanted to sleep. Pt reported that her depression was a 4, her hopelessness was a 1, and her anxiety was a 4. Pt reported that her goal for today was to get medication for the right problems. Pt reported being negative SI/HI, no AH/VH noted. A: 15 min checks continued for patient safety. R: Pt safety maintained.

## 2015-08-06 NOTE — BHH Group Notes (Signed)
[facility] LCSW Group Therapy Note  08/06/2015 at 11:10 AM  Type of Therapy and Topic: Group Therapy: Avoiding Self-Sabotaging and Enabling Behaviors  Participation Level: Did Not Attend Despite CSW going to p[t's room and encouraging her to attend group   Sheilah Pigeon, LCSW

## 2015-08-07 NOTE — BHH Group Notes (Signed)
[facility] LCSW Group Therapy  08/07/2015 1:15 PM  Type of Therapy:  Group Therapy  Participation Level:  Active  Participation Quality:  Appropriate  Affect:  Appropriate  Cognitive:  Alert and Oriented  Insight:  Developing/Improving  Engagement in Therapy:  Developing/Improving  Modes of Intervention:  Activity, Discussion, Exploration, Rapport Building, Socialization and Support  Summary of Progress/Problems: Topic for today was thoughts and feelings regarding discharge. We discussed fears of upcoming changes including judegements, expectations and stigma of mental health issues. We then discussed supports: what constitutes a supportive framework. As patients processed their anxiety about discharge and described healthy supports patient  Shared the importance of integrity for her. Patient chose no visual to represent decompensation; yet choose two to represent improvement. She chose a photo of a happy smiling family and also something to represent organization.   Sheilah Pigeon, LCSW

## 2015-08-07 NOTE — Progress Notes (Signed)
Patient ID: Tracy Rose, female   DOB: November 14, 1965, 50 y.o.   MRN: [id number]   D: Pt was appropriate on the unit today, she reported that she felt much better today. Pt was seen by [facility] NP, orders were noted to move patient to the 300 hall. Pt was moved to the 300 hall, no issues or concerns noted. Pt reported that her depression was a 0, her hopelessness was a 0, and her anxiety was a 0. Pt reported that her goal for today was to continue to do better. Pt reported being negative SI/HI, no AH/VH noted. A: 15 min checks continued for patient safety. R: Pt safety maintained.

## 2015-08-07 NOTE — Progress Notes (Signed)
[facility] MD Progress Note  08/07/2015 4:28 PM Tracy Rose  MRN:  [id number]   Subjective:  "I feel a little dizzy" Patient seen by this provider and chart reviewed 08/07/2015.  On evaluation:  Tracy Rose reports that she feels better and denies suicidal/homicidal ideation.  States that she slept well last night and is tolerating medications without adverse reaction.  States that she is not hearing any voices at this time.  States that she is also eating without difficulty.    Principal Problem: MDD (major depressive disorder), recurrent, severe, with psychosis ([place]) Diagnosis:   Patient Active Problem List   Diagnosis Date Noted  . MDD (major depressive disorder), recurrent, severe, with psychosis ([place]) [F33.3] 08/06/2015  . Benign essential hypertension antepartum [O10.019]   . Insomnia [G47.00]   . Anxiety state [F41.1]   . Severe manic bipolar 1 disorder with psychotic behavior ([place]) [F31.2] 08/05/2015  . Attention deficit hyperactivity disorder (ADHD) [F90.9] 06/15/2015  . Anxiety and depression [F41.8] 06/14/2015  . Submandibular swelling [R22.0, R22.1] 06/14/2015  . Breast mass [N63] 06/10/2015  . Chest pain [R07.9] 06/10/2015  . Essential hypertension, benign [I10] 06/10/2015  . Lymphadenopathy, inguinal [R59.9] 02/09/2015  . ADHD (attention deficit hyperactivity disorder) [F90.9] 08/15/2014  . Mild depression [F32.9] 12/15/2013  . Macromastia [N62] 04/07/2013  . Genital herpes [A60.00] 12/12/2011  . Tobacco abuse [Z72.0] 09/08/2010  . GOITER, MULTINODULAR [E04.2] 09/05/2007  . OBESITY, NOS [E66.9] 03/21/2006   Total Time spent with patient: 25 minutes  Past Psychiatric History: Pt has no hx of self harm or suicide attempts. She reports one psychiatric admission at [facility] in 2010 for bipolar disorder  Past Medical History:  Past Medical History  Diagnosis Date  . Tobacco abuse 09/08/2010  . OBESITY, NOS 03/21/2006        . GOITER, MULTINODULAR 09/05/2007    Dr.  [facility]  [place] 2011: Stable to slightly smaller bilateral thyroid nodules since 09/04/2007. No new or enlarging thyroid nodules identified. Biopsy 08/2007: non neoplastic goiter   . Genital herpes 12/12/2011  . Mild depression 12/15/2013  . Essential hypertension, benign 06/10/2015  . Anxiety state     Past Surgical History  Procedure Laterality Date  . Wisdom tooth extraction      @ age 3  . Radiology with anesthesia N/A 01/26/2014    Procedure: MRA NECK, MRI BRAIN WITH AND WITHOUT CONTRAST ;  Surgeon: Medication Radiologist, MD;  Location: [place];  Service: Radiology;  Laterality: N/A;   Family History:  Family History  Problem Relation Age of Onset  . Arthritis Mother   . Diabetes Father   . Arthritis Father   . Hypertension Father   . Hyperlipidemia Father   . Heart disease Father   . Stroke Father   . Cancer Maternal Grandfather   . Migraines Neg Hx   . Cancer Sister    Family Psychiatric  History: D[place] Social History:  History  Alcohol Use No     History  Drug Use No    Social History   Social History  . Marital Status: Divorced    Spouse Name: N/A  . Number of Children: 1  . Years of Education: Assoc.   Occupational History  . [employer]    Social History Main Topics  . Smoking status: Current Every Day Smoker -- 0.20 packs/day for 25 years    Types: Cigarettes  . Smokeless tobacco: Former Systems developer    Quit date: 10/28/2010  . Alcohol Use: No  .  Drug Use: No  . Sexual Activity: Not Currently   Other Topics Concern  . None   Social History Narrative   Unemployed.  Associates Degree.  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   Caffeine Use: none   Additional Social History:    Pain Medications: pt denies abuse - see pta meds list Prescriptions: pt denies abuse - see pta meds list Over the Counter: pt denies abuse - see pta meds list History of alcohol / drug use?: Yes Longest period of sobriety  (when/how long): 2 yrs 9 mos Negative Consequences of Use: Personal relationships, Financial Name of Substance 1: crack cocaine 1 - Age of First Use: 20 1 - Amount (size/oz): pt sts has spent $1000 on crack in past 10 days 1 - Frequency: had been clean for  9 mos until relapse 10 days ago 1 - Last Use / Amount: 08/05/15 - 1 gram Name of Substance 2: marijuana 2 - Age of First Use: 20 2 - Frequency: had been clean for 9 mos until relapse 10 days ago (has used x 3 in past 10 days) 2 - Last Use / Amount: 08/04/15  Sleep: Good  Appetite:  Good  Current Medications: Current Facility-Administered Medications  Medication Dose Route Frequency Provider Last Rate Last Dose  . acetaminophen (TYLENOL) tablet 650 mg  650 mg Oral Q6H PRN Niel Hummer, NP      . alum & mag hydroxide-simeth (MAALOX/MYLANTA) 200-200-20 MG/5ML suspension 30 mL  30 mL Oral Q4H PRN Niel Hummer, NP      . hydrochlorothiazide (MICROZIDE) capsule 12.5 mg  12.5 mg Oral Daily Jenne Campus, MD   12.5 mg at 08/07/15 0734  . LORazepam (ATIVAN) tablet 1 mg  1 mg Oral Q6H PRN Niel Hummer, NP      . magnesium hydroxide (MILK OF MAGNESIA) suspension 30 mL  30 mL Oral Daily PRN Niel Hummer, NP      . naproxen (NAPROSYN) tablet 500 mg  500 mg Oral BID Shuvon B Rankin, NP   500 mg at 08/07/15 0734  . traZODone (DESYREL) tablet 50 mg  50 mg Oral QHS PRN Niel Hummer, NP      . venlafaxine XR (EFFEXOR-XR) 24 hr capsule 75 mg  75 mg Oral BID Shuvon B Rankin, NP   75 mg at 08/07/15 1275    Lab Results:  Results for orders placed or performed during the hospital encounter of 08/05/15 (from the past 48 hour(s))  TSH     Status: None   Collection Time: 08/06/15  6:23 AM  Result Value Ref Range   TSH 1.847 0.350 - 4.500 uIU/mL    Comment: Performed at [facility]  CBC     Status: None   Collection Time: 08/06/15  6:23 AM  Result Value Ref Range   WBC 6.0 4.0 - 10.5 K/uL   RBC 4.59 3.87 - 5.11 MIL/uL    Hemoglobin 12.1 12.0 - 15.0 g/dL   HCT 38.6 36.0 - 46.0 %   MCV 84.1 78.0 - 100.0 fL   MCH 26.4 26.0 - 34.0 pg   MCHC 31.3 30.0 - 36.0 g/dL   RDW 14.3 11.5 - 15.5 %   Platelets 353 150 - 400 K/uL    Comment: Performed at [facility]  Comprehensive metabolic panel     Status: Abnormal   Collection Time: 08/06/15  6:23 AM  Result Value  Ref Range   Sodium 140 135 - 145 mmol/L   Potassium 3.8 3.5 - 5.1 mmol/L   Chloride 110 101 - 111 mmol/L   CO2 24 22 - 32 mmol/L   Glucose, Bld 101 (H) 65 - 99 mg/dL   BUN 13 6 - 20 mg/dL   Creatinine, Ser 1.07 (H) 0.44 - 1.00 mg/dL   Calcium 8.7 (L) 8.9 - 10.3 mg/dL   Total Protein 7.1 6.5 - 8.1 g/dL   Albumin 3.8 3.5 - 5.0 g/dL   AST 14 (L) 15 - 41 U/L   ALT 14 14 - 54 U/L   Alkaline Phosphatase 58 38 - 126 U/L   Total Bilirubin 0.4 0.3 - 1.2 mg/dL   GFR calc non Af Amer >60 >60 mL/min   GFR calc Af Amer >60 >60 mL/min    Comment: (NOTE) The eGFR has been calculated using the CKD EPI equation. This calculation has not been validated in all clinical situations. eGFR's persistently <60 mL/min signify possible Chronic Kidney Disease.    Anion gap 6 5 - 15    Comment: Performed at [facility]  Ethanol     Status: None   Collection Time: 08/06/15  6:23 AM  Result Value Ref Range   Alcohol, Ethyl (B) <5 <5 mg/dL    Comment:        LOWEST DETECTABLE LIMIT FOR SERUM ALCOHOL IS 5 mg/dL FOR MEDICAL PURPOSES ONLY Performed at [facility]     Blood Alcohol level:  Lab Results  Component Value Date   [facility] <5 09/47/0962    Metabolic Disorder Labs: Lab Results  Component Value Date   HGBA1C 5.8 03/12/2013   No results found for: PROLACTIN Lab Results  Component Value Date   CHOL 223* 06/14/2015   TRIG 218* 06/14/2015   HDL 51 06/14/2015   CHOLHDL 4.4 06/14/2015   VLDL 44* 06/14/2015   LDLCALC 128 06/14/2015   LDLCALC 120* 03/12/2013    Physical Findings: AIMS: Facial and  Oral Movements Muscles of Facial Expression: None, normal Lips and Perioral Area: None, normal Jaw: None, normal Tongue: None, normal,Extremity Movements Upper (arms, wrists, hands, fingers): None, normal Lower (legs, knees, ankles, toes): None, normal, Trunk Movements Neck, shoulders, hips: None, normal, Overall Severity Severity of abnormal movements (highest score from questions above): None, normal Incapacitation due to abnormal movements: None, normal Patient's awareness of abnormal movements (rate only patient's report): No Awareness, Dental Status Current problems with teeth and/or dentures?: No Does patient usually wear dentures?: No  CIWA:    COWS:     Musculoskeletal: Strength & Muscle Tone: within normal limits Gait & Station: normal Patient leans: N/A  Psychiatric Specialty Exam: Physical Exam  Constitutional: She is oriented to person, place, and time.  Neck: Normal range of motion.  Respiratory: Effort normal.  Musculoskeletal: Normal range of motion.  Neurological: She is alert and oriented to person, place, and time. Coordination and gait normal.  Skin: Skin is warm and dry.  Psychiatric: Her speech is normal. Her mood appears anxious. Thought content is paranoid. Cognition and memory are normal. She expresses impulsivity. She exhibits a depressed mood.    ROS  Blood pressure 163/101, pulse 88, temperature 98.6 F (37 C), temperature source Oral, resp. rate 20, height 5' 6" (1.676 m), weight 118.389 kg (261 lb), last menstrual period 08/05/2015, SpO2 100 %.Body mass index is 42.15 kg/(m^2).  General Appearance: Casual  Eye Contact:  Good  Speech:  Clear and Coherent  and Normal Rate  Volume:  Normal  Mood:  Anxious and Depressed  Affect:  Congruent  Thought Process:  Goal Directed  Orientation:  Full (Time, Place, and Person)  Thought Content:  Rumination  Suicidal Thoughts:  Denies at this time  Homicidal Thoughts:  No  Memory:  Immediate;   Good Recent;    Good Remote;   Good  Judgement:  Fair  Insight:  Lacking  Psychomotor Activity:  Normal  Concentration:  Concentration: Fair and Attention Span: Fair  Recall:  [employer] of Knowledge:  Fair  Language:  Good  Akathisia:  No  Handed:  Right  AIMS (if indicated):     Assets:  Communication Skills Desire for Improvement  ADL's:  Intact  Cognition:  WNL  Sleep:  Number of Hours: 6.5     Treatment Plan Summary: Daily contact with patient to assess and evaluate symptoms and progress in treatment and Medication management   Plan: Continue Q 15 minute checks for safety Encourage group milieu Medications: Will restart home mediations. (Patient states that they were working when she was taking them.) Major Depressive Disorder: Effexor XR 75 mg daily; Nortriptyline 25 mg daily Hypertension: Hydrochlorothiazide 12.5 mg daily Anxiety: Started Ativan 1 mg Q 6 hr prn Insomnia: Started Trazodone 50 mg Q hs prn Pain/Headache: Restarted Naprosyn 500 mg Bid Social worker to continue working with patient on discharge planning and follow  up  Rankin, Shuvon, NP 08/07/2015, 4:28 PM Agree with NP progress note as above
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
## 2015-08-07 NOTE — Progress Notes (Signed)
D: Patient noted to interact well with peers in the milieu and did participate in evening wrap up group session. Pt denies SI/HI or plans to harm herself. A: Q 15 minute safety checks, encourage staff/peer interaction, administer medications as ordered. R: No s/s of distress noted.

## 2015-08-07 NOTE — Progress Notes (Signed)
Patient did not attend the evening speaker AA meeting. Pt was notified that group was beginning but remained in bed.   

## 2015-08-08 MED ORDER — SERTRALINE HCL 50 MG PO TABS
50.0000 mg | ORAL_TABLET | Freq: Every day | ORAL | Status: DC
  Administered 2015-08-08: 50 mg via ORAL
  Filled 2015-08-08: qty 1
  Filled 2015-08-08: qty 14
  Filled 2015-08-08 (×2): qty 1

## 2015-08-08 MED ORDER — BUPROPION HCL ER (XL) 150 MG PO TB24
150.0000 mg | ORAL_TABLET | Freq: Every day | ORAL | Status: DC
  Administered 2015-08-09: 150 mg via ORAL
  Filled 2015-08-08: qty 14
  Filled 2015-08-08 (×2): qty 1

## 2015-08-08 NOTE — Progress Notes (Signed)
D:  Patient's self inventory sheet, patient sleeps good, no sleep medication given.  Good appetite, low energy level, good concentration.  Rated depression and hopeless 1, denied anxiety.  Denied withdrawals.  Denied SI.  Denied pain.  Goal is to discharge or find another facility.  Plans to let staff know problems.  "Thank you for your help."  No discharge plans. A:  Medications administered per MD orders.  Emotional support and encouragement given patient. R:  Denied SI and HI.  Denied A/V hallucinations.  Contracts for safety.  Safety maintained with 15 minute checks.

## 2015-08-08 NOTE — BHH Group Notes (Signed)
[facility] LCSW Group Therapy  08/08/2015 4:13 PM  Type of Therapy:  Group Therapy  Participation Level:  Active  Participation Quality:  Attentive  Affect:  Appropriate  Cognitive:  Alert and Oriented  Insight:  Engaged  Engagement in Therapy:  Improving  Modes of Intervention:  Confrontation, Discussion, Education, Exploration, Problem-solving, Rapport Building, Socialization and Support  Summary of Progress/Problems: Today's Topic: Overcoming Obstacles. Patients identified one short term goal and potential obstacles in reaching this goal. Patients processed barriers involved in overcoming these obstacles. Patients identified steps necessary for overcoming these obstacles and explored motivation (internal and external) for facing these difficulties head on. Tracy Rose was attentive and engaged during today's processing group. Tracy Rose shared that she is hoping to get into [facility] or [facility] but is equally concerned about missing work. She was able to process her conflicting ideas for treatment (inpatient versus outpatient) and stated that ultimately, she wanted to get put on "medication that will help my bipolar disorder and figure out a plan to stop using." She was open to referrals for inpatient and was able to problem solve with CSW and the group.   Smart, Demetrius Barrell LCSW 08/08/2015, 4:13 PM
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
## 2015-08-08 NOTE — Plan of Care (Signed)
Problem: Education: Goal: Ability to state activities that reduce stress will improve Outcome: Progressing Nurse discussed anxiety/coping skills with patient.

## 2015-08-08 NOTE — Progress Notes (Signed)
Recreation Therapy Notes  Date: 07.18.2017 Time: 9:30am Location: 300 Hall Group Room   Group Topic: Stress Management  Goal Area(s) Addresses:  Patient will actively participate in stress management techniques presented during session.   Behavioral Response: Did not attend.   Thom Ollinger L Breleigh Carpino, LRT/CTRS        Jae Bruck L 08/08/2015 1:20 PM

## 2015-08-08 NOTE — Progress Notes (Signed)
[facility] MD Progress Note  08/08/2015 2:39 PM Tracy Rose  MRN:  [id number] Subjective:  Patient reports some improvement compared to admission . Denies medication side effects but states that she has a history of ADHD and does not feel current medication regimen adequately addresses it . Objective : I have discussed case with treatment team and have met with patient. Patient presents partially improved compared to admission, but still ruminative and focused on stressors, relating to her sister and mother being medically ill and sister needing treatment for advanced malignancy . Patient reports, in addition to depression, history of ADHD. We discussed medication issues- states that in the past she did " really well on Wellbutrin and Zoloft "  Denies side effects and it appears she stopped this regimen as she was feeling better, not due  To side effects  Improving group attendance, no disruptive or agitated behaviors on unit, noted to spend time playing the unit's piano.  Principal Problem: MDD (major depressive disorder), recurrent, severe, with psychosis ([place]) Diagnosis:   Patient Active Problem List   Diagnosis Date Noted  . MDD (major depressive disorder), recurrent, severe, with psychosis ([place]) [F33.3] 08/06/2015  . Benign essential hypertension antepartum [O10.019]   . Insomnia [G47.00]   . Anxiety state [F41.1]   . Severe manic bipolar 1 disorder with psychotic behavior ([place]) [F31.2] 08/05/2015  . Attention deficit hyperactivity disorder (ADHD) [F90.9] 06/15/2015  . Anxiety and depression [F41.8] 06/14/2015  . Submandibular swelling [R22.0, R22.1] 06/14/2015  . Breast mass [N63] 06/10/2015  . Chest pain [R07.9] 06/10/2015  . Essential hypertension, benign [I10] 06/10/2015  . Lymphadenopathy, inguinal [R59.9] 02/09/2015  . ADHD (attention deficit hyperactivity disorder) [F90.9] 08/15/2014  . Mild depression [F32.9] 12/15/2013  . Macromastia [N62] 04/07/2013  . Genital herpes  [A60.00] 12/12/2011  . Tobacco abuse [Z72.0] 09/08/2010  . GOITER, MULTINODULAR [E04.2] 09/05/2007  . OBESITY, NOS [E66.9] 03/21/2006   Total Time spent with patient: 20 minutes    Past Medical History:  Past Medical History  Diagnosis Date  . Tobacco abuse 09/08/2010  . OBESITY, NOS 03/21/2006        . GOITER, MULTINODULAR 09/05/2007    Dr. Zada [facility]  [place] 2011: Stable to slightly smaller bilateral thyroid nodules since 09/04/2007. No new or enlarging thyroid nodules identified. Biopsy 08/2007: non neoplastic goiter   . Genital herpes 12/12/2011  . Mild depression 12/15/2013  . Essential hypertension, benign 06/10/2015  . Anxiety state     Past Surgical History  Procedure Laterality Date  . Wisdom tooth extraction      @ age 89  . Radiology with anesthesia N/A 01/26/2014    Procedure: MRA NECK, MRI BRAIN WITH AND WITHOUT CONTRAST ;  Surgeon: Medication Radiologist, MD;  Location: [place];  Service: Radiology;  Laterality: N/A;   Family History:  Family History  Problem Relation Age of Onset  . Arthritis Mother   . Diabetes Father   . Arthritis Father   . Hypertension Father   . Hyperlipidemia Father   . Heart disease Father   . Stroke Father   . Cancer Maternal Grandfather   . Migraines Neg Hx   . Cancer Sister    Social History:  History  Alcohol Use No     History  Drug Use No    Social History   Social History  . Marital Status: Divorced    Spouse Name: N/A  . Number of Children: 1  . Years of Education: Assoc.   Occupational History  .  [employer]    Social History Main Topics  . Smoking status: Current Every Day Smoker -- 0.20 packs/day for 25 years    Types: Cigarettes  . Smokeless tobacco: Former Systems developer    Quit date: 10/28/2010  . Alcohol Use: No  . Drug Use: No  . Sexual Activity: Not Currently   Other Topics Concern  . None   Social History Narrative   Unemployed.  Associates Degree.  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   Caffeine Use: none   Additional Social History:    Pain Medications: pt denies abuse - see pta meds list Prescriptions: pt denies abuse - see pta meds list Over the Counter: pt denies abuse - see pta meds list History of alcohol / drug use?: Yes Longest period of sobriety (when/how long): 2 yrs 9 mos Negative Consequences of Use: Personal relationships, Financial Name of Substance 1: crack cocaine 1 - Age of First Use: 20 1 - Amount (size/oz): pt sts has spent $1000 on crack in past 10 days 1 - Frequency: had been clean for  9 mos until relapse 10 days ago 1 - Last Use / Amount: 08/05/15 - 1 gram Name of Substance 2: marijuana 2 - Age of First Use: 20 2 - Frequency: had been clean for 9 mos until relapse 10 days ago (has used x 3 in past 10 days) 2 - Last Use / Amount: 08/04/15  Sleep: improving  Appetite:  good   Current Medications: Current Facility-Administered Medications  Medication Dose Route Frequency Provider Last Rate Last Dose  . acetaminophen (TYLENOL) tablet 650 mg  650 mg Oral Q6H PRN Niel Hummer, NP      . alum & mag hydroxide-simeth (MAALOX/MYLANTA) 200-200-20 MG/5ML suspension 30 mL  30 mL Oral Q4H PRN Niel Hummer, NP      . hydrochlorothiazide (MICROZIDE) capsule 12.5 mg  12.5 mg Oral Daily Myer Peer Cobos, MD   12.5 mg at 08/08/15 0845  . LORazepam (ATIVAN) tablet 1 mg  1 mg Oral Q6H PRN Niel Hummer, NP      . magnesium hydroxide (MILK OF MAGNESIA) suspension 30 mL  30 mL Oral Daily PRN Niel Hummer, NP      . naproxen (NAPROSYN) tablet 500 mg  500 mg Oral BID Shuvon B Rankin, NP   500 mg at 08/07/15 0734  . traZODone (DESYREL) tablet 50 mg  50 mg Oral QHS PRN Niel Hummer, NP      . venlafaxine XR (EFFEXOR-XR) 24 hr capsule 75 mg  75 mg Oral BID Shuvon B Rankin, NP   75 mg at 08/08/15 0846    Lab Results: No results found for this or any previous visit (from the past 48 hour(s)).  Blood Alcohol level:  Lab Results   Component Value Date   ETH <5 23/55/7322    Metabolic Disorder Labs: Lab Results  Component Value Date   HGBA1C 5.8 03/12/2013   No results found for: PROLACTIN Lab Results  Component Value Date   CHOL 223* 06/14/2015   TRIG 218* 06/14/2015   HDL 51 06/14/2015   CHOLHDL 4.4 06/14/2015   VLDL 44* 06/14/2015   LDLCALC 128 06/14/2015   LDLCALC 120* 03/12/2013    Physical Findings: AIMS: Facial and Oral Movements Muscles of Facial Expression: None, normal Lips and Perioral Area: None, normal Jaw: None, normal Tongue: None, normal,Extremity Movements Upper (arms, wrists, hands, fingers): None, normal Lower (legs, knees,  ankles, toes): None, normal, Trunk Movements Neck, shoulders, hips: None, normal, Overall Severity Severity of abnormal movements (highest score from questions above): None, normal Incapacitation due to abnormal movements: None, normal Patient's awareness of abnormal movements (rate only patient's report): No Awareness, Dental Status Current problems with teeth and/or dentures?: No Does patient usually wear dentures?: No  CIWA:    COWS:     Musculoskeletal: Strength & Muscle Tone: within normal limits Gait & Station: normal Patient leans: N/A  Psychiatric Specialty Exam: Physical Exam  ROS no headache, no chest pain , no shortness of breath   Blood pressure 126/76, pulse 86, temperature 98.1 F (36.7 C), temperature source Oral, resp. rate 20, height _0  (1.676 m), weight 261 lb (118.389 kg), last menstrual period 08/05/2015, SpO2 100 %.Body mass index is 42.15 kg/(m^2).  General Appearance: Fairly Groomed  Eye Contact:  Good  Speech:  Normal Rate  Volume:  Normal  Mood:  depressed, but improving   Affect:  constricted, does smile at times appropriately   Thought Process:  Linear  Orientation:  Full (Time, Place, and Person)  Thought Content:  at this time denies hallucinations, and does not appear internally preoccupied, no delusions expressed    Suicidal Thoughts:  No today denies suicidal ideations, denies self injurious ideations   Homicidal Thoughts:  No denies violent or homicidal ideations   Memory:  recent and remote grossly intact   Judgement:  Fair  Insight:  Fair  Psychomotor Activity:  Normal  Concentration:  Concentration: Good and Attention Span: Good  Recall:  Good  Fund of Knowledge:  Good  Language:  Good  Akathisia:  Negative  Handed:  Right  AIMS (if indicated):     Assets:  Desire for Improvement Resilience  ADL's:  Intact  Cognition:  WNL  Sleep:  Number of Hours: 5   Assessment - patient reports ongoing depression. Affect partially improved compared to admission . At this time no active SI or psychotic symptoms reported . Reports history of ADHD, and reports history of good response to Wellbutrin XL, Zoloft combination in the past, without side effects. It is felt Wellbutrin would better address distractibility , inattention symptoms. Side effects reviewed . Treatment Plan Summary: Daily contact with patient to assess and evaluate symptoms and progress in treatment, Medication management, Plan inpatient admission  and medications as below  Encourage group, milieu participation to work on coping skills and symptom reduction  D/C Effexor XR Start Wellbutrin XL 150 mgrs QDAY for depression and ADHD symptoms Start Zoloft 50 mgrs QDAY for depression  Continue Trazodone 50 mgrs QHS PRN for insomnia as needed  Continue Ativan 1 mgr Q 6 hours PRN for severe anxiety if needed  Neita Garnet, MD 08/08/2015, 2:39 PM

## 2015-08-08 NOTE — BHH Suicide Risk Assessment (Signed)
[facility] INPATIENT:  Family/Significant Other Suicide Prevention Education  Suicide Prevention Education:  Patient Refusal for Family/Significant Other Suicide Prevention Education: The patient Tracy Rose has refused to provide written consent for family/significant other to be provided Family/Significant Other Suicide Prevention Education during admission and/or prior to discharge.  Physician notified.  SPE completed with pt, as pt refused to consent to family contact. SPI pamphlet provided to pt and pt was encouraged to share information with support network, ask questions, and talk about any concerns relating to SPE. Pt denies access to guns/firearms and verbalized understanding of information provided. Mobile Crisis information also provided to pt.   Smart, Khamarion Bjelland LCSW 08/08/2015, 11:27 AM

## 2015-08-08 NOTE — Progress Notes (Signed)
Patient was observed lying in bed resting. She did not attend AA meeting reports that she once came here and led groups on the unit. She is worried about discharging on tomorrow so that she can go to work. Writer encouraged her to take time out to get herself together and she can get a note for work if needed. She has been in her room resting this evening and requested no medications. Safety maintained on unit with 15 min checks.

## 2015-08-09 LAB — RAPID URINE DRUG SCREEN, HOSP PERFORMED
AMPHETAMINES: NOT DETECTED
Barbiturates: NOT DETECTED
Benzodiazepines: NOT DETECTED
COCAINE: POSITIVE — AB
OPIATES: NOT DETECTED
Tetrahydrocannabinol: NOT DETECTED

## 2015-08-09 LAB — PREGNANCY, URINE: Preg Test, Ur: NEGATIVE

## 2015-08-09 MED ORDER — NAPROXEN 500 MG PO TABS: 500.0000 mg | ORAL_TABLET | Freq: Two times a day (BID) | ORAL | Status: DC

## 2015-08-09 MED ORDER — TRAZODONE HCL 50 MG PO TABS: 50.0000 mg | ORAL_TABLET | Freq: Every evening | ORAL | Status: DC | PRN

## 2015-08-09 MED ORDER — SERTRALINE HCL 50 MG PO TABS: 50.0000 mg | ORAL_TABLET | Freq: Every day | ORAL | Status: DC

## 2015-08-09 MED ORDER — BUPROPION HCL ER (XL) 150 MG PO TB24: 150.0000 mg | ORAL_TABLET | Freq: Every day | ORAL | Status: DC

## 2015-08-09 MED ORDER — HYDROCHLOROTHIAZIDE 12.5 MG PO TABS: 12.5000 mg | ORAL_TABLET | Freq: Every day | ORAL | Status: DC

## 2015-08-09 NOTE — Progress Notes (Signed)
   D: Pt informed the writer that she "is happy and ready to go". Stated she was upset that she wasn't discharged this morning, but "thought about it and said she would act right so they could see she's ready". Pt states she has lots of support and that her sponsor and pastor visited her today. Pt has no questions or concerns.    A:  Support and encouragement was offered. 15 min checks continued for safety.  R: Pt remains safe.

## 2015-08-09 NOTE — Progress Notes (Signed)
  [facility] Adult Case Management Discharge Plan :  Will you be returning to the same living situation after discharge:  Yes,  home until [facility] screening on Thursday.  At discharge, do you have transportation home?: Friend coming after lunch today.  Do you have the ability to pay for your medications: Yes,  [facility]  Release of information consent forms completed and submitted to medical records by CSW.  Patient to Follow up at: Follow-up Information    Follow up with [facility].   Why:  Patient is being assessed for TCT on 7/17. Walk in between 8am-9am Monday through Friday for hospital follow-up/medication management/assessment for counseling services.    Contact information:   [address], [place] [address] Phone: [phone] Fax: [phone]      Follow up with [facility] On 08/11/2015.   Why:  Screening for possible admission on this date at 12:00PM. Please bring Photo ID/[place] proof of residence and [place] card. Thank you.    Contact information:   [address] [place], [place] [address] Phone: [phone] Fax: [phone]      Follow up with [facility].   Why:  Referral faxed. If you are interested in pursuing treatment at this facility, please contact [place] in admissions daily at 9:00AM to check status of referral/waitlist.    Contact information:   [address] [place], [place] [address] Phone: [phone] Fax: [phone]      Next level of care provider has access to [place] and Suicide Prevention discussed: Yes,  SPE completed with pt; pt declined to consent to family contact.  Have you used any form of tobacco in the last 30 days? (Cigarettes, Smokeless Tobacco, Cigars, and/or Pipes): Yes  Has patient been referred to the Quitline?: Patient refused referral  Patient has been referred for addiction treatment: Yes  Smart, Tenika Keeran LCSW 08/09/2015, 9:44 AM

## 2015-08-09 NOTE — BHH Group Notes (Signed)

## 2015-08-09 NOTE — Progress Notes (Signed)
Discharge Note:  Patient discharged home with family member.  Patient denied SI and HI.  Denied A/V hallucinations.  Suicide prevention information given and discussed with patient who stated she understood and had no questions.  Patient stated she received all her belongings, clothing, toiletries, misc items, 2 suitcases, purse, cell phone, cigarettes, [place] DL, SS card, prescriptions, medications, etc.   Patient stated she appreciated all assistance received from [facility] staff.  All required discharge information given to patient at discharge.

## 2015-08-09 NOTE — Progress Notes (Signed)
D:  Patient denied SI and HI, contracts for safety.  Denied A/V hallucinations.   A:  Medications administered per MD orders.  Patient refused naproxyn this morning.  Also stated that she takes zoloft at night.  Emotional support and encouragement given patient. R:  Safety maintained with 15 minute checks.  Patient stated she is ready for discharge today.
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
## 2015-08-09 NOTE — Discharge Summary (Signed)
Physician Discharge Summary Note  Patient:  Tracy Rose is an 50 y.o., female MRN:  [id number] DOB:  03/14/1965 Patient phone:  [phone] (home)  Patient address:   [place] [address],  Total Time spent with patient: Greater than 30 minutes  Date of Admission:  08/05/2015 Date of Discharge: 08-09-15  Reason for Admission: Worsening depression, Substance abuse   Principal Problem: MDD (major depressive disorder), recurrent, severe, with psychosis [facility]) Discharge Diagnoses: Patient Active Problem List   Diagnosis Date Noted  . MDD (major depressive disorder), recurrent, severe, with psychosis ([place]) [F33.3] 08/06/2015  . Benign essential hypertension antepartum [O10.019]   . Insomnia [G47.00]   . Anxiety state [F41.1]   . Severe manic bipolar 1 disorder with psychotic behavior ([place]) [F31.2] 08/05/2015  . Attention deficit hyperactivity disorder (ADHD) [F90.9] 06/15/2015  . Anxiety and depression [F41.8] 06/14/2015  . Submandibular swelling [R22.0, R22.1] 06/14/2015  . Breast mass [N63] 06/10/2015  . Chest pain [R07.9] 06/10/2015  . Essential hypertension, benign [I10] 06/10/2015  . Lymphadenopathy, inguinal [R59.9] 02/09/2015  . ADHD (attention deficit hyperactivity disorder) [F90.9] 08/15/2014  . Mild depression [F32.9] 12/15/2013  . Macromastia [N62] 04/07/2013  . Genital herpes [A60.00] 12/12/2011  . Tobacco abuse [Z72.0] 09/08/2010  . GOITER, MULTINODULAR [E04.2] 09/05/2007  . OBESITY, NOS [E66.9] 03/21/2006   Past Psychiatric History: Major depression, alcoholism  Past Medical History:  Past Medical History  Diagnosis Date  . Tobacco abuse 09/08/2010  . OBESITY, NOS 03/21/2006        . GOITER, MULTINODULAR 09/05/2007    Dr. Zada Girt [facility]in[facility]  [place] 2011: Stable to slightly smaller bilateral thyroid nodules since 09/04/2007. No new or enlarging thyroid nodules identified. Biopsy 08/2007: non neoplastic goiter   . Genital  herpes 12/12/2011  . Mild depression 12/15/2013  . Essential hypertension, benign 06/10/2015  . Anxiety state     Past Surgical History  Procedure Laterality Date  . Wisdom tooth extraction      @ age 65  . Radiology with anesthesia N/A 01/26/2014    Procedure: MRA NECK, MRI BRAIN WITH AND WITHOUT CONTRAST ;  Surgeon: Medication Radiologist, MD;  Location: [facility];  Service: Radiology;  Laterality: N/A;   Family History:  Family History  Problem Relation Age of Onset  . Arthritis Mother   . Diabetes Father   . Arthritis Father   . Hypertension Father   . Hyperlipidemia Father   . Heart disease Father   . Stroke Father   . Cancer Maternal Grandfather   . Migraines Neg Hx   . Cancer Sister    Family Psychiatric  History: See H&P  Social History:  History  Alcohol Use No     History  Drug Use No    Social History   Social History  . Marital Status: Divorced    Spouse Name: N/A  . Number of Children: 1  . Years of Education: Assoc.   Occupational History  . [employer]    Social History Main Topics  . Smoking status: Current Every Day Smoker -- 0.20 packs/day for 25 years    Types: Cigarettes  . Smokeless tobacco: Former Systems developer    Quit date: 10/28/2010  . Alcohol Use: No  . Drug Use: No  . Sexual Activity: Not Currently   Other Topics Concern  . None   Social History Narrative   Unemployed.  Associates Degree.  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  Caffeine Use: none   Hospital Course:  Tracy Rose reports that she was feeling overwhelmed and worsening depression. Also states that she was hearing noises like a fly buzzing and thought she saw one fly by. Patient states that the stressor was related to trying to get her home ready for her sister who was coming and has cancer; "Trying to get my daughter move moved out to have room for my sister and my mom; and I just got a new job." States that she has been off of her  medication for 2 weeks and then feel off of the wagon by "smoking weed to calm down; and then I brought some dope (crack cocaine). At this time patient endorses feelings of irritability, fatigue, scared, and feeling hot. States that she is not sure if she is seeing things this morning "I saw a water bug didn't know if it was real or not and didn't want to find out." Patient denies suicidal/homicidal ideation and paranoia.          Tracy Rose was admitted to the adult 300 unit where she was evaluated and her symptoms were identified. Medication management was discussed and implemented. Patient reported that in the past she did well on wellbutrin and zoloft. In the past she reported to Dr. Parke Poisson that she stopped the regimen due to feeling better rather than to side effects. [place] was restarted on these medications with positive improvement noted during her hospital stay.  She was encouraged to participate in unit programming. Medical problems were identified and treated appropriately. Home medication was restarted as needed.  She was evaluated each day by a clinical provider to ascertain the patient's response to treatment.  Improvement was noted by the patient's report of decreasing symptoms, improved sleep and appetite, affect, medication tolerance, behavior, and participation in unit programming.  The patient was asked each day to complete a self inventory noting mood, mental status, pain, new symptoms, anxiety and concerns. Patient also requested help from the treatment team for crack/cocaine abuse.          She responded well to medication and being in a therapeutic and supportive environment. Positive and appropriate behavior was noted and the patient was motivated for recovery.  She worked closely with the treatment team and case manager to develop a discharge plan with appropriate goals. Coping skills, problem solving as well as relaxation therapies were also part of the unit programming.         By  the day of discharge she was in much improved condition than upon admission. Patient requested discharge to"go home and take care of bills before I go to treatment." Patient has screening at [facility] on Thursday at 12pm. [place] for outpatient services and assessed for TCT. Patient also referred to [facility] and is on waitlist.   Symptoms were reported as significantly decreased or resolved completely. The patient denied SI/HI and voiced no AVH. She was motivated to continue taking medication with a goal of continued improvement in mental health.  TIFFAY KEARNES was discharged home with a plan to follow up as noted below.    Physical Findings: AIMS: Facial and Oral Movements Muscles of Facial Expression: None, normal Lips and Perioral Area: None, normal Jaw: None, normal Tongue: None, normal,Extremity Movements Upper (arms, wrists, hands, fingers): None, normal Lower (legs, knees, ankles, toes): None, normal, Trunk Movements Neck, shoulders, hips: None, normal, Overall Severity Severity of abnormal movements (highest score from questions above): None, normal Incapacitation due  to abnormal movements: None, normal Patient's awareness of abnormal movements (rate only patient's report): No Awareness, Dental Status Current problems with teeth and/or dentures?: No Does patient usually wear dentures?: No  CIWA:  CIWA-Ar Total: 1 COWS:  COWS Total Score: 1  Musculoskeletal: Strength & Muscle Tone: within normal limits Gait & Station: normal Patient leans: N/A  Psychiatric Specialty Exam: Physical Exam  Constitutional: She appears well-developed.  HENT:  Head: Normocephalic.  Eyes: Pupils are equal, round, and reactive to light.  Neck: Normal range of motion.  Cardiovascular: Normal rate.   Respiratory: Effort normal.  GI: Soft.  Genitourinary:  Denies any issues in this area  Musculoskeletal: Normal range of motion.  Neurological: She is alert.  Skin: Skin is warm.    Review  of Systems  Constitutional: Negative.   HENT: Negative.   Eyes: Negative.   Respiratory: Negative.   Cardiovascular: Negative.   Gastrointestinal: Negative.   Genitourinary: Negative.   Musculoskeletal: Negative.   Neurological: Negative.   Endo/Heme/Allergies: Negative.   Psychiatric/Behavioral: Positive for depression (Stable) and substance abuse (Hx. Alcohol use disorder). Negative for suicidal ideas, hallucinations and memory loss. The patient has insomnia (Stable). The patient is not nervous/anxious.     Blood pressure 125/75, pulse 80, temperature 97.9 F (36.6 C), temperature source Oral, resp. rate 18, height 5\' 6"  (1.676 m), weight 118.389 kg (261 lb), last menstrual period 08/05/2015, SpO2 100 %.Body mass index is 42.15 kg/(m^2).  See Md's SRA   Have you used any form of tobacco in the last 30 days? (Cigarettes, Smokeless Tobacco, Cigars, and/or Pipes): Yes  Has this patient used any form of tobacco in the last 30 days? (Cigarettes, Smokeless Tobacco, Cigars, and/or Pipes) No  Blood Alcohol level:  Lab Results  Component Value Date   ETH <5 99991111   Metabolic Disorder Labs:  Lab Results  Component Value Date   HGBA1C 5.8 03/12/2013   No results found for: PROLACTIN Lab Results  Component Value Date   CHOL 223* 06/14/2015   TRIG 218* 06/14/2015   HDL 51 06/14/2015   CHOLHDL 4.4 06/14/2015   VLDL 44* 06/14/2015   LDLCALC 128 06/14/2015   LDLCALC 120* 03/12/2013   See Psychiatric Specialty Exam and Suicide Risk Assessment completed by Attending Physician prior to discharge.  Discharge destination:  [facility]  Is patient on multiple antipsychotic therapies at discharge:  No   Has Patient had three or more failed trials of antipsychotic monotherapy by history:  No  Recommended Plan for Multiple Antipsychotic Therapies: NA    Medication List    STOP taking these medications        acyclovir 400 MG tablet  Commonly known as:  ZOVIRAX      Cholecalciferol 1000 units Tbdp     clonazePAM 0.5 MG tablet  Commonly known as:  KLONOPIN     nortriptyline 25 MG capsule  Commonly known as:  PAMELOR     OVER THE COUNTER MEDICATION     SUMAtriptan 50 MG tablet  Commonly known as:  IMITREX     venlafaxine XR 75 MG 24 hr capsule  Commonly known as:  EFFEXOR XR     Vitamin B-12 5000 MCG Subl      TAKE these medications      Indication   buPROPion 150 MG 24 hr tablet  Commonly known as:  WELLBUTRIN XL  Take 1 tablet (150 mg total) by mouth daily. For depression   Indication:  Major Depressive Disorder  hydrochlorothiazide 12.5 MG tablet  Commonly known as:  HYDRODIURIL  Take 1 tablet (12.5 mg total) by mouth daily. For high blood pressure   Indication:  High Blood Pressure     naproxen 500 MG tablet  Commonly known as:  NAPROSYN  Take 1 tablet (500 mg total) by mouth 2 (two) times daily. For pain managment   Indication:  Mild to Moderate Pain     sertraline 50 MG tablet  Commonly known as:  ZOLOFT  Take 1 tablet (50 mg total) by mouth daily. For depression   Indication:  Major Depressive Disorder     traZODone 50 MG tablet  Commonly known as:  DESYREL  Take 1 tablet (50 mg total) by mouth at bedtime as needed for sleep.   Indication:  Trouble Sleeping       Follow-up Information    Follow up with [facility].   Why:  Patient is being assessed for TCT on 7/17. Walk in between 8am-9am Monday through Friday for hospital follow-up/medication management/assessment for counseling services.    Contact information:   [address], [place] [address] Phone: [phone] Fax: [phone]      Follow up with [facility] On 08/11/2015.   Why:  Screening for possible admission on this date at 12:00PM. Please bring Photo ID/[place] proof of residence and [place] card. Thank you.    Contact information:   [address] [place], [place] [address] Phone: [phone] Fax: [phone]       Follow up with [facility].   Why:  Referral faxed. If you are interested in pursuing treatment at this facility, please contact [place] in admissions daily at 9:00AM to check status of referral/waitlist.    Contact information:   [address] [place], [place] [address] Phone: [phone] Fax: [phone]     Follow-up recommendations:  Activity:  As tolerated Diet: As recommended by your primary care doctor. Keep all scheduled follow-up appointments as recommended.  Comments: Patient is instructed prior to discharge to: Take all medications as prescribed by his/her mental healthcare provider. Report any adverse effects and or reactions from the medicines to his/her outpatient provider promptly. Patient has been instructed & cautioned: To not engage in alcohol and or illegal drug use while on prescription medicines. In the event of worsening symptoms, patient is instructed to call the crisis hotline, 911 and or go to the nearest ED for appropriate evaluation and treatment of symptoms. To follow-up with his/her primary care provider for your other medical issues, concerns and or health care needs.   SignedElmarie Shiley, NP 08/09/2015, 5:15 PM Patient seen, Suicide Assessment Completed.  Disposition Plan Reviewed
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
## 2015-09-13 ENCOUNTER — Other Ambulatory Visit (HOSPITAL_COMMUNITY): Payer: Self-pay | Admitting: *Deleted

## 2015-09-13 ENCOUNTER — Encounter (HOSPITAL_COMMUNITY): Payer: Self-pay

## 2015-09-14 ENCOUNTER — Inpatient Hospital Stay (HOSPITAL_COMMUNITY): Admission: RE | Admit: 2015-09-14 | Payer: Self-pay | Source: Ambulatory Visit

## 2015-09-19 ENCOUNTER — Encounter (HOSPITAL_COMMUNITY)
Admission: RE | Admit: 2015-09-19 | Discharge: 2015-09-19 | Disposition: A | Discharge status: Home / Self Care | Payer: Medicaid Other | Source: Ambulatory Visit | Attending: Surgery | Admitting: Surgery

## 2015-09-19 ENCOUNTER — Encounter (HOSPITAL_COMMUNITY): Payer: Self-pay

## 2015-09-19 DIAGNOSIS — Z01818 Encounter for other preprocedural examination: Secondary | ICD-10-CM | POA: Insufficient documentation

## 2015-09-19 NOTE — Pre-Procedure Instructions (Addendum)
Tracy Rose  09/19/2015      [facility] - [address], [address] [place] [address] Phone: [phone] Fax: [phone]    Your procedure is scheduled on 09/21/15.  Report to [facility] Admitting at 630 A.M.  Call this number if you have problems the morning of surgery:  [phone]   Remember:  Do not eat food or drink liquids after midnight.  Take these medicines the morning of surgery with A SIP OF WATER wellbutrin,sertraline,venlafaxine  STOP all herbel meds, nsaids (aleve,naproxen,advil,ibuprofen) 5 days prior to surgery starting now including vitamins,aspirin   Do not wear jewelry, make-up or nail polish.  Do not wear lotions, powders, or perfumes, or deoderant.  Do not shave 48 hours prior to surgery.  Men may shave face and neck.  Do not bring valuables to the hospital.  [facility] is not responsible for any belongings or valuables.  Contacts, dentures or bridgework may not be worn into surgery.  Leave your suitcase in the car.  After surgery it may be brought to your room.  For patients admitted to the hospital, discharge time will be determined by your treatment team.  Patients discharged the day of surgery will not be allowed to drive home.   Name and phone number of your driver: Special instructions:   Special Instructions: [facility] - Preparing for Surgery  Before surgery, you can play an important role.  Because skin is not sterile, your skin needs to be as free of germs as possible.  You can reduce the number of germs on you skin by washing with CHG (chlorahexidine gluconate) soap before surgery.  CHG is an antiseptic cleaner which kills germs and bonds with the skin to continue killing germs even after washing.  Please DO NOT use if you have an allergy to CHG or antibacterial soaps.  If your skin becomes reddened/irritated stop using the CHG and  inform your nurse when you arrive at Short Stay.  Do not shave (including legs and underarms) for at least 48 hours prior to the first CHG shower.  You may shave your face.  Please follow these instructions carefully:   1.  Shower with CHG Soap the night before surgery and the morning of Surgery.  2.  If you choose to wash your hair, wash your hair first as usual with your normal shampoo.  3.  After you shampoo, rinse your hair and body thoroughly to remove the Shampoo.  4.  Use CHG as you would any other liquid soap.  You can apply chg directly  to the skin and wash gently with scrungie or a clean washcloth.  5.  Apply the CHG Soap to your body ONLY FROM THE NECK DOWN.  Do not use on open wounds or open sores.  Avoid contact with your eyes ears, mouth and genitals (private parts).  Wash genitals (private parts)       with your normal soap.  6.  Wash thoroughly, paying special attention to the area where your surgery will be performed.  7.  Thoroughly rinse your body with warm water from the neck down.  8.  DO NOT shower/wash with your normal soap after using and rinsing off the CHG Soap.  9.  Pat yourself dry with a clean towel.            10.  Wear clean pajamas.  11.  Place clean sheets on your bed the night of your first shower and do not sleep with pets.  Day of Surgery  Do not apply any lotions/deodorants the morning of surgery.  Please wear clean clothes to the hospital/surgery center.  Please read over the  fact sheets that you were given.

## 2015-09-20 ENCOUNTER — Ambulatory Visit
Admission: RE | Admit: 2015-09-20 | Discharge: 2015-09-20 | Disposition: A | Discharge status: Home / Self Care | Payer: Medicaid Other | Source: Ambulatory Visit | Attending: Surgery | Admitting: Surgery

## 2015-09-21 ENCOUNTER — Ambulatory Visit (HOSPITAL_COMMUNITY): Payer: Medicaid Other | Admitting: Certified Registered Nurse Anesthetist

## 2015-09-21 ENCOUNTER — Encounter (HOSPITAL_COMMUNITY): Payer: Self-pay | Admitting: Certified Registered Nurse Anesthetist

## 2015-09-21 ENCOUNTER — Ambulatory Visit (HOSPITAL_COMMUNITY)
Admission: RE | Admit: 2015-09-21 | Discharge: 2015-09-21 | Disposition: A | Discharge status: Home / Self Care | Payer: Medicaid Other | Source: Ambulatory Visit | Attending: Surgery | Admitting: Surgery

## 2015-09-21 ENCOUNTER — Ambulatory Visit
Admission: RE | Admit: 2015-09-21 | Discharge: 2015-09-21 | Disposition: A | Discharge status: Home / Self Care | Payer: Medicaid Other | Source: Ambulatory Visit | Attending: General Surgery | Admitting: General Surgery

## 2015-09-21 ENCOUNTER — Ambulatory Visit: Admit: 2015-09-21 | Payer: Medicaid Other | Admitting: Surgery

## 2015-09-21 ENCOUNTER — Encounter (HOSPITAL_COMMUNITY)
Admission: RE | Disposition: A | Discharge status: Home / Self Care | Payer: Self-pay | Source: Ambulatory Visit | Attending: Surgery

## 2015-09-21 DIAGNOSIS — N6012 Diffuse cystic mastopathy of left breast: Secondary | ICD-10-CM | POA: Insufficient documentation

## 2015-09-21 DIAGNOSIS — Z6841 Body Mass Index (BMI) 40.0 and over, adult: Secondary | ICD-10-CM | POA: Insufficient documentation

## 2015-09-21 DIAGNOSIS — N6320 Unspecified lump in the left breast, unspecified quadrant: Secondary | ICD-10-CM

## 2015-09-21 DIAGNOSIS — Z87891 Personal history of nicotine dependence: Secondary | ICD-10-CM | POA: Insufficient documentation

## 2015-09-21 DIAGNOSIS — I10 Essential (primary) hypertension: Secondary | ICD-10-CM | POA: Insufficient documentation

## 2015-09-21 DIAGNOSIS — D242 Benign neoplasm of left breast: Secondary | ICD-10-CM | POA: Insufficient documentation

## 2015-09-21 DIAGNOSIS — Z86718 Personal history of other venous thrombosis and embolism: Secondary | ICD-10-CM | POA: Diagnosis not present

## 2015-09-21 DIAGNOSIS — N63 Unspecified lump in breast: Secondary | ICD-10-CM | POA: Diagnosis present

## 2015-09-21 HISTORY — PX: BREAST EXCISIONAL BIOPSY: SUR124

## 2015-09-21 HISTORY — PX: BREAST LUMPECTOMY WITH RADIOACTIVE SEED LOCALIZATION: SHX6424

## 2015-09-21 LAB — HCG, SERUM, QUALITATIVE: PREG SERUM: NEGATIVE

## 2015-09-21 LAB — BASIC METABOLIC PANEL
Anion gap: 6 (ref 5–15)
BUN: 9 mg/dL (ref 6–20)
CALCIUM: 9.2 mg/dL (ref 8.9–10.3)
CO2: 25 mmol/L (ref 22–32)
CREATININE: 1.3 mg/dL — AB (ref 0.44–1.00)
Chloride: 109 mmol/L (ref 101–111)
GFR calc Af Amer: 56 mL/min — ABNORMAL LOW (ref >60)
GFR, EST NON AFRICAN AMERICAN: 48 mL/min — AB (ref >60)
GLUCOSE: 89 mg/dL (ref 65–99)
POTASSIUM: 3.5 mmol/L (ref 3.5–5.1)
SODIUM: 140 mmol/L (ref 135–145)

## 2015-09-21 LAB — CBC
HCT: 40.1 % (ref 36.0–46.0)
Hemoglobin: 12.6 g/dL (ref 12.0–15.0)
MCH: 26.3 pg (ref 26.0–34.0)
MCHC: 31.4 g/dL (ref 30.0–36.0)
MCV: 83.7 fL (ref 78.0–100.0)
PLATELETS: 310 10*3/uL (ref 150–400)
RBC: 4.79 MIL/uL (ref 3.87–5.11)
RDW: 15 % (ref 11.5–15.5)
WBC: 6 10*3/uL (ref 4.0–10.5)

## 2015-09-21 SURGERY — BREAST LUMPECTOMY WITH RADIOACTIVE SEED LOCALIZATION
Anesthesia: General | Site: Breast | Laterality: Left

## 2015-09-21 MED ORDER — ROCURONIUM BROMIDE 100 MG/10ML IV SOLN
INTRAVENOUS | Status: DC | PRN
  Administered 2015-09-21: 30 mg via INTRAVENOUS

## 2015-09-21 MED ORDER — SUGAMMADEX SODIUM 200 MG/2ML IV SOLN
INTRAVENOUS | Status: AC
  Filled 2015-09-21: qty 2

## 2015-09-21 MED ORDER — BUPIVACAINE-EPINEPHRINE 0.25% -1:200000 IJ SOLN
INTRAMUSCULAR | Status: DC | PRN
  Administered 2015-09-21: 10 mL

## 2015-09-21 MED ORDER — FENTANYL CITRATE (PF) 100 MCG/2ML IJ SOLN
INTRAMUSCULAR | Status: AC
  Filled 2015-09-21: qty 2

## 2015-09-21 MED ORDER — PHENYLEPHRINE 40 MCG/ML (10ML) SYRINGE FOR IV PUSH (FOR BLOOD PRESSURE SUPPORT)
PREFILLED_SYRINGE | INTRAVENOUS | Status: AC
  Filled 2015-09-21: qty 10

## 2015-09-21 MED ORDER — DEXAMETHASONE SODIUM PHOSPHATE 10 MG/ML IJ SOLN
INTRAMUSCULAR | Status: DC | PRN
  Administered 2015-09-21: 10 mg via INTRAVENOUS

## 2015-09-21 MED ORDER — SUCCINYLCHOLINE CHLORIDE 20 MG/ML IJ SOLN
INTRAMUSCULAR | Status: DC | PRN
  Administered 2015-09-21: 100 mg via INTRAVENOUS

## 2015-09-21 MED ORDER — PROPOFOL 10 MG/ML IV BOLUS
INTRAVENOUS | Status: DC | PRN
  Administered 2015-09-21: 200 mg via INTRAVENOUS

## 2015-09-21 MED ORDER — LACTATED RINGERS IV SOLN
INTRAVENOUS | Status: DC
  Administered 2015-09-21: 10:00:00 via INTRAVENOUS

## 2015-09-21 MED ORDER — HYDROCODONE-ACETAMINOPHEN 7.5-325 MG PO TABS: 1.0000 | ORAL_TABLET | Freq: Once | ORAL | Status: DC | PRN

## 2015-09-21 MED ORDER — FENTANYL CITRATE (PF) 100 MCG/2ML IJ SOLN: 25.0000 ug | INTRAMUSCULAR | Status: DC | PRN

## 2015-09-21 MED ORDER — 0.9 % SODIUM CHLORIDE (POUR BTL) OPTIME
TOPICAL | Status: DC | PRN
  Administered 2015-09-21: 1000 mL

## 2015-09-21 MED ORDER — PHENYLEPHRINE HCL 10 MG/ML IJ SOLN
INTRAMUSCULAR | Status: DC | PRN
  Administered 2015-09-21 (×4): 80 ug via INTRAVENOUS
  Administered 2015-09-21 (×2): 120 ug via INTRAVENOUS

## 2015-09-21 MED ORDER — DEXAMETHASONE SODIUM PHOSPHATE 10 MG/ML IJ SOLN
INTRAMUSCULAR | Status: AC
  Filled 2015-09-21: qty 1

## 2015-09-21 MED ORDER — PHENYLEPHRINE HCL 10 MG/ML IJ SOLN
INTRAVENOUS | Status: DC | PRN
  Administered 2015-09-21: 40 ug/min via INTRAVENOUS

## 2015-09-21 MED ORDER — ONDANSETRON HCL 4 MG/2ML IJ SOLN: 4.0000 mg | INTRAMUSCULAR | Status: DC | PRN

## 2015-09-21 MED ORDER — SUCCINYLCHOLINE CHLORIDE 200 MG/10ML IV SOSY
PREFILLED_SYRINGE | INTRAVENOUS | Status: AC
  Filled 2015-09-21: qty 10

## 2015-09-21 MED ORDER — GABAPENTIN 300 MG PO CAPS
300.0000 mg | ORAL_CAPSULE | ORAL | Status: AC
  Administered 2015-09-21: 300 mg via ORAL
  Filled 2015-09-21: qty 1

## 2015-09-21 MED ORDER — MIDAZOLAM HCL 5 MG/5ML IJ SOLN
INTRAMUSCULAR | Status: DC | PRN
  Administered 2015-09-21: 2 mg via INTRAVENOUS

## 2015-09-21 MED ORDER — OXYCODONE-ACETAMINOPHEN 5-325 MG PO TABS: 1.0000 | ORAL_TABLET | ORAL | Status: DC | PRN

## 2015-09-21 MED ORDER — PROMETHAZINE HCL 25 MG/ML IJ SOLN: 6.2500 mg | INTRAMUSCULAR | Status: DC | PRN

## 2015-09-21 MED ORDER — CELECOXIB 200 MG PO CAPS
400.0000 mg | ORAL_CAPSULE | ORAL | Status: AC
  Administered 2015-09-21: 400 mg via ORAL
  Filled 2015-09-21: qty 2

## 2015-09-21 MED ORDER — MIDAZOLAM HCL 2 MG/2ML IJ SOLN
INTRAMUSCULAR | Status: AC
  Filled 2015-09-21: qty 2

## 2015-09-21 MED ORDER — ACETAMINOPHEN 500 MG PO TABS
1000.0000 mg | ORAL_TABLET | ORAL | Status: AC
  Administered 2015-09-21: 1000 mg via ORAL
  Filled 2015-09-21: qty 2

## 2015-09-21 MED ORDER — ONDANSETRON HCL 4 MG/2ML IJ SOLN
INTRAMUSCULAR | Status: AC
  Filled 2015-09-21: qty 2

## 2015-09-21 MED ORDER — LACTATED RINGERS IV SOLN
INTRAVENOUS | Status: DC | PRN
  Administered 2015-09-21 (×2): via INTRAVENOUS

## 2015-09-21 MED ORDER — CEFAZOLIN SODIUM-DEXTROSE 2-4 GM/100ML-% IV SOLN
2.0000 g | INTRAVENOUS | Status: AC
  Administered 2015-09-21: 2 g via INTRAVENOUS
  Filled 2015-09-21: qty 100

## 2015-09-21 MED ORDER — MORPHINE SULFATE (PF) 2 MG/ML IV SOLN: 2.0000 mg | INTRAVENOUS | Status: DC | PRN

## 2015-09-21 MED ORDER — LIDOCAINE 2% (20 MG/ML) 5 ML SYRINGE
INTRAMUSCULAR | Status: AC
  Filled 2015-09-21: qty 5

## 2015-09-21 MED ORDER — ONDANSETRON HCL 4 MG/2ML IJ SOLN
INTRAMUSCULAR | Status: DC | PRN
  Administered 2015-09-21: 4 mg via INTRAVENOUS

## 2015-09-21 MED ORDER — ROCURONIUM BROMIDE 10 MG/ML (PF) SYRINGE
PREFILLED_SYRINGE | INTRAVENOUS | Status: AC
  Filled 2015-09-21: qty 10

## 2015-09-21 MED ORDER — LIDOCAINE HCL (CARDIAC) 20 MG/ML IV SOLN
INTRAVENOUS | Status: DC | PRN
  Administered 2015-09-21: 80 mg via INTRAVENOUS

## 2015-09-21 MED ORDER — PROPOFOL 10 MG/ML IV BOLUS
INTRAVENOUS | Status: AC
  Filled 2015-09-21: qty 20

## 2015-09-21 MED ORDER — FENTANYL CITRATE (PF) 100 MCG/2ML IJ SOLN
INTRAMUSCULAR | Status: DC | PRN
  Administered 2015-09-21 (×2): 50 ug via INTRAVENOUS

## 2015-09-21 MED ORDER — SUGAMMADEX SODIUM 200 MG/2ML IV SOLN
INTRAVENOUS | Status: DC | PRN
  Administered 2015-09-21: 200 mg via INTRAVENOUS

## 2015-09-21 MED ORDER — CHLORHEXIDINE GLUCONATE CLOTH 2 % EX PADS: 6.0000 | MEDICATED_PAD | Freq: Once | CUTANEOUS | Status: DC

## 2015-09-21 MED ORDER — BUPIVACAINE-EPINEPHRINE (PF) 0.25% -1:200000 IJ SOLN
INTRAMUSCULAR | Status: AC
  Filled 2015-09-21: qty 30

## 2015-09-21 MED ORDER — OXYCODONE-ACETAMINOPHEN 5-325 MG PO TABS: 1.0000 | ORAL_TABLET | ORAL | 0 refills | Status: DC | PRN

## 2015-09-21 SURGICAL SUPPLY — 51 items
APL SKNCLS STERI-STRIP NONHPOA (GAUZE/BANDAGES/DRESSINGS) ×1
APPLIER CLIP 9.375 MED OPEN (MISCELLANEOUS)
APR CLP MED 9.3 20 MLT OPN (MISCELLANEOUS)
BENZOIN TINCTURE PRP APPL 2/3 (GAUZE/BANDAGES/DRESSINGS) ×3 IMPLANT
BINDER BREAST LRG (GAUZE/BANDAGES/DRESSINGS) IMPLANT
BINDER BREAST XLRG (GAUZE/BANDAGES/DRESSINGS) IMPLANT
BLADE SURG 15 STRL LF DISP TIS (BLADE) ×1 IMPLANT
BLADE SURG 15 STRL SS (BLADE) ×3
CANISTER SUCTION 2500CC (MISCELLANEOUS) IMPLANT
CHLORAPREP W/TINT 26ML (MISCELLANEOUS) ×3 IMPLANT
CLIP APPLIE 9.375 MED OPEN (MISCELLANEOUS) IMPLANT
CLOSURE WOUND 1/2 X4 (GAUZE/BANDAGES/DRESSINGS) ×1
COVER PROBE W GEL 5X96 (DRAPES) ×7 IMPLANT
COVER SURGICAL LIGHT HANDLE (MISCELLANEOUS) ×3 IMPLANT
DEVICE DUBIN SPECIMEN MAMMOGRA (MISCELLANEOUS) ×3 IMPLANT
DRAPE CHEST BREAST 15X10 FENES (DRAPES) ×3 IMPLANT
DRAPE UTILITY XL STRL (DRAPES) ×3 IMPLANT
DRSG TEGADERM 4X4.75 (GAUZE/BANDAGES/DRESSINGS) ×3 IMPLANT
ELECT BLADE 6.5 EXT (BLADE) ×2 IMPLANT
ELECT CAUTERY BLADE 6.4 (BLADE) ×3 IMPLANT
ELECT REM PT RETURN 9FT ADLT (ELECTROSURGICAL) ×3
ELECTRODE REM PT RTRN 9FT ADLT (ELECTROSURGICAL) ×1 IMPLANT
GAUZE SPONGE 2X2 8PLY STRL LF (GAUZE/BANDAGES/DRESSINGS) ×1 IMPLANT
GLOVE BIO SURGEON STRL SZ7 (GLOVE) ×7 IMPLANT
GLOVE BIOGEL PI IND STRL 7.5 (GLOVE) ×1 IMPLANT
GLOVE BIOGEL PI INDICATOR 7.5 (GLOVE) ×4
GLOVE ECLIPSE 6.0 STRL STRAW (GLOVE) ×2 IMPLANT
GOWN STRL REUS W/ TWL LRG LVL3 (GOWN DISPOSABLE) ×2 IMPLANT
GOWN STRL REUS W/TWL LRG LVL3 (GOWN DISPOSABLE) ×9
KIT BASIN OR (CUSTOM PROCEDURE TRAY) ×3 IMPLANT
KIT MARKER MARGIN INK (KITS) ×3 IMPLANT
LIGHT WAVEGUIDE WIDE FLAT (MISCELLANEOUS) ×2 IMPLANT
NDL HYPO 25X1 1.5 SAFETY (NEEDLE) ×1 IMPLANT
NEEDLE HYPO 25X1 1.5 SAFETY (NEEDLE) ×3 IMPLANT
NS IRRIG 1000ML POUR BTL (IV SOLUTION) ×2 IMPLANT
PACK SURGICAL SETUP 50X90 (CUSTOM PROCEDURE TRAY) ×3 IMPLANT
PENCIL BUTTON HOLSTER BLD 10FT (ELECTRODE) ×3 IMPLANT
SPONGE GAUZE 2X2 STER 10/PKG (GAUZE/BANDAGES/DRESSINGS) ×2
SPONGE LAP 18X18 X RAY DECT (DISPOSABLE) ×3 IMPLANT
STRIP CLOSURE SKIN 1/2X4 (GAUZE/BANDAGES/DRESSINGS) ×2 IMPLANT
SUT MNCRL AB 4-0 PS2 18 (SUTURE) ×3 IMPLANT
SUT SILK 2 0 SH (SUTURE) IMPLANT
SUT VIC AB 3-0 SH 27 (SUTURE) ×3
SUT VIC AB 3-0 SH 27X BRD (SUTURE) ×1 IMPLANT
SYR BULB 3OZ (MISCELLANEOUS) ×3 IMPLANT
SYR CONTROL 10ML LL (SYRINGE) ×3 IMPLANT
TOWEL OR 17X24 6PK STRL BLUE (TOWEL DISPOSABLE) ×3 IMPLANT
TOWEL OR 17X26 10 PK STRL BLUE (TOWEL DISPOSABLE) ×3 IMPLANT
TUBE CONNECTING 12'X1/4 (SUCTIONS) ×1
TUBE CONNECTING 12X1/4 (SUCTIONS) ×1 IMPLANT
YANKAUER SUCT BULB TIP NO VENT (SUCTIONS) ×2 IMPLANT

## 2015-09-21 NOTE — H&P (Signed)
The patient is a 50 year old female who presents with a breast mass. Referred by Dr. Enrique Sack for complex sclerosing lesion left breast  This is a 50 year old female who recently began experiencing bilateral breast pain left side worse than right. She had a bilateral diagnostic mammogram on 06/08/15. She had not had a mammogram in the last 10 years. The mammogram and ultrasound were unremarkable except for a indeterminate irregular mass at 1:00 on the left side. This is located 9 cm from the nipple measuring 0.7 x 0.3 x 0.6 cm. Ultrasound of the axilla was normal. She underwent 2 attempts at biopsy of this area. The first attempt did not biopsy the correct lesion. However on 06/21/15 she underwent tomo guided left breast biopsy. Clip was placed. Pathology showed a complex sclerosing lesion with microcalcifications. She presents now to discuss surgical excision.  The patient feels like her breasts are always tender. She does drink a lot of caffeine every day. Her breasts are quite large and pendulous and cause a lot of back and shoulder pain. No family history of breast cancer. No previous breast surgeries.  CLINICAL DATA: 50 year old female presenting for evaluation of focal pain in the left breast.  EXAM: DIGITAL DIAGNOSTIC BILATERAL MAMMOGRAM  ULTRASOUND LEFT BREAST  COMPARISON: Previous exam(s).  ACR Breast Density Category b: There are scattered areas of fibroglandular density.  FINDINGS: A BB has been placed along the medial aspect of the left breast. No suspicious mammographic findings are seen immediately deep to this palpable marker. However, in the central slightly superior left breast in the middle depth there is an irregular mass measuring approximately 9 mm. No suspicious calcifications, masses or areas of distortion are seen in the bilateral breasts.  On physical exam, no discrete palpable mass can be identified in the superior or upper-inner left  breast, though significant tenderness is elicited with palpation.  Targeted ultrasound is performed, showing an irregular hypoechoic mass at 1 o'clock, 9 cm from the nipple measuring 0.7 x 0.3 x 0.6 cm. No blood flow was identified on color Doppler imaging. Ultrasound of the left axilla demonstrates multiple normal-appearing lymph nodes.  IMPRESSION: 1. No definite mammographic or targeted sonographic correlate for the pain in the left breast, which appears to be nonfocal.  2. There is an indeterminate irregular mass at 1 o'clock.  3. No evidence of left axillary lymphadenopathy.  4. No mammographic evidence of right breast malignancy.  RECOMMENDATION: 1. Ultrasound-guided biopsy of the left breast mass is recommended and has been scheduled for 06/15/2015 at 1 p.m.  2. No definite mammographic or targeted sonographic correlate for the left breast pain. Clinical follow-up is recommended, and any further workup should be based on clinical grounds.  I have discussed the findings and recommendations with the patient. Results were also provided in writing at the conclusion of the visit. If applicable, a reminder letter will be sent to the patient regarding the next appointment.  BI-RADS CATEGORY 4: Suspicious.   Electronically Signed By: Ammie Ferrier M.D. On: 06/08/2015 16:50   CLINICAL DATA: 9 mm sonographically occult upper central left breast mass.  EXAM: BREAST STEREOTACTIC CORE NEEDLE BIOPSY  COMPARISON: Previous exams.  FINDINGS: The patient and I discussed the procedure of stereotactic-guided biopsy including benefits and alternatives. We discussed the high likelihood of a successful procedure. We discussed the risks of the procedure including infection, bleeding, tissue injury, clip migration, and inadequate sampling. Informed written consent was given. The usual time out protocol was performed immediately prior to  the  procedure.  Using sterile technique and 1% Lidocaine as local anesthetic, under stereotactic guidance, a 9 gauge vacuum assisted device was used to perform core needle biopsy of the recently demonstrated 9 mm mass in the upper central left breast using a cephalad approach.  At the conclusion of the procedure, a coil shaped tissue marker clip was deployed into the biopsy cavity. Follow-up 2-view mammogram was performed and dictated separately.  IMPRESSION: Stereotactic-guided biopsy of a 9 mm upper central left breast mass. No apparent complications.  Electronically Signed: By: Claudie Revering M.D. On: 06/21/2015 15:36    CLINICAL DATA: Status post stereotactic guided core needle biopsy of a sonographically occult 9 mm upper central left breast mass.  EXAM: DIAGNOSTIC LEFT MAMMOGRAM POST STEREOTACTIC BIOPSY  COMPARISON: Previous exam(s).  FINDINGS: Mammographic images were obtained following stereotactic guided biopsy of the recently demonstrated 9 mm upper central left breast mass. These demonstrate post biopsy changes and a coil shaped biopsy marker clip at the location of the targeted mass.  IMPRESSION: Appropriate clip deployment following left breast stereotactic guided core needle biopsy.  Final Assessment: Post Procedure Mammograms for Marker Placement   Electronically Signed By: Claudie Revering M.D. 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 This was found to be concordant by Dr. Claudie Revering. Pathology results were discussed with the patient by telephone. The patient reported doing well after the biopsy with tenderness and minimal oozing at the site. Post biopsy instructions and care were reviewed and questions were answered. The patient was encouraged to call [facility] for any additional concerns. Surgical  consultation has been arranged with Dr. Donnie Mesa at [facility] on July 01, 2015. Pathology results reported by Terie Purser, RN on 06/22/2015. Electronically Signed By: Claudie Revering M.D. On: 06/22/2015 13:40       Other Problems  High blood pressure Lump In Breast Thyroid Disease  Past Surgical History  Oral Surgery  Diagnostic Studies History  Colonoscopy never Mammogram within last year Pap Smear 1-5 years ago  Allergies  No Known Drug Allergies   Medication History  HydroCHLOROthiazide (12.5MG  Tablet, Oral) Active. Nortriptyline HCl (25MG  Capsule, Oral) Active. Venlafaxine HCl ER (75MG  Capsule ER 24HR, Oral) Active. Vitamin D (Cholecalciferol) (1000UNIT Tablet, Oral two times daily) Active. ClonazePAM (0.5MG  Tablet, Oral) Active. Naproxen Sodium (550MG  Tablet, Oral) Active. Medications Reconciled  Social History  Alcohol use Remotely quit alcohol use. Caffeine use Coffee, Tea. No drug use Tobacco use Former smoker.  Family History  Arthritis Father, Mother. Cerebrovascular Accident Father. Diabetes Mellitus Father, Mother. Heart disease in female family member before age 82 Hypertension Father. Thyroid problems Mother.  Pregnancy / Birth History  Age at menarche 32 years. Para 1 Regular periods     Review of Systems  General Present- Fatigue and Weight Gain. Not Present- Appetite Loss, Chills, Fever, Night Sweats and Weight Loss. HEENT Present- Hoarseness, Ringing in the Ears and Sinus Pain. Not Present- Earache, Hearing Loss, Nose Bleed, Oral Ulcers, Seasonal Allergies, Sore Throat, Visual Disturbances, Wears glasses/contact lenses and Yellow Eyes. Respiratory Present- Snoring. Not Present- Bloody sputum, Chronic Cough, Difficulty Breathing and Wheezing. Breast Present- Breast Mass, Breast Pain and Skin Changes. Not Present- Nipple Discharge. Cardiovascular Present- Difficulty Breathing  Lying Down and Rapid Heart Rate. Not Present- Chest Pain, Leg Cramps, Palpitations, Shortness of Breath and Swelling of Extremities. Gastrointestinal Present- Abdominal Pain, Bloating, Nausea and Rectal Pain. Not Present-  Bloody Stool, Change in Bowel Habits, Chronic diarrhea, Constipation, Difficulty Swallowing, Excessive gas, Gets full quickly at meals, Hemorrhoids, Indigestion and Vomiting. Female Genitourinary Not Present- Frequency, Nocturia, Painful Urination, Pelvic Pain and Urgency. Musculoskeletal Present- Back Pain and Muscle Pain. Not Present- Joint Pain, Joint Stiffness, Muscle Weakness and Swelling of Extremities. Neurological Not Present- Decreased Memory, Fainting, Headaches, Numbness, Seizures, Tingling, Tremor, Trouble walking and Weakness. Psychiatric Not Present- Anxiety, Bipolar, Change in Sleep Pattern, Depression, Fearful and Frequent crying. Endocrine Present- Hot flashes. Not Present- Cold Intolerance, Excessive Hunger, Hair Changes, Heat Intolerance and New Diabetes. Hematology Present- Gland problems. Not Present- Easy Bruising, Excessive bleeding, HIV and Persistent Infections.  Vitals  Weight: 265 lb Height: 65.5in Body Surface Area: 2.24 m Body Mass Index: 43.43 kg/m  Temp.: 97.44F  Pulse: 88 (Regular)  BP: 136/94 (Sitting, Left Arm, Standard)      Physical Exam  The physical exam findings are as follows: Note:WDWN in NAD HEENT: EOMI, sclera anicteric Neck: No masses, no thyromegaly Breasts: symmetric; pendulous; no skin changes; no axilary lymphadenopathy; Right breast - no palpable masses Left breast 1:00 about 9 cm from the nipple - indistinct irregular firmness of the breast tissue; no discrete mass Lungs: CTA bilaterally; normal respiratory effort CV: Regular rate and rhythm; no murmurs Abd: +bowel sounds, soft, non-tender, no masses Ext: Well-perfused; no edema Skin: Warm, dry; no sign of jaundice    Assessment & Plan    BREAST MASS, LEFT (N63)  Current Plans Schedule for Surgery - Left radioactive seed localized lumpectomy. The surgical procedure has been discussed with the patient. Potential risks, benefits, alternative treatments, and expected outcomes have been explained. All of the patient's questions at this time have been answered. The likelihood of reaching the patient's treatment goal is good. The patient understand the proposed surgical procedure and wishes to proceed   Imogene Burn. Georgette Dover, MD, [facility]  General/ Trauma Surgery  09/21/2015 7:19 AM

## 2015-09-21 NOTE — Anesthesia Procedure Notes (Signed)
Procedure Name: Intubation Date/Time: 09/21/2015 12:50 PM Performed by: Garrison Columbus T Pre-anesthesia Checklist: Patient identified, Emergency Drugs available, Suction available and Patient being monitored Patient Re-evaluated:Patient Re-evaluated prior to inductionOxygen Delivery Method: Circle System Utilized Preoxygenation: Pre-oxygenation with 100% oxygen Intubation Type: IV induction Ventilation: Mask ventilation without difficulty and Oral airway inserted - appropriate to patient size Laryngoscope Size: Mac and 3 Grade View: Grade II Tube type: Oral Number of attempts: 1 Airway Equipment and Method: Stylet and Oral airway Placement Confirmation: ETT inserted through vocal cords under direct vision,  positive ETCO2 and breath sounds checked- equal and bilateral Secured at: 23 cm Tube secured with: Tape Dental Injury: Teeth and Oropharynx as per pre-operative assessment  Comments: Intubation by Dr. Jillyn Hidden

## 2015-09-21 NOTE — Transfer of Care (Signed)
Immediate Anesthesia Transfer of Care Note  Patient: Tracy Rose  Procedure(s) Performed: Procedure(s): LEFT BREAST LUMPECTOMY WITH RADIOACTIVE SEED LOCALIZATION (Left)  Patient Location: PACU  Anesthesia Type:General  Level of Consciousness: awake, alert  and oriented  Airway & Oxygen Therapy: Patient Spontanous Breathing  Post-op Assessment: Report given to RN, Post -op Vital signs reviewed and stable and Patient moving all extremities X 4  Post vital signs: Reviewed and stable  Last Vitals:  Vitals:   09/21/15 0734  BP: (!) 141/108  Pulse: 78  Resp: 20  Temp: 36.4 C    Last Pain:  Vitals:   09/21/15 0734  TempSrc: Oral         Complications: No apparent anesthesia complications

## 2015-09-21 NOTE — Discharge Instructions (Signed)
[facility] °Office Phone Number [phone] ° °BREAST BIOPSY/ PARTIAL MASTECTOMY: POST OP INSTRUCTIONS ° °Always review your discharge instruction sheet given to you by the facility where your surgery was performed. ° °IF YOU HAVE DISABILITY OR FAMILY LEAVE FORMS, YOU MUST BRING THEM TO THE OFFICE FOR PROCESSING.  DO NOT GIVE THEM TO YOUR DOCTOR. ° °1. A prescription for pain medication may be given to you upon discharge.  Take your pain medication as prescribed, if needed.  If narcotic pain medicine is not needed, then you may take acetaminophen (Tylenol) or ibuprofen (Advil) as needed. °2. Take your usually prescribed medications unless otherwise directed °3. If you need a refill on your pain medication, please contact your pharmacy.  They will contact our office to request authorization.  Prescriptions will not be filled after 5pm or on week-ends. °4. You should eat very light the first 24 hours after surgery, such as soup, crackers, pudding, etc.  Resume your normal diet the day after surgery. °5. Most patients will experience some swelling and bruising in the breast.  Ice packs and a good support bra will help.  Swelling and bruising can take several days to resolve.  °6. It is common to experience some constipation if taking pain medication after surgery.  Increasing fluid intake and taking a stool softener will usually help or prevent this problem from occurring.  A mild laxative (Milk of Magnesia or Miralax) should be taken according to package directions if there are no bowel movements after 48 hours. °7. Unless discharge instructions indicate otherwise, you may remove your bandages 48 hours after surgery, and you may shower at that time.  You will have steri-strips (small skin tapes) in place directly over the incision.  These strips should be left on the skin for 7-10 days.   Any sutures or staples will be removed at the office during your follow-up visit. °8. ACTIVITIES:  You may resume  regular daily activities (gradually increasing) beginning the next day.  Wearing a good support bra or sports bra minimizes pain and swelling.  You may have sexual intercourse when it is comfortable. °a. You may drive when you no longer are taking prescription pain medication, you can comfortably wear a seatbelt, and you can safely maneuver your car and apply brakes. °b. RETURN TO WORK:  1-2 weeks °9. You should see your doctor in the office for a follow-up appointment approximately two weeks after your surgery.  Your doctor’s nurse will typically make your follow-up appointment when she calls you with your pathology report.  Expect your pathology report 2-3 business days after your surgery.  You may call to check if you do not hear from us after three days. °10. OTHER INSTRUCTIONS: _______________________________________________________________________________________________ _____________________________________________________________________________________________________________________________________ °_____________________________________________________________________________________________________________________________________ °_____________________________________________________________________________________________________________________________________ ° °WHEN TO CALL YOUR DOCTOR: °1. Fever over 101.0 °2. Nausea and/or vomiting. °3. Extreme swelling or bruising. °4. Continued bleeding from incision. °5. Increased pain, redness, or drainage from the incision. ° °The clinic staff is available to answer your questions during regular business hours.  Please don’t hesitate to call and ask to speak to one of the nurses for clinical concerns.  If you have a medical emergency, go to the nearest emergency room or call 911.  A surgeon from [facility] is always on call at the hospital. ° °For further questions, please visit [email or web address]  ° ° °

## 2015-09-21 NOTE — Progress Notes (Addendum)
Patient had "3 gulps of cranberry juice & 3 bites of pizza" this am around 5:15am She also has no one to stay with her tonight for the first 24 hrs. Have relayed info on to Drs. Tsuei and Judd. Surgery will be postponed until 11 am.  Patient is aware and understands.  Pre meds to be given closer to surgery time.

## 2015-09-21 NOTE — Anesthesia Preprocedure Evaluation (Addendum)
Anesthesia Evaluation  Patient identified by MRN, date of birth, ID band Patient awake    Reviewed: Allergy & Precautions, NPO status , Patient's Chart, lab work & pertinent test results  Airway Mallampati: II  TM Distance: >3 FB Neck ROM: Full    Dental  (+) Dental Advisory Given, Teeth Intact   Pulmonary Current Smoker,    Pulmonary exam normal breath sounds clear to auscultation       Cardiovascular hypertension, + DVT   Rhythm:Regular Rate:Normal     Neuro/Psych    GI/Hepatic   Endo/Other  Morbid obesity  Renal/GU      Musculoskeletal   Abdominal   Peds  Hematology   Anesthesia Other Findings   Reproductive/Obstetrics                          Anesthesia Physical Anesthesia Plan  ASA: III  Anesthesia Plan: General   Post-op Pain Management:    Induction: Intravenous  Airway Management Planned: Oral ETT  Additional Equipment:   Intra-op Plan:   Post-operative Plan: Extubation in OR  Informed Consent: I have reviewed the patients History and Physical, chart, labs and discussed the procedure including the risks, benefits and alternatives for the proposed anesthesia with the patient or authorized representative who has indicated his/her understanding and acceptance.   Dental advisory given  Plan Discussed with: CRNA, Anesthesiologist and Surgeon  Anesthesia Plan Comments: (Ms. Hougen had 3 bites of pizza this morning at 5am.. To be NPO appropriate her case was delayed 6-8 hrs, closer to 8 hours )      Anesthesia Quick Evaluation

## 2015-09-21 NOTE — Op Note (Signed)
Preop diagnosis: Left breast complex sclerosing lesion Postop diagnosis: Same Procedure performed: Left radioactive seed localized lumpectomy Surgeon:Ruble Pumphrey K. Asst.: None Anesthesia: Gen. via LMA Indications: This is a 50 year old female who underwent bilateral mammogram on 06/08/15. Previously she had not had a mammogram in the last 10 years. On the left side at 1:00 she had indeterminate irregular mass. This was located 9 cm from the nipple measuring 7 mm. Ultrasound was otherwise unremarkable. Core biopsy performed under tunnel symphysis guidance showed a complex sclerosing lesion with microcalcifications. She was referred for lumpectomy. A radioactive seed was placed by radiology yesterday. We confirmed the presence of the seed in the preoperative holding area with the neoprobe device.  Description of procedure: The patient brought to the operating room placed in a supine position on the operating room table. After an adequate level of general anesthesia was obtained, she was tilted towards her right side. Her breast are fairly pendulous and the left breast tends to slide towards the axilla. We taped the medial part of her breast to chest wall to prevent this migration towards the axilla. We prepped the lateral half of her breast with ChloraPrep and draped in sterile fashion. A timeout was taken to ensure the proper patient and proper procedure. We identified the area of activity over the seed with the neoprobe device. This is in the upper outer quadrant 1:00. I made a circumareolar incision in the upper outer quadrant after infiltrating with 0.25% Marcaine with epinephrine. We dissected down into the breast tissue towards the upper outer quadrant. We used the neoprobe for guidance. We used a lighted retractors for exposure. Once we were directly over the area of greatest activity we began raising 4 margins around this area. At this point we experience some technical difficulty with the neoprobe.  We had to switch to a different neoprobe to accurately localize the seed. We were (4 margins around the area of activity. We did the specimen and oriented with a paint kit. Specimen mammogram confirmed the presence of the seed and the biopsy clip within the center of the specimen. This was sent for pathologic examination. We irrigated the wound thoroughly and inspected for hemostasis. The wound was closed with a deep layer of 3-0 Vicryl and a subcuticular layer of 4-0 Monocryl. Steri-Strips and clean dressings were applied. The patient was then extubated and brought to the recovery room in stable condition. All sponge, instrument, and needle counts are correct.  Imogene Burn. Georgette Dover, MD, [facility]  General/ Trauma Surgery  09/21/2015 2:18 PM

## 2015-09-22 ENCOUNTER — Encounter (HOSPITAL_COMMUNITY): Payer: Self-pay | Admitting: Surgery

## 2015-09-22 NOTE — Anesthesia Postprocedure Evaluation (Signed)
Anesthesia Post Note  Patient: Tracy Rose  Procedure(s) Performed: Procedure(s) (LRB): LEFT BREAST LUMPECTOMY WITH RADIOACTIVE SEED LOCALIZATION (Left)  Patient location during evaluation: PACU Anesthesia Type: General Level of consciousness: awake and alert Pain management: pain level controlled Vital Signs Assessment: post-procedure vital signs reviewed and stable Respiratory status: spontaneous breathing, nonlabored ventilation, respiratory function stable and patient connected to nasal cannula oxygen Cardiovascular status: blood pressure returned to baseline and stable Postop Assessment: no signs of nausea or vomiting Anesthetic complications: no    Last Vitals:  Vitals:   09/21/15 1435 09/21/15 1455  BP: (!) 132/96   Pulse: 87 88  Resp: (!) 23 14  Temp:  36.6 C    Last Pain:  Vitals:   09/21/15 0734  TempSrc: Oral                 Brynnan Rodenbaugh,JAMES TERRILL

## 2015-09-29 ENCOUNTER — Other Ambulatory Visit: Payer: Self-pay | Admitting: Neurology

## 2015-10-14 ENCOUNTER — Emergency Department (HOSPITAL_COMMUNITY)
Admission: EM | Admit: 2015-10-14 | Discharge: 2015-10-14 | Discharge status: Against Medical Advice | Payer: Medicaid Other | Attending: Emergency Medicine | Admitting: Emergency Medicine

## 2015-10-14 ENCOUNTER — Encounter (HOSPITAL_COMMUNITY): Payer: Self-pay | Admitting: Emergency Medicine

## 2015-10-14 DIAGNOSIS — F909 Attention-deficit hyperactivity disorder, unspecified type: Secondary | ICD-10-CM | POA: Diagnosis not present

## 2015-10-14 DIAGNOSIS — J111 Influenza due to unidentified influenza virus with other respiratory manifestations: Secondary | ICD-10-CM | POA: Diagnosis present

## 2015-10-14 DIAGNOSIS — F17210 Nicotine dependence, cigarettes, uncomplicated: Secondary | ICD-10-CM | POA: Diagnosis not present

## 2015-10-14 DIAGNOSIS — J069 Acute upper respiratory infection, unspecified: Secondary | ICD-10-CM | POA: Insufficient documentation

## 2015-10-14 DIAGNOSIS — Z7982 Long term (current) use of aspirin: Secondary | ICD-10-CM | POA: Diagnosis not present

## 2015-10-14 DIAGNOSIS — I10 Essential (primary) hypertension: Secondary | ICD-10-CM | POA: Insufficient documentation

## 2015-10-14 MED ORDER — ACETAMINOPHEN 325 MG PO TABS
ORAL_TABLET | ORAL | Status: AC
  Filled 2015-10-14: qty 2

## 2015-10-14 MED ORDER — ACETAMINOPHEN 325 MG PO TABS
650.0000 mg | ORAL_TABLET | Freq: Once | ORAL | Status: AC | PRN
  Administered 2015-10-14: 650 mg via ORAL

## 2015-10-14 NOTE — ED Provider Notes (Signed)
[facility] DEPT Provider Note   CSN: [id number] Arrival date & time: 10/14/15  1626  By signing my name below, I, Tracy Rose, attest that this documentation has been prepared under the direction and in the presence of Etta Quill NP.  Electronically Signed: Ephriam Rose, ED Scribe. 10/14/15. 6:32 PM.   History   Chief Complaint Chief Complaint  Patient presents with  . Influenza    HPI HPI Comments: Tracy Rose is a 50 y.o. female with a Hx of HTN, who presents to the Emergency Department complaining of nasal congestion, sore throat, subjective fever and chills that started this morning. Pt states that she went to work this morning and symptoms started shortly after. These symptoms have gradually worsened throughout the day today. She also complains of dysuria that started this morning, denies any discharge. She denies any known sick contacts.    The history is provided by the patient. No language interpreter was used.    Past Medical History:  Diagnosis Date  . Anxiety state   . Essential hypertension, benign 06/10/2015  . Genital herpes 12/12/2011  . GOITER, MULTINODULAR 09/05/2007   Dr. Zada Girt [facility]in[facility]  [place] 2011: Stable to slightly smaller bilateral thyroid nodules since 09/04/2007. No new or enlarging thyroid nodules identified. Biopsy 08/2007: non neoplastic goiter   . Mild depression 12/15/2013  . OBESITY, NOS 03/21/2006       . Tobacco abuse 09/08/2010    Patient Active Problem List   Diagnosis Date Noted  . MDD (major depressive disorder), recurrent, severe, with psychosis ([place]) 08/06/2015  . Benign essential hypertension antepartum   . Insomnia   . Anxiety state   . Severe manic bipolar 1 disorder with psychotic behavior ([place]) 08/05/2015  . Attention deficit hyperactivity disorder (ADHD) 06/15/2015  . Anxiety and depression 06/14/2015  . Submandibular swelling 06/14/2015  . Breast mass 06/10/2015  . Chest pain 06/10/2015  . Essential  hypertension, benign 06/10/2015  . Lymphadenopathy, inguinal 02/09/2015  . ADHD (attention deficit hyperactivity disorder) 08/15/2014  . Mild depression 12/15/2013  . Macromastia 04/07/2013  . Genital herpes 12/12/2011  . Tobacco abuse 09/08/2010  . GOITER, MULTINODULAR 09/05/2007  . OBESITY, NOS 03/21/2006    Past Surgical History:  Procedure Laterality Date  . BREAST LUMPECTOMY WITH RADIOACTIVE SEED LOCALIZATION Left 09/21/2015   Procedure: LEFT BREAST LUMPECTOMY WITH RADIOACTIVE SEED LOCALIZATION;  Surgeon: Donnie Mesa, MD;  Location: [facility];  Service: General;  Laterality: Left;  . RADIOLOGY WITH ANESTHESIA N/A 01/26/2014   Procedure: MRA NECK, MRI BRAIN WITH AND WITHOUT CONTRAST ;  Surgeon: Medication Radiologist, MD;  Location: [facility];  Service: Radiology;  Laterality: N/A;  . WISDOM TOOTH EXTRACTION     @ age 22    OB History    Gravida Para Term Preterm AB Living   4 2 2  0 2 1   SAB TAB Ectopic Multiple Live Births   0 2 0 0         Home Medications    Prior to Admission medications   Medication Sig Start Date End Date Taking? Authorizing Provider  aspirin 81 MG chewable tablet Chew 81 mg by mouth daily as needed for mild pain.    Historical Provider, MD  buPROPion (WELLBUTRIN XL) 150 MG 24 hr tablet Take 1 tablet (150 mg total) by mouth daily. For depression Patient taking differently: Take 75 mg by mouth daily. For depression 08/09/15   Encarnacion Slates, NP  cholecalciferol (VITAMIN D) 1000 units tablet Take 2,000 Units  by mouth daily.    Historical Provider, MD  Cyanocobalamin (VITAMIN B-12) 5000 MCG TBDP Take 10,000 mcg by mouth daily.    Historical Provider, MD  hydrochlorothiazide (HYDRODIURIL) 12.5 MG tablet Take 1 tablet (12.5 mg total) by mouth daily. For high blood pressure 08/09/15   Encarnacion Slates, NP  naproxen (NAPROSYN) 500 MG tablet Take 1 tablet (500 mg total) by mouth 2 (two) times daily. For pain managment Patient taking differently: Take 500 mg by mouth 2  (two) times daily as needed for mild pain. For pain managment 08/09/15   Encarnacion Slates, NP  oxyCODONE-acetaminophen (PERCOCET/ROXICET) 5-325 MG tablet Take 1 tablet by mouth every 4 (four) hours as needed for severe pain. 09/21/15   Donnie Mesa, MD  sertraline (ZOLOFT) 50 MG tablet Take 1 tablet (50 mg total) by mouth daily. For depression 08/09/15   Encarnacion Slates, NP  traZODone (DESYREL) 50 MG tablet Take 1 tablet (50 mg total) by mouth at bedtime as needed for sleep. 08/09/15   Encarnacion Slates, NP  venlafaxine XR (EFFEXOR-XR) 75 MG 24 hr capsule Take 75 mg by mouth 2 (two) times daily. 07/27/15   Historical Provider, MD    Family History Family History  Problem Relation Age of Onset  . Arthritis Mother   . Diabetes Father   . Arthritis Father   . Hypertension Father   . Hyperlipidemia Father   . Heart disease Father   . Stroke Father   . Cancer Maternal Grandfather   . Cancer Sister   . Migraines Neg Hx     Social History Social History  Substance Use Topics  . Smoking status: Current Every Day Smoker    Packs/day: 0.20    Years: 25.00    Types: Cigarettes  . Smokeless tobacco: Former Systems developer    Quit date: 10/28/2010  . Alcohol use No     Allergies   No known allergies   Review of Systems Review of Systems  Constitutional: Positive for chills.  HENT: Positive for congestion and sore throat.   Gastrointestinal: Negative for abdominal pain.  Genitourinary: Positive for dysuria. Negative for vaginal discharge.  All other systems reviewed and are negative.    Physical Exam Updated Vital Signs BP (!) 170/119 (BP Location: Right Arm)   Pulse 104   Temp 100 F (37.8 C) (Oral)   Resp 20   LMP 08/30/2015   SpO2 100%   Physical Exam  Constitutional: She is oriented to person, place, and time. She appears well-developed and well-nourished. No distress.  HENT:  Head: Normocephalic and atraumatic.  Right Ear: External ear normal.  Left Ear: External ear normal.  Oropharynx  mildly erythematous. TM's normal bilaterally  Eyes: Conjunctivae are normal.  Neck: Normal range of motion.  Mild cervical lymphadenopathy  Cardiovascular: Normal rate.   Pulmonary/Chest: Effort normal.  Mild rhonchi  Abdominal: Soft.  Lymphadenopathy:    She has cervical adenopathy.  Neurological: She is alert and oriented to person, place, and time.  Skin: Skin is warm and dry. She is not diaphoretic.  Psychiatric: She has a normal mood and affect. Judgment normal.  Nursing note and vitals reviewed.    ED Treatments / Results  DIAGNOSTIC STUDIES: Oxygen Saturation is 100% on RA, normal by my interpretation.  COORDINATION OF CARE: 6:31 PM-Will order chest xray and Tylenol. Discussed treatment plan with pt at bedside and pt agreed to plan.   Labs (all labs ordered are listed, but only abnormal results are displayed) Labs  Reviewed - No data to display  EKG  EKG Interpretation None       Radiology No results found.  Procedures Procedures (including critical care time)  Medications Ordered in ED Medications  acetaminophen (TYLENOL) 325 MG tablet (not administered)  acetaminophen (TYLENOL) tablet 650 mg (650 mg Oral Given 10/14/15 1644)     Initial Impression / Assessment and Plan / ED Course  I have reviewed the triage vital signs and the nursing notes.  Pertinent labs & imaging results that were available during my care of the patient were reviewed by me and considered in my medical decision making (see chart for details).  Clinical Course  Patient apparently left after being evaluated and diagnostic testing ordered.     Final Clinical Impressions(s) / ED Diagnoses   Final diagnoses:  None    New Prescriptions Discharge Medication List as of 10/14/2015  7:56 PM    I personally performed the services described in this documentation, which was scribed in my presence. The recorded information has been reviewed and is accurate.    Etta Quill,  NP 10/14/15 [id number]    Elnora Morrison, MD 10/16/15 [phone]

## 2015-10-14 NOTE — ED Notes (Signed)
X-ray tech here to pick pt up for x-ray.  Pt not in room

## 2015-10-14 NOTE — ED Triage Notes (Signed)
Pt here for flu like illness with fever and body aches; nasal congestion starting today

## 2015-10-28 ENCOUNTER — Encounter: Payer: Self-pay | Admitting: Family Medicine

## 2015-10-28 ENCOUNTER — Ambulatory Visit (INDEPENDENT_AMBULATORY_CARE_PROVIDER_SITE_OTHER): Payer: Medicaid Other | Admitting: Family Medicine

## 2015-10-28 DIAGNOSIS — F333 Major depressive disorder, recurrent, severe with psychotic symptoms: Secondary | ICD-10-CM

## 2015-10-28 DIAGNOSIS — R05 Cough: Secondary | ICD-10-CM | POA: Diagnosis not present

## 2015-10-28 DIAGNOSIS — R059 Cough, unspecified: Secondary | ICD-10-CM

## 2015-10-28 DIAGNOSIS — N6320 Unspecified lump in the left breast, unspecified quadrant: Secondary | ICD-10-CM | POA: Diagnosis not present

## 2015-10-28 DIAGNOSIS — F17200 Nicotine dependence, unspecified, uncomplicated: Secondary | ICD-10-CM | POA: Diagnosis not present

## 2015-10-28 DIAGNOSIS — A6000 Herpesviral infection of urogenital system, unspecified: Secondary | ICD-10-CM

## 2015-10-28 DIAGNOSIS — N62 Hypertrophy of breast: Secondary | ICD-10-CM

## 2015-10-28 MED ORDER — BUPROPION HCL ER (XL) 150 MG PO TB24: 150.0000 mg | ORAL_TABLET | Freq: Every day | ORAL | 0 refills | Status: DC

## 2015-10-28 MED ORDER — HYDROCHLOROTHIAZIDE 12.5 MG PO TABS: 12.5000 mg | ORAL_TABLET | Freq: Every day | ORAL | 1 refills | Status: DC

## 2015-10-28 MED ORDER — SERTRALINE HCL 50 MG PO TABS: 50.0000 mg | ORAL_TABLET | Freq: Every day | ORAL | 0 refills | Status: DC

## 2015-10-28 MED ORDER — TRAZODONE HCL 50 MG PO TABS: 50.0000 mg | ORAL_TABLET | Freq: Every evening | ORAL | 0 refills | Status: DC | PRN

## 2015-10-28 NOTE — Assessment & Plan Note (Signed)
Breast weight on her shoulder and back likely the cause of her pain. She will benefit from breast reduction surgery. Will refer to surgery for this as well.

## 2015-10-28 NOTE — Progress Notes (Signed)
Subjective:     Patient ID: Tracy Rose, female   DOB: September 01, 1965, 50 y.o.   MRN: [id number]  HPI  Breast pain:Patient c/o B/L breast pain worse on the left. She is more concerned about her left breast pain which started since she had surgical excision of breast lump on Aug 30. She stated she can feel a new lump underneath her breast since she had the surgery. She denies change in breast skin color or texture. She denies nipple discharge, her breast pain is about 7/10 in severity. Feels like a very bad tooth ache.  She uses ice pack to help with the pain. She need referral back to the surg.She also endorsed that her large breast is causing major back and shoulder discomfort. This has been on going for years. Depression:She stated she was recently discharged in the hospital. She was sent home on bunch of medication and advised to stop Effexor. She stated she is out of her other medication and she is taking her Effexor which also help with her hot flashes symptoms against her psychiatrist's recommendation. She is has an up coming psych appointment on Oct 23rd. Cough: C/O chest cold for few days associated with non-productive cough, she denies fever, no chest pain or SOB. Not taking any meds for this. She is currently a smoker and she would like to quit. Genital herpes: She stated she has been having breakouts. She was advised to d/c Acyclovir after hospitalization for mental health condition. However she has been taking it anyway. HM: Flu shot needed. She stated she applied for disability and she has an up coming appointment with the DSS.  Current Outpatient Prescriptions on File Prior to Visit  Medication Sig Dispense Refill  . hydrochlorothiazide (HYDRODIURIL) 12.5 MG tablet Take 1 tablet (12.5 mg total) by mouth daily. For high blood pressure 30 tablet 0  . naproxen (NAPROSYN) 500 MG tablet Take 1 tablet (500 mg total) by mouth 2 (two) times daily. For pain managment (Patient taking differently:  Take 500 mg by mouth 2 (two) times daily as needed for mild pain. For pain managment) 30 tablet 0  . sertraline (ZOLOFT) 50 MG tablet Take 1 tablet (50 mg total) by mouth daily. For depression 30 tablet 0  . venlafaxine XR (EFFEXOR-XR) 75 MG 24 hr capsule Take 75 mg by mouth 2 (two) times daily.  11  . aspirin 81 MG chewable tablet Chew 81 mg by mouth daily as needed for mild pain.    Marland Kitchen buPROPion (WELLBUTRIN XL) 150 MG 24 hr tablet Take 1 tablet (150 mg total) by mouth daily. For depression (Patient not taking: Reported on 10/28/2015) 30 tablet 0  . cholecalciferol (VITAMIN D) 1000 units tablet Take 2,000 Units by mouth daily.    . Cyanocobalamin (VITAMIN B-12) 5000 MCG TBDP Take 10,000 mcg by mouth daily.    . traZODone (DESYREL) 50 MG tablet Take 1 tablet (50 mg total) by mouth at bedtime as needed for sleep. (Patient not taking: Reported on 10/28/2015) 30 tablet 0   No current facility-administered medications on file prior to visit.    Past Medical History:  Diagnosis Date  . Anxiety state   . Essential hypertension, benign 06/10/2015  . Genital herpes 12/12/2011  . GOITER, MULTINODULAR 09/05/2007   Dr. Zada Girt Endocrin[facility]  [place] 2011: Stable to slightly smaller bilateral thyroid nodules since 09/04/2007. No new or enlarging thyroid nodules identified. Biopsy 08/2007: non neoplastic goiter   . Mild depression ([place]) 12/15/2013  . OBESITY, NOS  03/21/2006       . Tobacco abuse 09/08/2010  . Tobacco abuse    Vitals:   10/28/15 1007 10/28/15 1028  BP: (!) 149/92 122/86  Pulse: (!) 116   Temp: 98.3 F (36.8 C)   TempSrc: Oral   Weight: 247 lb (112 kg)   Height: 5\' 6"  (1.676 m)      Review of Systems  Respiratory: Positive for cough. Negative for wheezing.   Cardiovascular: Negative.   Gastrointestinal: Negative.   Genitourinary: Negative.   Musculoskeletal:       Breast pain and knot in her left breast  All other systems reviewed and are negative.      Objective:    Physical Exam  Constitutional: She appears well-developed. No distress.  Cardiovascular: Normal rate, regular rhythm and normal heart sounds.   No murmur heard. Pulmonary/Chest: Effort normal and breath sounds normal. No respiratory distress. She has no wheezes. Right breast exhibits no inverted nipple, no mass, no nipple discharge, no skin change and no tenderness. Left breast exhibits no inverted nipple, no nipple discharge and no tenderness. Breasts are symmetrical.    Abdominal: Soft. Bowel sounds are normal. She exhibits no distension and no mass. There is no tenderness.  Musculoskeletal: Normal range of motion.  Neurological: She is alert. No cranial nerve deficit.  Nursing note and vitals reviewed.      Assessment:     Breast pain Depression Cough Smoking Genital Herpes Health maintenance    Plan:     Check problem list.  HM: Flu shot offered but she declined. For her disability application, I advised if there is any paper work I need to sign, she can submit it for review. She verbalized understanding.

## 2015-10-28 NOTE — Addendum Note (Signed)
Addended by: Andrena Mews T on: 10/28/2015 03:43 PM   Modules accepted: Orders

## 2015-10-28 NOTE — Patient Instructions (Signed)
It was nice seeing you today. I will do your referral to surgery. Please take your medication as instructed by the Psychiatrist during the last admission. You were instructed to stop Effexor, please follow through instruction and schedule follow up with Psych as instructed. I will refill your meds, pending follow up with Psych.

## 2015-10-28 NOTE — Assessment & Plan Note (Signed)
S/P surgical excision of her left breast complex sclerosing lesion. Likely a scaring tissue at the surgical site associated with mild pain. I advised follow up with surgery for reassessment. Referral placed. May use Tylenol as needed for pain. She agreed with plan.

## 2015-10-28 NOTE — Assessment & Plan Note (Signed)
Likely viral illness. Pulmonary exam benign. May use OTC meds as needed. Return precaution discussed.

## 2015-10-28 NOTE — Assessment & Plan Note (Addendum)
PQH9 done today with score of 21. She is currently non-suicidal or homicidal. Patient is apparently not taking her meds as instructed. I again reviewed and discussed her discharge summary instruction with her by Dr. Neita Garnet on July 18,2017. She is to stop Effexor and continue other meds. Med refilled pending follow up appointment this month with Neuropsychiatry associate. Return precaution discussed. She is also advised to follow up with her addiction clinic treatment plan. She verbalized understanding.

## 2015-10-28 NOTE — Assessment & Plan Note (Signed)
She is motivated to quit. Appointment made with Dr. Valentina Lucks for treatment and counseling. F/U with me as needed.

## 2015-10-28 NOTE — Assessment & Plan Note (Signed)
I reviewed her d/c from last behavioral health admission. Indeed her Aclovir was discontinued. I advised her that Acyclovir can cause hallucination and Psychosis. This is likely why it was d/c. I advised her to establish care with psychiatry, once she is stable and they clear her, she can be started back on Acyclovir.  She verbalized understanding and agreed with plan. In the mean time, genital hygiene discussed with conservative measures.

## 2015-10-31 ENCOUNTER — Ambulatory Visit: Payer: Self-pay | Admitting: Pharmacist

## 2015-12-14 ENCOUNTER — Ambulatory Visit: Payer: Medicaid Other | Admitting: Neurology

## 2016-02-07 ENCOUNTER — Encounter: Payer: Self-pay | Admitting: *Deleted

## 2016-02-07 ENCOUNTER — Ambulatory Visit (INDEPENDENT_AMBULATORY_CARE_PROVIDER_SITE_OTHER): Payer: Medicaid Other | Admitting: Family Medicine

## 2016-02-07 ENCOUNTER — Encounter: Payer: Self-pay | Admitting: Family Medicine

## 2016-02-07 VITALS — BP 145/110 | HR 79 | Temp 97.5°F | Ht 66.0 in | Wt 261.0 lb

## 2016-02-07 DIAGNOSIS — F17200 Nicotine dependence, unspecified, uncomplicated: Secondary | ICD-10-CM

## 2016-02-07 DIAGNOSIS — N6320 Unspecified lump in the left breast, unspecified quadrant: Secondary | ICD-10-CM

## 2016-02-07 DIAGNOSIS — I10 Essential (primary) hypertension: Secondary | ICD-10-CM

## 2016-02-07 DIAGNOSIS — N644 Mastodynia: Secondary | ICD-10-CM

## 2016-02-07 MED ORDER — HYDROCHLOROTHIAZIDE 25 MG PO TABS: 25.0000 mg | ORAL_TABLET | Freq: Every day | ORAL | 1 refills | Status: DC

## 2016-02-07 NOTE — Patient Instructions (Signed)
It was nice to see you today. Your BP is still running high. Please start HCTZ 25 mg daily instead of 12.5 mg daily. I have sent to your pharmacy a new prescription.  If you will like to complete your previous prescription of 12.5 mg daily, please take two of those

## 2016-02-07 NOTE — Assessment & Plan Note (Signed)
Not well controlled especially the diastolic. Repeat BP check done by me remained elevated. As discussed with her, she is to go up on HCTZ from 12.5 mg qd to 25 mg qd. Continue home BP monitoring. I escribed her new dose. F/U in 1-2 weeks for reassessment.

## 2016-02-07 NOTE — Assessment & Plan Note (Signed)
Breast exam benign. U/S and diagnostic mammogram ordered to evaluate given recent surgery for breast sclerotic lesion. As discussed with her, she will need follow up with Surgery at some point. I will defer till after she gets mammogram done. Tylenol as needed for pain. F/U as needed.

## 2016-02-07 NOTE — Assessment & Plan Note (Signed)
She asked if she can be on Chantix while on Wellbutrin. She is on depression dose of Wellbutrin As discussed with her, I will advised she sees Dr. Valentina Lucks for treatment option given her current regimen. She agreed with plan. Appointment made.

## 2016-02-07 NOTE — Progress Notes (Signed)
Subjective:     Patient ID: Tracy Rose, female   DOB: Jun 12, 1965, 51 y.o.   MRN: [id number]  HPI HTN:BP running high despite compliant with meds. Denies any other concern. Here for check up. Breast pain: C/O worsening left breast pain following breast surgery. Denies any other concern. Smoking: She is trying to quit tobacco smoking with difficulty. She is requesting pharmacologic treatment. Denies any other concern.  Current Outpatient Prescriptions on File Prior to Visit  Medication Sig Dispense Refill  . aspirin 81 MG chewable tablet Chew 81 mg by mouth daily as needed for mild pain.    Marland Kitchen buPROPion (WELLBUTRIN XL) 150 MG 24 hr tablet Take 1 tablet (150 mg total) by mouth daily. For depression 30 tablet 0  . cholecalciferol (VITAMIN D) 1000 units tablet Take 2,000 Units by mouth daily.    . Cyanocobalamin (VITAMIN B-12) 5000 MCG TBDP Take 10,000 mcg by mouth daily.    . hydrochlorothiazide (HYDRODIURIL) 12.5 MG tablet Take 1 tablet (12.5 mg total) by mouth daily. For high blood pressure 90 tablet 1  . naproxen (NAPROSYN) 500 MG tablet Take 1 tablet (500 mg total) by mouth 2 (two) times daily. For pain managment (Patient taking differently: Take 500 mg by mouth 2 (two) times daily as needed for mild pain. For pain managment) 30 tablet 0  . sertraline (ZOLOFT) 50 MG tablet Take 1 tablet (50 mg total) by mouth daily. For depression 30 tablet 0  . traZODone (DESYREL) 50 MG tablet Take 1 tablet (50 mg total) by mouth at bedtime as needed for sleep. 30 tablet 0   No current facility-administered medications on file prior to visit.    Past Medical History:  Diagnosis Date  . Anxiety state   . Essential hypertension, benign 06/10/2015  . Genital herpes 12/12/2011  . GOITER, MULTINODULAR 09/05/2007   Dr. Zada Girt Endocrinology  [place] 2011: Stable to slightly smaller bilateral thyroid nodules since 09/04/2007. No new or enlarging thyroid nodules identified. Biopsy 08/2007: non neoplastic  goiter   . Mild depression ([place]) 12/15/2013  . OBESITY, NOS 03/21/2006       . Tobacco abuse 09/08/2010  . Tobacco abuse      Review of Systems  Respiratory: Negative.   Cardiovascular: Negative.   Gastrointestinal: Negative.   Musculoskeletal:       Breast pain  Neurological: Negative for headaches.  All other systems reviewed and are negative.     Vitals:   02/07/16 0948 02/07/16 1014  BP: (!) 142/100 (!) 145/110  Pulse: 79   Temp: 97.5 F (36.4 C)   TempSrc: Oral   SpO2: 99%   Weight: 261 lb (118.4 kg)   Height: 5\' 6"  (1.676 m)     Objective:   Physical Exam  Constitutional: She appears well-developed. No distress.  Cardiovascular: Normal rate, regular rhythm and normal heart sounds.   No murmur heard. Pulmonary/Chest: Effort normal and breath sounds normal. No respiratory distress. She has no wheezes.    Abdominal: Soft. Bowel sounds are normal. She exhibits no distension and no mass.  Musculoskeletal: Normal range of motion. She exhibits no edema.  Nursing note and vitals reviewed.      Assessment:     HTN: Uncontrolled Left breast pain Smoking cessation    Plan:     Check problem list.

## 2016-02-09 ENCOUNTER — Ambulatory Visit: Payer: Self-pay | Admitting: Pharmacist

## 2016-02-14 ENCOUNTER — Other Ambulatory Visit: Payer: Self-pay

## 2016-02-16 ENCOUNTER — Encounter: Payer: Self-pay | Admitting: Family Medicine

## 2016-02-16 ENCOUNTER — Ambulatory Visit (INDEPENDENT_AMBULATORY_CARE_PROVIDER_SITE_OTHER): Payer: Medicaid Other | Admitting: *Deleted

## 2016-02-16 VITALS — BP 155/90 | HR 92 | Temp 98.3°F

## 2016-02-16 DIAGNOSIS — I10 Essential (primary) hypertension: Secondary | ICD-10-CM

## 2016-02-16 NOTE — Progress Notes (Deleted)
   Subjective:    Patient ID: Tracy Rose , female   DOB: 04/12/1965 , 51 y.o..   MRN: [id number]  HPI  Tracy Rose is here for a same day visit for No chief complaint on file.  Hypertension Patient was last seen on 02/07/16, at that time her HCTZ was increased from 12.5 mg qd to 25 mg qd. Blood pressure at home: *** Exercise: *** Low salt diet: *** Medications: Compliant with *** Side effects: *** ROS: Denies headache, dizziness, visual changes, nausea, vomiting, chest pain, abdominal pain or shortness of breath. BP Readings from Last 3 Encounters:  02/16/16 (!) 155/90  02/07/16 (!) 145/110  10/28/15 122/86    Review of Systems: Per HPI. All other systems reviewed and are negative.   Past Medical History: Patient Active Problem List   Diagnosis Date Noted  . Breast mass, left 10/28/2015  . Smoking 10/28/2015  . MDD (major depressive disorder), recurrent, severe, with psychosis ([place]) 08/06/2015  . Insomnia   . Anxiety state   . Severe manic bipolar 1 disorder with psychotic behavior ([place]) 08/05/2015  . Anxiety and depression 06/14/2015  . Mastalgia in female 06/10/2015  . Essential hypertension, benign 06/10/2015  . Lymphadenopathy, inguinal 02/09/2015  . ADHD (attention deficit hyperactivity disorder) 08/15/2014  . Mild depression ([place]) 12/15/2013  . Cough 04/07/2013  . Macromastia 04/07/2013  . Genital herpes 12/12/2011  . Tobacco abuse 09/08/2010  . GOITER, MULTINODULAR 09/05/2007  . OBESITY, NOS 03/21/2006    Medications: reviewed and updated Current Outpatient Prescriptions  Medication Sig Dispense Refill  . aspirin 81 MG chewable tablet Chew 81 mg by mouth daily as needed for mild pain.    Marland Kitchen buPROPion (WELLBUTRIN XL) 150 MG 24 hr tablet Take 1 tablet (150 mg total) by mouth daily. For depression (Patient taking differently: Take 150 mg by mouth 2 (two) times daily. For depression) 30 tablet 0  . cholecalciferol (VITAMIN D) 1000 units tablet Take 2,000  Units by mouth daily.    . Cyanocobalamin (VITAMIN B-12) 5000 MCG TBDP Take 10,000 mcg by mouth daily.    . hydrochlorothiazide (HYDRODIURIL) 25 MG tablet Take 1 tablet (25 mg total) by mouth daily. For high blood pressure 90 tablet 1  . lamoTRIgine (LAMICTAL) 100 MG tablet Take 100 mg by mouth 2 (two) times daily.     . naproxen (NAPROSYN) 500 MG tablet Take 1 tablet (500 mg total) by mouth 2 (two) times daily. For pain managment (Patient not taking: Reported on 02/07/2016) 30 tablet 0   No current facility-administered medications for this visit.     Social Hx:  reports that she has been smoking Cigarettes.  She has a 5.00 pack-year smoking history. She quit smokeless tobacco use about 5 years ago.   Objective:   LMP 01/23/2016 (Approximate)  Physical Exam  Gen: NAD, alert, cooperative with exam, well-appearing HEENT: NCAT, PERRL, clear conjunctiva, oropharynx clear, supple neck Cardiac: Regular rate and rhythm, normal S1/S2, no murmur, no edema, capillary refill brisk  Respiratory: Clear to auscultation bilaterally, no wheezes, non-labored breathing Gastrointestinal: soft, non tender, non distended, bowel sounds present Skin: no rashes, normal turgor  Neurological: no gross deficits.  Psych: good insight, normal mood and affect   Assessment & Plan:  No problem-specific Assessment & Plan notes found for this encounter.   Smitty Cords, MD [place], PGY-2

## 2016-02-16 NOTE — Progress Notes (Signed)
I have discussed the findings and exam with Nurse [place] and agree with the above note.    Nasif Bos M. Lajuana Ripple, DO PGY-3, [facility]

## 2016-02-16 NOTE — Progress Notes (Signed)
Have patient see me soon. Continue home BP check. Return to the ED or urgent care if BP > 160/80 despite compliance especially if she is having headache.Marland Kitchen

## 2016-02-16 NOTE — Progress Notes (Signed)
Patient presents to clinic with complaints of still having elevated BPs at home.  Last check was done with an electronic cuff at home and it was in the 200s on the top.  Patient states that she was increased at her last appt to 25mg  of HCTZ daily but has since increased her dose to taking 25mg  of HCTZ BID.  Patient states that she took her medication this morning and has still been experiencing the headaches and dizziness like she was previously.  Patient was placed on SDA to see Dr. Lajuana Ripple but was unable to wait until 11:30a for her appointment time.  Patient rescheduled for 02/17/16 to discuss this with a provider.  Patient declines any chest pain, SOB but is complaining of bilateral neck pain around the muscles. Will forward to MD to make her aware and possibly see if there is anything patient needs to do before coming in tomorrow.  Sherrilyn Nairn,CMA

## 2016-02-17 ENCOUNTER — Ambulatory Visit: Payer: Self-pay | Admitting: Family Medicine

## 2016-02-24 ENCOUNTER — Ambulatory Visit: Payer: Self-pay | Admitting: Family Medicine

## 2016-05-18 ENCOUNTER — Telehealth: Payer: Self-pay | Admitting: *Deleted

## 2016-05-18 ENCOUNTER — Ambulatory Visit (INDEPENDENT_AMBULATORY_CARE_PROVIDER_SITE_OTHER): Payer: Medicaid Other | Admitting: Family Medicine

## 2016-05-18 ENCOUNTER — Encounter: Payer: Self-pay | Admitting: Family Medicine

## 2016-05-18 VITALS — BP 124/88 | HR 86 | Temp 97.1°F | Ht 66.0 in | Wt 259.0 lb

## 2016-05-18 DIAGNOSIS — Z0000 Encounter for general adult medical examination without abnormal findings: Secondary | ICD-10-CM

## 2016-05-18 DIAGNOSIS — E785 Hyperlipidemia, unspecified: Secondary | ICD-10-CM

## 2016-05-18 DIAGNOSIS — F312 Bipolar disorder, current episode manic severe with psychotic features: Secondary | ICD-10-CM

## 2016-05-18 DIAGNOSIS — I10 Essential (primary) hypertension: Secondary | ICD-10-CM | POA: Diagnosis not present

## 2016-05-18 HISTORY — DX: Hyperlipidemia, unspecified: E78.5

## 2016-05-18 MED ORDER — AMLODIPINE BESYLATE 5 MG PO TABS: 5.0000 mg | ORAL_TABLET | Freq: Every day | ORAL | 1 refills | Status: DC

## 2016-05-18 NOTE — Telephone Encounter (Signed)
-----   Message from Kinnie Feil, MD sent at 05/18/2016  3:13 PM EDT ----- Please contact patient and let her know that her form is ready at the front office for pick up.  Thanks.

## 2016-05-18 NOTE — Progress Notes (Signed)
Subjective:     Patient ID: Tracy Rose, female   DOB: 11-24-1965, 51 y.o.   MRN: [id number]  HPI HTN:She is here for follow up. Her BP has been running high at home around 737T systolic and 062 diastolic. She is more concern about her diastolic reading. This morning when she checked her BP it was 147/110. She took double the dose of her HCTZ 25 mg to bring down her BP. She has associated headache and vision change with high BP, symptoms resolves once her BP comes down. She feels okay at the moment. Bipolar:She is complaint with her meds and Psych follow up. She stated she has difficulty focusing. Hence, this affected her school performance and she has not been able to keep any job for a prolonged period of time. Patient is requesting completion of school disability form. HLD: Not on meds. Here for follow up. HM: She is not up to date with her PAP.  Current Outpatient Prescriptions on File Prior to Visit  Medication Sig Dispense Refill  . aspirin 81 MG chewable tablet Chew 81 mg by mouth daily as needed for mild pain.    Marland Kitchen buPROPion (WELLBUTRIN XL) 150 MG 24 hr tablet Take 1 tablet (150 mg total) by mouth daily. For depression (Patient taking differently: Take 150 mg by mouth 2 (two) times daily. For depression) 30 tablet 0  . cholecalciferol (VITAMIN D) 1000 units tablet Take 2,000 Units by mouth daily.    . Cyanocobalamin (VITAMIN B-12) 5000 MCG TBDP Take 10,000 mcg by mouth daily.    . hydrochlorothiazide (HYDRODIURIL) 25 MG tablet Take 1 tablet (25 mg total) by mouth daily. For high blood pressure 90 tablet 1  . lamoTRIgine (LAMICTAL) 100 MG tablet Take 100 mg by mouth 2 (two) times daily.     . naproxen (NAPROSYN) 500 MG tablet Take 1 tablet (500 mg total) by mouth 2 (two) times daily. For pain managment (Patient not taking: Reported on 02/07/2016) 30 tablet 0   No current facility-administered medications on file prior to visit.    Past Medical History:  Diagnosis Date  . Anxiety  state   . Essential hypertension, benign 06/10/2015  . Genital herpes 12/12/2011  . GOITER, MULTINODULAR 09/05/2007   Dr. Zada Girt [facility]in[facility]  [place] 2011: Stable to slightly smaller bilateral thyroid nodules since 09/04/2007. No new or enlarging thyroid nodules identified. Biopsy 08/2007: non neoplastic goiter   . Mastalgia in female 06/10/2015  . Mild depression ([place]) 12/15/2013  . OBESITY, NOS 03/21/2006       . Tobacco abuse 09/08/2010  . Tobacco abuse    Vitals:   05/18/16 0933  BP: 124/88  Pulse: 86  Temp: 97.1 F (36.2 C)  TempSrc: Oral  SpO2: 99%  Weight: 259 lb (117.5 kg)  Height: 5\' 6"  (1.676 m)     Review of Systems  Respiratory: Negative.   Cardiovascular: Negative.   Gastrointestinal: Negative.   Genitourinary: Negative.   Psychiatric/Behavioral: Positive for decreased concentration. Negative for hallucinations, self-injury, sleep disturbance and suicidal ideas. The patient is nervous/anxious. The patient is not hyperactive.   All other systems reviewed and are negative.      Objective:   Physical Exam  Constitutional: She is oriented to person, place, and time. She appears well-developed. No distress.  Cardiovascular: Normal rate, regular rhythm and normal heart sounds.   No murmur heard. Pulmonary/Chest: Effort normal and breath sounds normal. No respiratory distress. She has no wheezes.  Abdominal: Soft. Bowel sounds are normal. She  exhibits no distension and no mass. There is no tenderness.  Musculoskeletal: Normal range of motion. She exhibits no edema.  Neurological: She is alert and oriented to person, place, and time. No cranial nerve deficit.  Psychiatric: She has a normal mood and affect. Her speech is normal and behavior is normal. Judgment and thought content normal. Cognition and memory are normal.  Nursing note and vitals reviewed.      Assessment:     HTN Bipolar 1 with anxiety HLD    Health maintenance Plan:     Check problem  list.  PAP offered today but she prefers to reschedule for it.

## 2016-05-18 NOTE — Patient Instructions (Signed)
It was nice seeing you today. I need to do a review of your record in order to complete your disability form. I will call whenever it is completed. Please start Norvasc 5 mg daily in addition to your HCTZ. I will love to see you back in 2 week.

## 2016-05-18 NOTE — Assessment & Plan Note (Signed)
BP looks good after taking 50 mg of HCTZ instead of 25 mg this morning per patient. Based on her home readings, BP has been running high, especially her diastolic value. I advised her not to self increase her HCTZ dose. Continue HCTZ 25 mg qd and I added Norvasc 5 mg qd to her BP regimen. F/U in 2 weeks for reassessment. In the mean time, continue home BP monitoring.

## 2016-05-18 NOTE — Assessment & Plan Note (Signed)
Diet and exercise control. To recheck FLP next visit.

## 2016-05-18 NOTE — Telephone Encounter (Signed)
Tried calling x 2, line rings once then immediately goes to a busy signal.

## 2016-05-18 NOTE — Assessment & Plan Note (Signed)
She is compliant with her meds and Psych follow-up. She is mostly managed by her psychiatrics. In addition to her Bipolar disorder,she has anxiety and ADHD that could be contributing to her poor concentration. I will complete her school form. Continue follow up with her psych.

## 2016-05-21 NOTE — Telephone Encounter (Signed)
Tried to call x 2, no answer, rings busy

## 2016-05-22 ENCOUNTER — Encounter: Payer: Self-pay | Admitting: *Deleted

## 2016-05-22 NOTE — Telephone Encounter (Signed)
Tried calling patient again with no luck.  Sent a mychart message letting her know that form is ready for pick up and to also inform us of the best number to reach her so we can make sure her chart is updated. Georgi Tuel,CMA

## 2016-05-23 ENCOUNTER — Encounter (HOSPITAL_COMMUNITY): Payer: Self-pay | Admitting: *Deleted

## 2016-05-23 ENCOUNTER — Emergency Department (HOSPITAL_COMMUNITY)
Admission: EM | Admit: 2016-05-23 | Discharge: 2016-05-23 | Disposition: A | Discharge status: Home / Self Care | Payer: Medicaid Other | Attending: Physician Assistant | Admitting: Physician Assistant

## 2016-05-23 ENCOUNTER — Emergency Department (HOSPITAL_COMMUNITY): Payer: Medicaid Other

## 2016-05-23 DIAGNOSIS — Y92410 Unspecified street and highway as the place of occurrence of the external cause: Secondary | ICD-10-CM | POA: Insufficient documentation

## 2016-05-23 DIAGNOSIS — Z87891 Personal history of nicotine dependence: Secondary | ICD-10-CM | POA: Diagnosis not present

## 2016-05-23 DIAGNOSIS — Z7982 Long term (current) use of aspirin: Secondary | ICD-10-CM | POA: Insufficient documentation

## 2016-05-23 DIAGNOSIS — Y939 Activity, unspecified: Secondary | ICD-10-CM | POA: Diagnosis not present

## 2016-05-23 DIAGNOSIS — Y999 Unspecified external cause status: Secondary | ICD-10-CM | POA: Diagnosis not present

## 2016-05-23 DIAGNOSIS — S0003XA Contusion of scalp, initial encounter: Secondary | ICD-10-CM | POA: Diagnosis not present

## 2016-05-23 DIAGNOSIS — S161XXA Strain of muscle, fascia and tendon at neck level, initial encounter: Secondary | ICD-10-CM

## 2016-05-23 DIAGNOSIS — I10 Essential (primary) hypertension: Secondary | ICD-10-CM | POA: Diagnosis not present

## 2016-05-23 DIAGNOSIS — S199XXA Unspecified injury of neck, initial encounter: Secondary | ICD-10-CM | POA: Diagnosis present

## 2016-05-23 DIAGNOSIS — F909 Attention-deficit hyperactivity disorder, unspecified type: Secondary | ICD-10-CM | POA: Diagnosis not present

## 2016-05-23 MED ORDER — ONDANSETRON 4 MG PO TBDP
4.0000 mg | ORAL_TABLET | Freq: Once | ORAL | Status: AC
  Administered 2016-05-23: 4 mg via ORAL
  Filled 2016-05-23: qty 1

## 2016-05-23 MED ORDER — OXYCODONE-ACETAMINOPHEN 5-325 MG PO TABS
1.0000 | ORAL_TABLET | Freq: Once | ORAL | Status: DC
  Filled 2016-05-23: qty 1

## 2016-05-23 MED ORDER — ACETAMINOPHEN 325 MG PO TABS
650.0000 mg | ORAL_TABLET | Freq: Once | ORAL | Status: DC
  Filled 2016-05-23: qty 2

## 2016-05-23 MED ORDER — DICLOFENAC SODIUM 50 MG PO TBEC: 50.0000 mg | DELAYED_RELEASE_TABLET | Freq: Two times a day (BID) | ORAL | 0 refills | Status: DC

## 2016-05-23 MED ORDER — CYCLOBENZAPRINE HCL 10 MG PO TABS: 10.0000 mg | ORAL_TABLET | Freq: Two times a day (BID) | ORAL | 0 refills | Status: DC | PRN

## 2016-05-23 MED ORDER — IBUPROFEN 800 MG PO TABS
800.0000 mg | ORAL_TABLET | Freq: Once | ORAL | Status: AC
  Administered 2016-05-23: 800 mg via ORAL
  Filled 2016-05-23: qty 1

## 2016-05-23 MED ORDER — CYCLOBENZAPRINE HCL 10 MG PO TABS
10.0000 mg | ORAL_TABLET | Freq: Once | ORAL | Status: AC
  Administered 2016-05-23: 10 mg via ORAL
  Filled 2016-05-23: qty 1
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
## 2016-05-23 NOTE — ED Notes (Signed)
Provided meal x2

## 2016-05-23 NOTE — ED Notes (Signed)
Patient left at this time with all belongings. 

## 2016-05-23 NOTE — Discharge Instructions (Signed)
Do not take the muscle relaxant if driving as it will make you sleepy. Follow up with your doctor or return here for worsening symptoms.
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
## 2016-05-29 ENCOUNTER — Encounter: Payer: Self-pay | Admitting: Family Medicine

## 2016-05-29 ENCOUNTER — Ambulatory Visit (INDEPENDENT_AMBULATORY_CARE_PROVIDER_SITE_OTHER): Payer: Medicaid Other | Admitting: Family Medicine

## 2016-05-29 VITALS — BP 128/86 | HR 86 | Temp 97.6°F | Wt 267.0 lb

## 2016-05-29 DIAGNOSIS — S161XXD Strain of muscle, fascia and tendon at neck level, subsequent encounter: Secondary | ICD-10-CM | POA: Insufficient documentation

## 2016-05-29 HISTORY — DX: Strain of muscle, fascia and tendon at neck level, subsequent encounter: S16.1XXD

## 2016-05-29 MED ORDER — IBUPROFEN 800 MG PO TABS: 800.0000 mg | ORAL_TABLET | Freq: Three times a day (TID) | ORAL | 0 refills | Status: DC | PRN

## 2016-05-29 NOTE — Assessment & Plan Note (Signed)
Discussed scheduling ibuprofen 800mg  TID prn with tylenol as needed in between. Encouraged supportive care with heat and OTC icyhot ointment and stretches.
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
## 2016-08-13 ENCOUNTER — Other Ambulatory Visit: Payer: Self-pay | Admitting: Family Medicine

## 2016-08-15 ENCOUNTER — Other Ambulatory Visit: Payer: Self-pay | Admitting: Family Medicine

## 2016-08-17 ENCOUNTER — Other Ambulatory Visit (HOSPITAL_COMMUNITY)
Admission: RE | Admit: 2016-08-17 | Discharge: 2016-08-17 | Disposition: A | Discharge status: Home / Self Care | Payer: Medicaid Other | Source: Ambulatory Visit | Attending: Family Medicine | Admitting: Family Medicine

## 2016-08-17 ENCOUNTER — Encounter: Payer: Self-pay | Admitting: Family Medicine

## 2016-08-17 ENCOUNTER — Telehealth: Payer: Self-pay | Admitting: Family Medicine

## 2016-08-17 ENCOUNTER — Ambulatory Visit (INDEPENDENT_AMBULATORY_CARE_PROVIDER_SITE_OTHER): Payer: Medicaid Other | Admitting: Family Medicine

## 2016-08-17 VITALS — BP 120/64 | HR 116 | Temp 97.8°F | Ht 66.0 in | Wt 268.0 lb

## 2016-08-17 DIAGNOSIS — R000 Tachycardia, unspecified: Secondary | ICD-10-CM | POA: Diagnosis not present

## 2016-08-17 DIAGNOSIS — Z0000 Encounter for general adult medical examination without abnormal findings: Secondary | ICD-10-CM | POA: Diagnosis not present

## 2016-08-17 DIAGNOSIS — Z124 Encounter for screening for malignant neoplasm of cervix: Secondary | ICD-10-CM

## 2016-08-17 DIAGNOSIS — R87610 Atypical squamous cells of undetermined significance on cytologic smear of cervix (ASC-US): Secondary | ICD-10-CM | POA: Insufficient documentation

## 2016-08-17 DIAGNOSIS — E785 Hyperlipidemia, unspecified: Secondary | ICD-10-CM | POA: Diagnosis not present

## 2016-08-17 NOTE — Progress Notes (Signed)
Subjective:     Tracy Rose is a 51 y.o. female and is here for a comprehensive physical exam. The patient reports she forgot to take her mood stabilizing medication this morning and her heart beat is racing. She asked that I hurry so she can get back home to take her meds. Denies chest pain, no SOB.  Social History   Social History  . Marital status: Divorced    Spouse name: N/A  . Number of children: 1  . Years of education: Assoc.   Occupational History  . [place]   Social History Main Topics  . Smoking status: Former Smoker    Packs/day: 0.20    Years: 25.00    Types: Cigarettes  . Smokeless tobacco: Former Systems developer    Quit date: 10/28/2010  . Alcohol use No  . Drug use: No  . Sexual activity: Not Currently   Other Topics Concern  . Not on file   Social History Narrative   Unemployed.  Associates Degree.  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   Caffeine Use: none   Health Maintenance  Topic Date Due  . PAP SMEAR  03/12/2016  . MAMMOGRAM  06/14/2016  . INFLUENZA VACCINE  08/22/2016  . TETANUS/TDAP  07/13/2022  . HIV Screening  Completed    The following portions of the patient's history were reviewed and updated as appropriate: allergies, current medications, past family history, past medical history, past social history, past surgical history and problem list.  Review of Systems Review of Systems  Constitutional: Negative.   Respiratory: Negative.   Cardiovascular: Negative for chest pain, palpitations and leg swelling.  Genitourinary: Negative.   Skin: Negative.   Psychiatric/Behavioral: Negative for suicidal ideas. The patient is nervous/anxious.        Nervous about getting PAP done today     Objective:     Physical Exam  Constitutional: She is oriented to person, place, and time. She appears well-nourished. No distress.  HENT:  Head: Normocephalic.  Right Ear: Tympanic membrane,  external ear and ear canal normal.  Left Ear: Tympanic membrane, external ear and ear canal normal.  Mouth/Throat: Oropharynx is clear and moist. No oropharyngeal exudate.  Eyes: Pupils are equal, round, and reactive to light. Conjunctivae and EOM are normal.  Neck: No thyroid mass and no thyromegaly present.  Cardiovascular: Regular rhythm, S1 normal, S2 normal and normal heart sounds.  Tachycardia present.   No murmur heard. Pulmonary/Chest: Effort normal and breath sounds normal.  Abdominal: Soft. Normal appearance and bowel sounds are normal. There is no hepatosplenomegaly. There is no tenderness.  Genitourinary: Vagina normal and uterus normal. Pelvic exam was performed with patient supine. There is no tenderness on the right labia. There is no tenderness on the left labia. Cervix exhibits no motion tenderness and no discharge. Right adnexum displays no mass and no tenderness. Left adnexum displays no mass and no tenderness.    Musculoskeletal: She exhibits no edema.  Neurological: She is alert and oriented to person, place, and time. No cranial nerve deficit or sensory deficit.  Skin: Skin is warm.  Psychiatric: Her speech is normal. She does not express impulsivity.  A bit talkative this morning and nervous She is attentive.  Nursing note and vitals reviewed.  Body mass index is 43.26 kg/m.    Assessment:    Healthy female exam. Gyn exam completed    Morbid obesity Plan:  PAP completed today. She has whitish patches on her Cervix, likely condylomata vs LSIL. Could not discuss with patient during visit due to her nervousness. I called later to check on her and discuss pelvic exam finding, she did not pick up and I was unable to leave a message. I will attempt to call later. I will call her with PAP result when available. If PAP is normal, I will send her to [facility] to biopsy the lesion seen on exam today. Mammogram slip given today to schedule mammogram  appointment. FLP ordered with TSH for her Tachycardia but she did not wait for blood drawn, she stated she has to rush home to use her meds. Will switch to future order.  Morbid obesity: Diet and exercise counseling done today. Will reassess during next visit.

## 2016-08-17 NOTE — Patient Instructions (Signed)

## 2016-08-17 NOTE — Telephone Encounter (Signed)
Message left to call back.  Wanted to follow-up with her on today's visit.

## 2016-08-17 NOTE — Assessment & Plan Note (Signed)
Morbid obesity: Diet and exercise counseling done today. Will reassess during next visit.

## 2016-08-20 ENCOUNTER — Other Ambulatory Visit: Payer: Medicaid Other

## 2016-08-22 ENCOUNTER — Telehealth: Payer: Self-pay | Admitting: Family Medicine

## 2016-08-22 ENCOUNTER — Telehealth: Payer: Self-pay

## 2016-08-22 LAB — CYTOLOGY - PAP
Diagnosis: UNDETERMINED — AB
HPV: NOT DETECTED

## 2016-08-22 NOTE — Telephone Encounter (Signed)
Patient called back. PAP result discussed with her as well as cervical lesion. Appointment made at [facility] clinic for next Thursday. All questions were answered.

## 2016-08-22 NOTE — Telephone Encounter (Signed)
I called all number listed on file for patient to discuss her PAP result. I was unable to reach her or leave a message.  PAP shows ASCUS with neg HPV. Ideally she can repeat PAP in 3 yrs without colpo. However, I saw multiple thick whitish lesion on GU exam that warrants biopsy. I will like to schedule Gyn appointment for cervical biopsy.  Will attempt to reach her to schedule appointment.

## 2016-08-22 NOTE — Telephone Encounter (Signed)
Pt has been scheduled for 8/9 in colpo clinic.

## 2016-08-22 NOTE — Telephone Encounter (Signed)
-----   Message from Kinnie Feil, MD sent at 08/22/2016  9:41 AM EDT ----- Please call patient to schedule Gyn clinic appointment for cervical lesion.

## 2016-08-30 ENCOUNTER — Ambulatory Visit (INDEPENDENT_AMBULATORY_CARE_PROVIDER_SITE_OTHER): Payer: Medicaid Other | Admitting: Family Medicine

## 2016-08-30 VITALS — BP 124/70 | HR 96 | Temp 98.3°F | Ht 66.0 in | Wt 262.8 lb

## 2016-08-30 DIAGNOSIS — B977 Papillomavirus as the cause of diseases classified elsewhere: Secondary | ICD-10-CM | POA: Diagnosis present

## 2016-08-30 NOTE — Patient Instructions (Signed)
Colposcopy today was entirely normal. I have reviewed her chart and your risk factors and your next Pap smear should be done in 3 years. It was great to see you! Please call us with any questions

## 2016-08-30 NOTE — Progress Notes (Signed)
Pap ASCUS with negative Hi risk HPV but possible cervical lesion(s) seen on gross exam at pap so she was sent for colposcopy to further evaluate.  Patient given informed consent, signed copy in the chart.  Placed in lithotomy position. Cervix viewed with speculum and colposcope after application of acetic acid.   Colposcopy adequate (entire squamocolumnar junctions seen  in entirety) ?  Yes Acetowhite lesions? No Punctation? No Mosaicism?  No Abnormal vasculature?  No Biopsies? No ECC? No Complications? None  COMMENTS:  Patient was given post procedure instructions.    A/P:

## 2016-10-30 ENCOUNTER — Ambulatory Visit (INDEPENDENT_AMBULATORY_CARE_PROVIDER_SITE_OTHER): Payer: Medicaid Other | Admitting: Family Medicine

## 2016-10-30 ENCOUNTER — Encounter: Payer: Self-pay | Admitting: Family Medicine

## 2016-10-30 VITALS — BP 122/68 | HR 67 | Temp 98.3°F | Ht 66.0 in | Wt 272.0 lb

## 2016-10-30 DIAGNOSIS — I10 Essential (primary) hypertension: Secondary | ICD-10-CM

## 2016-10-30 DIAGNOSIS — Z1231 Encounter for screening mammogram for malignant neoplasm of breast: Secondary | ICD-10-CM | POA: Diagnosis not present

## 2016-10-30 DIAGNOSIS — N920 Excessive and frequent menstruation with regular cycle: Secondary | ICD-10-CM

## 2016-10-30 DIAGNOSIS — Z1239 Encounter for other screening for malignant neoplasm of breast: Secondary | ICD-10-CM

## 2016-10-30 MED ORDER — HYDROCHLOROTHIAZIDE 25 MG PO TABS: 25.0000 mg | ORAL_TABLET | Freq: Every day | ORAL | 2 refills | Status: DC

## 2016-10-30 NOTE — Addendum Note (Signed)
Addended by: Andrena Mews T on: 10/30/2016 03:29 PM   Modules accepted: Orders

## 2016-10-30 NOTE — Patient Instructions (Signed)
Menorrhagia Menorrhagia is when your menstrual periods are heavy or last longer than usual. Follow these instructions at home:  Only take medicine as told by your doctor.  Take any iron pills as told by your doctor. Heavy bleeding may cause low levels of iron in your body.  Do not take aspirin 1 week before or during your period. Aspirin can make the bleeding worse.  Lie down for a while if you change your tampon or pad more than once in 2 hours. This may help lessen the bleeding.  Eat a healthy diet and foods with iron. These foods include leafy green vegetables, meat, liver, eggs, and whole grain breads and cereals.  Do not try to lose weight. Wait until the heavy bleeding has stopped and your iron level is normal. Contact a doctor if:  You soak through a pad or tampon every 1 or 2 hours, and this happens every time you have a period.  You need to use pads and tampons at the same time because you are bleeding so much.  You need to change your pad or tampon during the night.  You have a period that lasts for more than 8 days.  You pass clots bigger than 1 inch (2.5 cm) wide.  You have irregular periods that happen more or less often than once a month.  You feel dizzy or pass out (faint).  You feel very weak or tired.  You feel short of breath or feel your heart is beating too fast when you exercise.  You feel sick to your stomach (nausea) and you throw up (vomit) while you are taking your medicine.  You have watery poop (diarrhea) while you are taking your medicine.  You have any problems that may be related to the medicine you are taking. Get help right away if:  You soak through 4 or more pads or tampons in 2 hours.  You have any bleeding while you are pregnant. This information is not intended to replace advice given to you by your health care provider. Make sure you discuss any questions you have with your health care provider. Document Released: 10/18/2007 Document  Revised: 06/16/2015 Document Reviewed: 07/10/2012 Elsevier Interactive Patient Education  2017 Elsevier Inc.  

## 2016-10-30 NOTE — Assessment & Plan Note (Signed)
Not currently bleeding heavily. This seems be be reoccurring since 2014/2015. She is frustrated and will like to get hysterectomy done. I discussed referral to Gyn. In the mean time I checked her TSH and CBC today. Pelvic US ordered. Referral to Gyn placed. Since bleeding is subsiding no meds given at this time. Consider OCP. She declined OCP at this time. I will contact her soon with test result. F/U as needed.

## 2016-10-30 NOTE — Assessment & Plan Note (Signed)
BP looks good. I refilled her HCTZ.

## 2016-10-30 NOTE — Progress Notes (Addendum)
Subjective:     Patient ID: Tracy Rose, female   DOB: 17-Feb-1965, 51 y.o.   MRN: [id number]  Vaginal Bleeding  The patient's primary symptoms include vaginal bleeding. Primary symptoms comment: LMP 10/22/16 she bled for about 5-6 days. Bleeding was very heavy with clot coming out everyday. This is a recurrent problem. Episode frequency: Occurs with most of her periods. The problem has been gradually worsening (She woke up 6 days ago with blood all over her on the bed). The pain is moderate (Moderate to severe back and cramping belly pain with her period). She is not pregnant. Associated symptoms include abdominal pain. Associated symptoms comments: Fatigue and dizziness. The vaginal bleeding is heavier than menses. She has been passing clots. Nothing aggravates the symptoms. She has tried nothing for the symptoms. She is not sexually active. She uses nothing for contraception. Her menstrual history has been regular. There is no history of an abdominal surgery or a gynecological surgery.  She is requesting for hysterectomy. HTN: she is compliant with her meds. Requesting refill of her HCTZ.  Current Outpatient Prescriptions on File Prior to Visit  Medication Sig Dispense Refill  . amLODipine (NORVASC) 5 MG tablet TAKE 1 TABLET(5 MG) BY MOUTH DAILY 90 tablet 1  . aspirin 81 MG chewable tablet Chew 81 mg by mouth daily as needed for mild pain.    Marland Kitchen buPROPion (WELLBUTRIN XL) 150 MG 24 hr tablet Take 1 tablet (150 mg total) by mouth daily. For depression (Patient taking differently: Take 150 mg by mouth 2 (two) times daily. For depression) 30 tablet 0  . cholecalciferol (VITAMIN D) 1000 units tablet Take 2,000 Units by mouth daily.    . Cyanocobalamin (VITAMIN B-12) 5000 MCG TBDP Take 10,000 mcg by mouth daily.    . hydrochlorothiazide (HYDRODIURIL) 25 MG tablet Take 1 tablet (25 mg total) by mouth daily. 90 tablet 2  . ibuprofen (ADVIL,MOTRIN) 800 MG tablet Take 1 tablet (800 mg total) by mouth every  8 (eight) hours as needed for mild pain or moderate pain. 30 tablet 0  . lamoTRIgine (LAMICTAL) 100 MG tablet Take 100 mg by mouth 2 (two) times daily.      No current facility-administered medications on file prior to visit.    Past Medical History:  Diagnosis Date  . Anxiety state   . Cervical strain, acute, subsequent encounter 05/29/2016  . Essential hypertension, benign 06/10/2015  . Genital herpes 12/12/2011  . GOITER, MULTINODULAR 09/05/2007   Dr. Zada Girt [facility]  [place] 2011: Stable to slightly smaller bilateral thyroid nodules since 09/04/2007. No new or enlarging thyroid nodules identified. Biopsy 08/2007: non neoplastic goiter   . Hyperlipidemia 05/18/2016  . Mastalgia in female 06/10/2015  . Mild depression ([place]) 12/15/2013  . OBESITY, NOS 03/21/2006       . Tobacco abuse 09/08/2010  . Tobacco abuse    Vitals:   10/30/16 0918  BP: 122/68  Pulse: 67  Temp: 98.3 F (36.8 C)  TempSrc: Oral  SpO2: 98%  Weight: 272 lb (123.4 kg)  Height: 5\' 6"  (1.676 m)     Review of Systems  Respiratory: Negative.   Cardiovascular: Negative.   Gastrointestinal: Positive for abdominal pain.  Genitourinary: Positive for vaginal bleeding.  All other systems reviewed and are negative.      Objective:   Physical Exam  Constitutional: She is oriented to person, place, and time. She appears well-developed. No distress.  HENT:  No conjunctiva pallor  Cardiovascular: Normal rate, regular rhythm and  normal heart sounds.   No murmur heard. Pulmonary/Chest: Effort normal and breath sounds normal. No respiratory distress. She has no wheezes.  Abdominal: Soft. Bowel sounds are normal. She exhibits no distension and no mass. There is no tenderness.  Genitourinary: No labial fusion. There is no rash, tenderness, lesion or injury on the right labia. There is no rash, tenderness, lesion or injury on the left labia. Cervix exhibits no motion tenderness. Right adnexum displays no mass, no  tenderness and no fullness. Left adnexum displays no mass, no tenderness and no fullness. No vaginal discharge found.    Musculoskeletal: She exhibits no edema.  Neurological: She is alert and oriented to person, place, and time.  Nursing note and vitals reviewed.      Assessment:     Menorrhagia HTN    Plan:     See problem list. Mammogram recommended. Slip given to call for an appointment and an order was placed as well.

## 2016-10-31 ENCOUNTER — Telehealth: Payer: Self-pay | Admitting: Family Medicine

## 2016-10-31 DIAGNOSIS — H547 Unspecified visual loss: Secondary | ICD-10-CM

## 2016-10-31 LAB — CBC WITH DIFFERENTIAL/PLATELET
BASOS ABS: 0 10*3/uL (ref 0.0–0.2)
Basos: 0 %
EOS (ABSOLUTE): 0.1 10*3/uL (ref 0.0–0.4)
Eos: 2 %
HEMATOCRIT: 34.4 % (ref 34.0–46.6)
Hemoglobin: 10.9 g/dL — ABNORMAL LOW (ref 11.1–15.9)
Immature Grans (Abs): 0 10*3/uL (ref 0.0–0.1)
Immature Granulocytes: 0 %
Lymphocytes Absolute: 2.8 10*3/uL (ref 0.7–3.1)
Lymphs: 45 %
MCH: 26 pg — ABNORMAL LOW (ref 26.6–33.0)
MCHC: 31.7 g/dL (ref 31.5–35.7)
MCV: 82 fL (ref 79–97)
MONOS ABS: 0.3 10*3/uL (ref 0.1–0.9)
Monocytes: 4 %
NEUTROS PCT: 49 %
Neutrophils Absolute: 3.1 10*3/uL (ref 1.4–7.0)
PLATELETS: 384 10*3/uL — AB (ref 150–379)
RBC: 4.2 x10E6/uL (ref 3.77–5.28)
RDW: 15 % (ref 12.3–15.4)
WBC: 6.3 10*3/uL (ref 3.4–10.8)

## 2016-10-31 LAB — TSH: TSH: 2.87 u[IU]/mL (ref 0.450–4.500)

## 2016-10-31 MED ORDER — FERROUS SULFATE 325 (65 FE) MG PO TABS: 325.0000 mg | ORAL_TABLET | Freq: Two times a day (BID) | ORAL | 2 refills | Status: DC

## 2016-10-31 NOTE — Telephone Encounter (Signed)
Result discussed with patient. I recommended ferrous sulphate for anemia.  She stated she need referral to an eye doctor for poor vision. Referral placed.

## 2016-11-06 ENCOUNTER — Ambulatory Visit (HOSPITAL_COMMUNITY): Payer: Medicaid Other

## 2016-11-09 ENCOUNTER — Other Ambulatory Visit: Payer: Self-pay | Admitting: Family Medicine

## 2016-11-09 ENCOUNTER — Ambulatory Visit (HOSPITAL_COMMUNITY): Admission: RE | Admit: 2016-11-09 | Payer: Medicaid Other | Source: Ambulatory Visit

## 2016-11-15 ENCOUNTER — Telehealth: Payer: Self-pay | Admitting: Family Medicine

## 2016-11-15 ENCOUNTER — Ambulatory Visit (HOSPITAL_COMMUNITY): Payer: Medicaid Other

## 2016-11-15 NOTE — Telephone Encounter (Signed)
Wants an antibotic for cough and feeling weak. She is coughing up green and yellow mucus. Because the weather is cold, it makes it hard to breathe.  She would like erythromycin.  [facility] at [address]

## 2016-11-15 NOTE — Telephone Encounter (Signed)
Patient voiced understanding and reminded her of tomorrow's appointment time. Jazmin Hartsell,CMA

## 2016-11-15 NOTE — Telephone Encounter (Signed)
It seems she will be here to see me tomorrow. I will hold off on prescribing A/B till I see her. If symptoms worsen before then, please advise her to go to the ED/Urgent care.

## 2016-11-16 ENCOUNTER — Other Ambulatory Visit (HOSPITAL_COMMUNITY)
Admission: RE | Admit: 2016-11-16 | Discharge: 2016-11-16 | Disposition: A | Discharge status: Home / Self Care | Payer: Medicaid Other | Source: Ambulatory Visit | Attending: Family Medicine | Admitting: Family Medicine

## 2016-11-16 ENCOUNTER — Ambulatory Visit (INDEPENDENT_AMBULATORY_CARE_PROVIDER_SITE_OTHER): Payer: Medicaid Other | Admitting: Family Medicine

## 2016-11-16 ENCOUNTER — Encounter: Payer: Self-pay | Admitting: Family Medicine

## 2016-11-16 VITALS — BP 124/76 | HR 85 | Temp 98.0°F | Ht 66.0 in | Wt 275.0 lb

## 2016-11-16 DIAGNOSIS — N898 Other specified noninflammatory disorders of vagina: Secondary | ICD-10-CM | POA: Diagnosis present

## 2016-11-16 DIAGNOSIS — A6000 Herpesviral infection of urogenital system, unspecified: Secondary | ICD-10-CM

## 2016-11-16 DIAGNOSIS — R05 Cough: Secondary | ICD-10-CM | POA: Diagnosis not present

## 2016-11-16 DIAGNOSIS — Z114 Encounter for screening for human immunodeficiency virus [HIV]: Secondary | ICD-10-CM

## 2016-11-16 DIAGNOSIS — R059 Cough, unspecified: Secondary | ICD-10-CM

## 2016-11-16 DIAGNOSIS — N760 Acute vaginitis: Secondary | ICD-10-CM

## 2016-11-16 LAB — POCT UA - MICROSCOPIC ONLY

## 2016-11-16 LAB — POCT URINALYSIS DIP (MANUAL ENTRY)
Bilirubin, UA: NEGATIVE
Glucose, UA: NEGATIVE mg/dL
Ketones, POC UA: NEGATIVE mg/dL
Leukocytes, UA: NEGATIVE
NITRITE UA: NEGATIVE
PROTEIN UA: NEGATIVE mg/dL
SPEC GRAV UA: 1.02 (ref 1.010–1.025)
UROBILINOGEN UA: 0.2 U/dL
pH, UA: 5.5 (ref 5.0–8.0)

## 2016-11-16 MED ORDER — AZITHROMYCIN 250 MG PO TABS: ORAL_TABLET | ORAL | 0 refills | Status: DC

## 2016-11-16 NOTE — Patient Instructions (Signed)
Cervicitis Cervicitis is when the cervix gets irritated and swollen. Your cervix is the lower end of your uterus. Follow these instructions at home:  Do not have sex until your doctor says it is okay.  Take over-the-counter and prescription medicines only as told by your doctor.  If you were prescribed an antibiotic medicine, take it as told by your doctor. Do not stop taking it even if you start to feel better.  Keep all follow-up visits as told by your doctor. This is important. Contact a doctor if:  Your symptoms come back after treatment.  Your symptoms get worse after treatment.  You have a fever.  You feel tired (fatigued).  Your belly (abdomen) hurts.  You feel like you are going to throw up (are nauseous).  You throw up (vomit).  You have watery poop (diarrhea).  Your back hurts. Get help right away if:  You have very bad pain in your belly, and medicine does not help it.  You cannot pee (urinate). Summary  Cervicitis is when the cervix gets irritated and swollen.  Do not have sex until your doctor says it is okay.  If you need to take an antibiotic, do not stop taking even if you start to feel better. Take medicines only as told by your doctor. This information is not intended to replace advice given to you by your health care provider. Make sure you discuss any questions you have with your health care provider. Document Released: 10/18/2007 Document Revised: 09/25/2015 Document Reviewed: 09/25/2015 Elsevier Interactive Patient Education  2017 Elsevier Inc.  

## 2016-11-16 NOTE — Assessment & Plan Note (Signed)
Likely bronchitis of viral origin, however, given her symptoms, she might have superimposed bacterial infection. Start Zithromax. Use OTC cough regimen. Consider chest xray if no improvement or if this is getting worse. F/U in 1 week for reassessment.

## 2016-11-16 NOTE — Assessment & Plan Note (Signed)
She declined pelvic exam. Since it is unclear what is going on I am not sure how to treat. She later stated she wants to be screened for STDs although she stated she is not sexually active. HIV, RPR and Urine GC/Chlamydia ordered since she will not allow pelvic exam. I will contact her with result. F/U soon for pelvic exam if no improvement.

## 2016-11-16 NOTE — Progress Notes (Signed)
Subjective:     Patient ID: Tracy Rose, female   DOB: [birth year], 51 y.o.   MRN: [id number]  Cough  This is a new problem. The current episode started in the past 7 days. The problem has been gradually improving. The problem occurs every few hours. The cough is productive of sputum (Greenish, black sputum and at times yellow. Sputum is large and thick). Associated symptoms include chest pain, chills and shortness of breath. Pertinent negatives include no fever, hemoptysis, nasal congestion, postnasal drip or wheezing. Nothing aggravates the symptoms. Risk factors: No sick contact. She has tried nothing (Been drinking warm tea) for the symptoms. The treatment provided no relief. There is no history of COPD or environmental allergies.  Vaginal Discharge  The patient's primary symptoms include genital lesions and vaginal discharge. The patient's pertinent negatives include no pelvic pain. Primary symptoms comment: started having vaginal discharge 1 weeks ago. She has blisters and itching, she feels she had broke out with herpes. This is a new problem. The current episode started in the past 7 days. The problem occurs constantly. The problem has been unchanged. Associated symptoms include chills. Pertinent negatives include no dysuria or fever. The vaginal discharge was brown and thin. Nothing aggravates the symptoms. She has tried nothing for the symptoms. She is not sexually active. She uses nothing for contraception. Her menstrual history has been irregular.  Genital sores: She is having herpes outbreak.She was stressed last week. She is compliant with her Acyclovir.  Current Outpatient Prescriptions on File Prior to Visit  Medication Sig Dispense Refill  . acyclovir (ZOVIRAX) 400 MG tablet TAKE 1 TABLET(400 MG) BY MOUTH TWICE DAILY 60 tablet 3  . amLODipine (NORVASC) 5 MG tablet TAKE 1 TABLET(5 MG) BY MOUTH DAILY 90 tablet 1  . aspirin 81 MG chewable tablet Chew 81 mg by mouth daily as needed for  mild pain.    Marland Kitchen buPROPion (WELLBUTRIN XL) 150 MG 24 hr tablet Take 1 tablet (150 mg total) by mouth daily. For depression (Patient taking differently: Take 150 mg by mouth 2 (two) times daily. For depression) 30 tablet 0  . cholecalciferol (VITAMIN D) 1000 units tablet Take 2,000 Units by mouth daily.    . Cyanocobalamin (VITAMIN B-12) 5000 MCG TBDP Take 10,000 mcg by mouth daily.    . ferrous sulfate 325 (65 FE) MG tablet Take 1 tablet (325 mg total) by mouth 2 (two) times daily with a meal. 60 tablet 2  . hydrochlorothiazide (HYDRODIURIL) 25 MG tablet Take 1 tablet (25 mg total) by mouth daily. 90 tablet 2  . ibuprofen (ADVIL,MOTRIN) 800 MG tablet Take 1 tablet (800 mg total) by mouth every 8 (eight) hours as needed for mild pain or moderate pain. 30 tablet 0  . lamoTRIgine (LAMICTAL) 100 MG tablet Take 100 mg by mouth 2 (two) times daily.      No current facility-administered medications on file prior to visit.     Past Medical History:  Diagnosis Date  . Anxiety state   . Cervical strain, acute, subsequent encounter 05/29/2016  . Essential hypertension, benign 06/10/2015  . Genital herpes 12/12/2011  . GOITER, MULTINODULAR 09/05/2007   Dr. Zada Girt [facility]in[facility]  [place] 2011: Stable to slightly smaller bilateral thyroid nodules since 09/04/2007. No new or enlarging thyroid nodules identified. Biopsy 08/2007: non neoplastic goiter   . Hyperlipidemia 05/18/2016  . Mastalgia in female 06/10/2015  . Mild depression ([place]) 12/15/2013  . OBESITY, NOS 03/21/2006       . Tobacco  abuse 09/08/2010  . Tobacco abuse     Vitals:   11/16/16 1053  BP: 124/76  Pulse: 85  Temp: 98 F (36.7 C)  TempSrc: Oral  SpO2: 98%  Weight: 275 lb (124.7 kg)  Height: 5\' 6"  (1.676 m)     Review of Systems  Constitutional: Positive for chills. Negative for fever.  HENT: Negative for postnasal drip.   Respiratory: Positive for cough and shortness of breath. Negative for hemoptysis and wheezing.    Cardiovascular: Positive for chest pain.  Gastrointestinal: Negative.   Genitourinary: Positive for genital sores and vaginal discharge. Negative for dysuria and pelvic pain.  Allergic/Immunologic: Negative for environmental allergies.  All other systems reviewed and are negative.   Objective:   Physical Exam  Constitutional: She is oriented to person, place, and time. She appears well-developed. No distress.  Cardiovascular: Normal rate, regular rhythm and normal heart sounds.   No murmur heard. Pulmonary/Chest: Effort normal and breath sounds normal. No respiratory distress. She has no wheezes.  Abdominal: Soft. Bowel sounds are normal. She exhibits no distension and no mass. There is no tenderness.  Genitourinary:  Genitourinary Comments: She declined GU exam  Musculoskeletal: Normal range of motion. She exhibits no edema.  Neurological: She is alert and oriented to person, place, and time.  Nursing note and vitals reviewed.      Assessment:     Cough: Likely bronchitis Vaginitis Herpes vaginalis    Plan:     Check problem list.

## 2016-11-16 NOTE — Assessment & Plan Note (Signed)
Herpes outbreak. She is on Acyclovir prophylactic treatment. I advised she takes treatment dose for the next 5 days, I.e 500 mg TID for 5 days and then go back to her regular dose. She agreed with plan.

## 2016-11-17 LAB — HIV ANTIBODY (ROUTINE TESTING W REFLEX): HIV Screen 4th Generation wRfx: NONREACTIVE

## 2016-11-19 LAB — URINE CYTOLOGY ANCILLARY ONLY
CHLAMYDIA, DNA PROBE: NEGATIVE
Neisseria Gonorrhea: NEGATIVE
Trichomonas: NEGATIVE

## 2016-11-20 ENCOUNTER — Telehealth: Payer: Self-pay

## 2016-11-20 ENCOUNTER — Ambulatory Visit (HOSPITAL_COMMUNITY)
Admission: RE | Admit: 2016-11-20 | Discharge: 2016-11-20 | Disposition: A | Discharge status: Home / Self Care | Payer: Medicaid Other | Source: Ambulatory Visit | Attending: Family Medicine | Admitting: Family Medicine

## 2016-11-20 DIAGNOSIS — N920 Excessive and frequent menstruation with regular cycle: Secondary | ICD-10-CM

## 2016-11-20 DIAGNOSIS — D252 Subserosal leiomyoma of uterus: Secondary | ICD-10-CM | POA: Insufficient documentation

## 2016-11-20 NOTE — Telephone Encounter (Signed)
Pt contacted and informed of normal std results. Pt was appreciative and voiced understanding.

## 2016-11-20 NOTE — Telephone Encounter (Signed)
-----   Message from Kinnie Feil, MD sent at 11/20/2016 10:59 AM EDT ----- Please call to inform patient that her STD test are all neg. No HIV, no Gonorrhea or Chlamydia. Thanks.

## 2016-11-21 ENCOUNTER — Telehealth: Payer: Self-pay | Admitting: Family Medicine

## 2016-11-21 NOTE — Telephone Encounter (Signed)
I discussed pelvic US result with her. She has upcoming Gyn appointment in few weeks. She will like to proceed with hysterectomy. I advised her to discuss this with her gynecologist. She agreed with plan.

## 2016-12-02 ENCOUNTER — Other Ambulatory Visit: Payer: Self-pay | Admitting: Family Medicine

## 2016-12-02 DIAGNOSIS — S161XXD Strain of muscle, fascia and tendon at neck level, subsequent encounter: Secondary | ICD-10-CM

## 2016-12-04 ENCOUNTER — Inpatient Hospital Stay: Admission: RE | Admit: 2016-12-04 | Payer: Medicaid Other | Source: Ambulatory Visit

## 2016-12-11 ENCOUNTER — Ambulatory Visit (INDEPENDENT_AMBULATORY_CARE_PROVIDER_SITE_OTHER): Payer: Medicaid Other | Admitting: Family Medicine

## 2016-12-11 ENCOUNTER — Other Ambulatory Visit (HOSPITAL_COMMUNITY)
Admission: RE | Admit: 2016-12-11 | Discharge: 2016-12-11 | Disposition: A | Discharge status: Home / Self Care | Payer: Medicaid Other | Source: Ambulatory Visit | Attending: Family Medicine | Admitting: Family Medicine

## 2016-12-11 ENCOUNTER — Encounter: Payer: Self-pay | Admitting: Family Medicine

## 2016-12-11 ENCOUNTER — Other Ambulatory Visit (HOSPITAL_COMMUNITY): Admission: RE | Admit: 2016-12-11 | Payer: Self-pay | Source: Ambulatory Visit | Admitting: Family Medicine

## 2016-12-11 VITALS — BP 124/92 | HR 83 | Ht 66.0 in | Wt 275.1 lb

## 2016-12-11 DIAGNOSIS — N921 Excessive and frequent menstruation with irregular cycle: Secondary | ICD-10-CM | POA: Insufficient documentation

## 2016-12-11 DIAGNOSIS — Z113 Encounter for screening for infections with a predominantly sexual mode of transmission: Secondary | ICD-10-CM | POA: Insufficient documentation

## 2016-12-11 MED ORDER — MEGESTROL ACETATE 40 MG PO TABS: 40.0000 mg | ORAL_TABLET | Freq: Two times a day (BID) | ORAL | 3 refills | Status: DC

## 2016-12-11 NOTE — Progress Notes (Signed)
   Subjective:    Patient ID: Tracy Rose is a 51 y.o. female presenting with Menorrhagia  on 12/11/2016  HPI: [id number] referred from [facility] for bleeding. Notes cycle has been heavier and regular. Cycles last 4 or 10 days. Notes very heavy bleeding.  Has had some anemia. Has not tried anything for bleeding. Taking geritol for anemia. Has h/o SVD x 2. Nml TSH and glucose testing. Has had office EMB by Dr. Nehemiah Settle in 12/15. Wants screen for STD.  Review of Systems  Constitutional: Negative for chills and fever.  Respiratory: Negative for shortness of breath.   Cardiovascular: Negative for chest pain.  Gastrointestinal: Negative for abdominal pain, nausea and vomiting.  Genitourinary: Negative for dysuria.  Skin: Negative for rash.      Objective:    BP (!) 124/92   Pulse 83   Ht 5\' 6"  (1.676 m)   Wt 275 lb 1.6 oz (124.8 kg)   LMP 11/28/2016 (Approximate)   BMI 44.40 kg/m  Physical Exam  Constitutional: She is oriented to person, place, and time. She appears well-developed and well-nourished. No distress.  HENT:  Head: Normocephalic and atraumatic.  Eyes: No scleral icterus.  Neck: Neck supple.  Cardiovascular: Normal rate.  Pulmonary/Chest: Effort normal.  Abdominal: Soft.  Genitourinary:  Genitourinary Comments: BUS normal, vagina is pink and rugated, cervix is multiparous without lesion, uterus and adnexa could not be adequately outlined due to body  habitus   Neurological: She is alert and oriented to person, place, and time.  Skin: Skin is warm and dry.  Psychiatric: She has a normal mood and affect.   Procedure:Patient given informed consent, signed copy in the chart, time out was performed. Appropriate time out taken. . The patient was placed in the lithotomy position and the cervix brought into view with sterile speculum.  Portio of cervix cleansed x 2 with betadine swabs.  A tenaculum was placed in the anterior lip of the cervix.  The uterus was sounded for depth of 8  cm. A pipelle was introduced to into the uterus, suction created,  and an endometrial sample was obtained. All equipment was removed and accounted for.  The patient tolerated the procedure well.     Assessment & Plan:   Problem List Items Addressed This Visit      Unprioritized   Menorrhagia    Small uterus, with thickened lining and age > 40 needs EMB. Discussed options of treatment including OC's (not a good candidate due to age and co-morbidities, IUD, endometrial ablation, and hysterectomy. Patient strongly desires hysterectomy due to a sibling with leiomyosarcoma and removal of fibroids. Risks include but are not limited to bleeding, infection, injury to surrounding structures, including bowel, bladder and ureters, blood clots, and death.  Likelihood of success is high.       Relevant Orders   Surgical pathology    Other Visit Diagnoses    Screen for STD (sexually transmitted disease)    -  Primary   Relevant Orders   Hepatitis B surface antigen   Hepatitis C antibody   HIV antibody   RPR   Cervicovaginal ancillary only      Total face-to-face time with patient: 25 minutes. Over 50% of encounter was spent on counseling and coordination of care. Return in about 3 months (around 03/13/2017) for postop check.  Donnamae Jude 12/11/2016 9:11 AM

## 2016-12-11 NOTE — Patient Instructions (Signed)
Endometrial Biopsy, Care After This sheet gives you information about how to care for yourself after your procedure. Your health care provider may also give you more specific instructions. If you have problems or questions, contact your health care provider. What can I expect after the procedure? After the procedure, it is common to have:  Mild cramping.  A small amount of vaginal bleeding for a few days. This is normal.  Follow these instructions at home:  Take over-the-counter and prescription medicines only as told by your health care provider.  Do not douche, use tampons, or have sexual intercourse until your health care provider approves.  Return to your normal activities as told by your health care provider. Ask your health care provider what activities are safe for you.  Follow instructions from your health care provider about any activity restrictions, such as restrictions on strenuous exercise or heavy lifting. Contact a health care provider if:  You have heavy bleeding, or bleed for longer than 2 days after the procedure.  You have bad smelling discharge from your vagina.  You have a fever or chills.  You have a burning sensation when urinating or you have difficulty urinating.  You have severe pain in your lower abdomen. Get help right away if:  You have severe cramps in your stomach or back.  You pass large blood clots.  Your bleeding increases.  You become weak or light-headed, or you pass out. Summary  After the procedure, it is common to have mild cramping and a small amount of vaginal bleeding for a few days.  Do not douche, use tampons, or have sexual intercourse until your health care provider approves.  Return to your normal activities as told by your health care provider. Ask your health care provider what activities are safe for you. This information is not intended to replace advice given to you by your health care provider. Make sure you discuss any  questions you have with your health care provider. Document Released: 10/29/2012 Document Revised: 01/25/2016 Document Reviewed: 01/25/2016 Elsevier Interactive Patient Education  2017 [place]. Hysterectomy Information A hysterectomy is a surgery to remove your uterus. After surgery, you will no longer have periods. Also, you will not be able to get pregnant. Reasons for this surgery  You have bleeding that is not normal and keeps coming back.  You have lasting (chronic) lower belly (pelvic) pain.  You have a lasting infection.  The lining of your uterus grows outside your uterus.  The lining of your uterus grows in the muscle of your uterus.  Your uterus falls down into your vagina.  You have a growth in your uterus that causes problems.  You have cells that could turn into cancer (precancerous cells).  You have cancer of the uterus or cervix. Types There are 3 types of hysterectomies. Depending on the type, the surgery will:  Remove the top part of the uterus only.  Remove the uterus and the cervix.  Remove the uterus, cervix, and tissue that holds the uterus in place in the lower belly.  Ways a hysterectomy can be performed There are 5 ways this surgery can be performed.  A cut (incision) is made in the belly (abdomen). The uterus is taken out through the cut.  A cut is made in the vagina. The uterus is taken out through the cut.  Three or four cuts are made in the belly. A surgical device with a camera is put through one of the cuts. The uterus  is cut into small pieces. The uterus is taken out through the cuts or the vagina.  Three or four cuts are made in the belly. A surgical device with a camera is put through one of the cuts. The uterus is taken out through the vagina.  Three or four cuts are made in the belly. A surgical device that is controlled by a computer makes a visual image. The device helps the surgeon control the surgical tools. The uterus is cut  into small pieces. The pieces are taken out through the cuts or through the vagina.  What can I expect after the surgery?  You will be given pain medicine.  You will need help at home for 3-5 days after surgery.  You will need to see your doctor in 2-4 weeks after surgery.  You may get hot flashes, have night sweats, and have trouble sleeping.  You may need to have Pap tests in the future if your surgery was related to cancer. Talk to your doctor. It is still good to have regular exams. This information is not intended to replace advice given to you by your health care provider. Make sure you discuss any questions you have with your health care provider. Document Released: 04/02/2011 Document Revised: 06/16/2015 Document Reviewed: 09/15/2012 Elsevier Interactive Patient Education  [employer].

## 2016-12-11 NOTE — Assessment & Plan Note (Signed)
Small uterus, with thickened lining and age > 40 needs EMB. Discussed options of treatment including OC's (not a good candidate due to age and co-morbidities, IUD, endometrial ablation, and hysterectomy. Patient strongly desires hysterectomy due to a sibling with leiomyosarcoma and removal of fibroids. Risks include but are not limited to bleeding, infection, injury to surrounding structures, including bowel, bladder and ureters, blood clots, and death.  Likelihood of success is high.

## 2016-12-12 ENCOUNTER — Other Ambulatory Visit: Payer: Self-pay | Admitting: Family Medicine

## 2016-12-12 DIAGNOSIS — B9689 Other specified bacterial agents as the cause of diseases classified elsewhere: Secondary | ICD-10-CM

## 2016-12-12 DIAGNOSIS — N760 Acute vaginitis: Principal | ICD-10-CM

## 2016-12-12 LAB — CERVICOVAGINAL ANCILLARY ONLY
Bacterial vaginitis: POSITIVE — AB
CHLAMYDIA, DNA PROBE: NEGATIVE
Candida vaginitis: NEGATIVE
Neisseria Gonorrhea: NEGATIVE
TRICH (WINDOWPATH): NEGATIVE

## 2016-12-12 LAB — HEPATITIS C ANTIBODY: Hep C Virus Ab: <0.1 s/co ratio (ref 0.0–0.9)

## 2016-12-12 LAB — RPR: RPR Ser Ql: NONREACTIVE

## 2016-12-12 LAB — HIV ANTIBODY (ROUTINE TESTING W REFLEX): HIV SCREEN 4TH GENERATION: NONREACTIVE

## 2016-12-12 LAB — HEPATITIS B SURFACE ANTIGEN: Hepatitis B Surface Ag: NEGATIVE

## 2016-12-12 MED ORDER — METRONIDAZOLE 500 MG PO TABS: 500.0000 mg | ORAL_TABLET | Freq: Two times a day (BID) | ORAL | 0 refills | Status: DC

## 2016-12-17 ENCOUNTER — Ambulatory Visit: Payer: Medicaid Other

## 2016-12-17 ENCOUNTER — Ambulatory Visit
Admission: RE | Admit: 2016-12-17 | Discharge: 2016-12-17 | Disposition: A | Discharge status: Home / Self Care | Payer: Medicaid Other | Source: Ambulatory Visit | Attending: Family Medicine | Admitting: Family Medicine

## 2016-12-17 DIAGNOSIS — N644 Mastodynia: Secondary | ICD-10-CM

## 2016-12-17 DIAGNOSIS — N6320 Unspecified lump in the left breast, unspecified quadrant: Secondary | ICD-10-CM

## 2016-12-24 ENCOUNTER — Encounter (HOSPITAL_COMMUNITY): Payer: Self-pay

## 2017-01-24 ENCOUNTER — Encounter (HOSPITAL_COMMUNITY): Payer: Self-pay

## 2017-01-24 ENCOUNTER — Encounter (HOSPITAL_COMMUNITY)
Admission: RE | Admit: 2017-01-24 | Discharge: 2017-01-24 | Disposition: A | Discharge status: Home / Self Care | Payer: Medicaid Other | Source: Ambulatory Visit | Attending: Family Medicine | Admitting: Family Medicine

## 2017-01-24 ENCOUNTER — Other Ambulatory Visit: Payer: Self-pay

## 2017-01-24 DIAGNOSIS — Z01810 Encounter for preprocedural cardiovascular examination: Secondary | ICD-10-CM | POA: Insufficient documentation

## 2017-01-24 DIAGNOSIS — Z01818 Encounter for other preprocedural examination: Secondary | ICD-10-CM | POA: Diagnosis not present

## 2017-01-24 DIAGNOSIS — N921 Excessive and frequent menstruation with irregular cycle: Secondary | ICD-10-CM | POA: Diagnosis not present

## 2017-01-24 HISTORY — DX: Bipolar disorder, unspecified: F31.9

## 2017-01-24 HISTORY — DX: Anemia, unspecified: D64.9

## 2017-01-24 HISTORY — DX: Alcohol abuse, in remission: F10.11

## 2017-01-24 LAB — BASIC METABOLIC PANEL
ANION GAP: 8 (ref 5–15)
BUN: 12 mg/dL (ref 6–20)
CALCIUM: 8.7 mg/dL — AB (ref 8.9–10.3)
CO2: 20 mmol/L — ABNORMAL LOW (ref 22–32)
CREATININE: 0.99 mg/dL (ref 0.44–1.00)
Chloride: 111 mmol/L (ref 101–111)
GFR calc non Af Amer: >60 mL/min (ref >60)
Glucose, Bld: 101 mg/dL — ABNORMAL HIGH (ref 65–99)
Potassium: 3.9 mmol/L (ref 3.5–5.1)
SODIUM: 139 mmol/L (ref 135–145)

## 2017-01-24 LAB — CBC
HCT: 36.8 % (ref 36.0–46.0)
Hemoglobin: 12 g/dL (ref 12.0–15.0)
MCH: 25.9 pg — ABNORMAL LOW (ref 26.0–34.0)
MCHC: 32.6 g/dL (ref 30.0–36.0)
MCV: 79.5 fL (ref 78.0–100.0)
Platelets: 301 10*3/uL (ref 150–400)
RBC: 4.63 MIL/uL (ref 3.87–5.11)
RDW: 15.4 % (ref 11.5–15.5)
WBC: 7.3 10*3/uL (ref 4.0–10.5)

## 2017-01-24 NOTE — Patient Instructions (Addendum)
Your procedure is scheduled on:  Tuesday, Jan 8  Enter through the Main Entrance of [facility] at:  1:15 pm  Pick up the phone at the desk and dial [phone].  Call this number if you have problems the morning of surgery: [phone].  Remember: Do NOT eat food after midnight Monday  Do NOT drink clear liquids (including water) after: 8 am Tuesday, day of surgery  Take these medicines the morning of surgery with a SIP OF WATER: Amlodipine, wellbutrin, lamictal and acyclovir if needed.  Do Not smoke on the day of surgery.  Stop herbal medications and supplements at this time.  Do NOT wear jewelry (body piercing), metal hair clips/bobby pins, make-up, or nail polish. Do NOT wear lotions, powders, or perfumes.  You may wear deoderant. Do NOT shave for 48 hours prior to surgery. Do NOT bring valuables to the hospital.   Leave suitcase in car.  After surgery it may be brought to your room.  For patients admitted to the hospital, checkout time is 11:00 AM the day of discharge. Have a responsible adult drive you home and stay with you for 24 hours after your procedure.  Home with to be arranged prior to surgery.  Patient informed that she can not go home  by bus, taxi or [place].

## 2017-01-28 NOTE — H&P (Signed)
Tracy Rose is an 52 y.o. [phone] female.   Chief Complaint: menorrhagia HPI: [id number] who notes cycle has been heavier and regular. Cycles last 4 or 10 days. Notes very heavy bleeding.  Has had some anemia. Has not tried anything for bleeding. Taking geritol for anemia. Has h/o SVD x 2. Nml TSH and glucose testing. Has had office EMB which was normal.  Past Medical History:  Diagnosis Date  . Anemia   . Anxiety state   . Bipolar disorder ([place])   . Cervical strain, acute, subsequent encounter 05/29/2016  . Essential hypertension, benign 06/10/2015  . Genital herpes 12/12/2011  . GOITER, MULTINODULAR 09/05/2007   Dr. Zada [facility]  [place] 2011: Stable to slightly smaller bilateral thyroid nodules since 09/04/2007. No new or enlarging thyroid nodules identified. Biopsy 08/2007: non neoplastic goiter   . H/O ETOH abuse    Sober since 06/03/1992  . Hyperlipidemia 05/18/2016   Diet controlled, no meds  . Mastalgia in female 06/10/2015  . Mild depression ([place]) 12/15/2013  . OBESITY, NOS 03/21/2006       . SVD (spontaneous vaginal delivery)    x 1  . Tobacco abuse 09/08/2010  . Tobacco abuse     Past Surgical History:  Procedure Laterality Date  . BREAST BIOPSY Left 06/22/2015  . BREAST EXCISIONAL BIOPSY Left 09/21/2015  . BREAST LUMPECTOMY WITH RADIOACTIVE SEED LOCALIZATION Left 09/21/2015   Procedure: LEFT BREAST LUMPECTOMY WITH RADIOACTIVE SEED LOCALIZATION;  Surgeon: Donnie Mesa, MD;  Location: [place];  Service: General;  Laterality: Left;  . CERVICAL CERCLAGE     x 1  . RADIOLOGY WITH ANESTHESIA N/A 01/26/2014   Procedure: MRA NECK, MRI BRAIN WITH AND WITHOUT CONTRAST ;  Surgeon: Medication Radiologist, MD;  Location: [place];  Service: Radiology;  Laterality: N/A;  . WISDOM TOOTH EXTRACTION     @ age 55   OB History    Gravida Para Term Preterm AB Living   4 2 2  0 2 1   SAB TAB Ectopic Multiple Live Births   0 2 0 0        Family History  Problem Relation Age of  Onset  . Arthritis Mother   . Diabetes Father   . Arthritis Father   . Hypertension Father   . Hyperlipidemia Father   . Heart disease Father   . Stroke Father   . Cancer Maternal Grandfather   . Cancer Sister   . Migraines Neg Hx    Social History:  reports that she quit smoking 8 days ago. Her smoking use included cigarettes. She has a 6.25 pack-year smoking history. she has never used smokeless tobacco. She reports that she does not drink alcohol or use drugs.  Allergies:  Allergies  Allergen Reactions  . No Known Allergies     No medications prior to admission.    A comprehensive review of systems was negative.  There were no vitals taken for this visit. There were no vitals taken for this visit. General appearance: alert, cooperative and appears stated age Head: Normocephalic, without obvious abnormality, atraumatic Neck: supple, symmetrical, trachea midline Lungs: normal effort Heart: regular rate and rhythm, S1, S2 normal, no murmur, click, rub or gallop Abdomen: soft, non-tender; bowel sounds normal; no masses,  no organomegaly Extremities: extremities normal, atraumatic, no cyanosis or edema Skin: Skin color, texture, turgor normal. No rashes or lesions Neurologic: Grossly normal   Lab Results  Component Value Date   WBC 7.3 01/24/2017   HGB 12.0  01/24/2017   HCT 36.8 01/24/2017   MCV 79.5 01/24/2017   PLT 301 01/24/2017   Lab Results  Component Value Date   PREGTESTUR NEGATIVE 08/09/2015   PREGSERUM NEGATIVE 09/21/2015     Assessment/Plan Principal Problem:   Menorrhagia  For TVH with bilateral salpingectomy. Risks include but are not limited to bleeding, infection, injury to surrounding structures, including bowel, bladder and ureters, blood clots, and death.  Likelihood of success is high.    Donnamae Jude 01/28/2017, 5:42 PM

## 2017-01-29 ENCOUNTER — Encounter (HOSPITAL_COMMUNITY)
Admission: AD | Disposition: A | Discharge status: Home / Self Care | Payer: Self-pay | Source: Ambulatory Visit | Attending: Family Medicine

## 2017-01-29 ENCOUNTER — Ambulatory Visit (HOSPITAL_COMMUNITY): Payer: Medicaid Other | Admitting: Anesthesiology

## 2017-01-29 ENCOUNTER — Other Ambulatory Visit: Payer: Self-pay

## 2017-01-29 ENCOUNTER — Encounter (HOSPITAL_COMMUNITY): Payer: Self-pay | Admitting: Emergency Medicine

## 2017-01-29 ENCOUNTER — Ambulatory Visit (HOSPITAL_COMMUNITY)
Admission: AD | Admit: 2017-01-29 | Discharge: 2017-01-30 | Disposition: A | Discharge status: Home / Self Care | Payer: Medicaid Other | Source: Ambulatory Visit | Attending: Family Medicine | Admitting: Family Medicine

## 2017-01-29 ENCOUNTER — Encounter (HOSPITAL_COMMUNITY): Payer: Self-pay

## 2017-01-29 DIAGNOSIS — N72 Inflammatory disease of cervix uteri: Secondary | ICD-10-CM | POA: Insufficient documentation

## 2017-01-29 DIAGNOSIS — N800 Endometriosis of uterus: Secondary | ICD-10-CM | POA: Insufficient documentation

## 2017-01-29 DIAGNOSIS — E669 Obesity, unspecified: Secondary | ICD-10-CM | POA: Diagnosis not present

## 2017-01-29 DIAGNOSIS — D251 Intramural leiomyoma of uterus: Secondary | ICD-10-CM | POA: Insufficient documentation

## 2017-01-29 DIAGNOSIS — E785 Hyperlipidemia, unspecified: Secondary | ICD-10-CM | POA: Insufficient documentation

## 2017-01-29 DIAGNOSIS — Z90710 Acquired absence of both cervix and uterus: Secondary | ICD-10-CM

## 2017-01-29 DIAGNOSIS — A6000 Herpesviral infection of urogenital system, unspecified: Secondary | ICD-10-CM | POA: Diagnosis not present

## 2017-01-29 DIAGNOSIS — N920 Excessive and frequent menstruation with regular cycle: Secondary | ICD-10-CM | POA: Diagnosis present

## 2017-01-29 DIAGNOSIS — N888 Other specified noninflammatory disorders of cervix uteri: Secondary | ICD-10-CM | POA: Diagnosis not present

## 2017-01-29 DIAGNOSIS — Z87891 Personal history of nicotine dependence: Secondary | ICD-10-CM | POA: Diagnosis not present

## 2017-01-29 DIAGNOSIS — Z87892 Personal history of anaphylaxis: Secondary | ICD-10-CM | POA: Diagnosis not present

## 2017-01-29 DIAGNOSIS — Z6841 Body Mass Index (BMI) 40.0 and over, adult: Secondary | ICD-10-CM | POA: Insufficient documentation

## 2017-01-29 DIAGNOSIS — I10 Essential (primary) hypertension: Secondary | ICD-10-CM | POA: Diagnosis not present

## 2017-01-29 DIAGNOSIS — N921 Excessive and frequent menstruation with irregular cycle: Secondary | ICD-10-CM | POA: Diagnosis present

## 2017-01-29 DIAGNOSIS — Z79899 Other long term (current) drug therapy: Secondary | ICD-10-CM | POA: Insufficient documentation

## 2017-01-29 DIAGNOSIS — N840 Polyp of corpus uteri: Secondary | ICD-10-CM | POA: Insufficient documentation

## 2017-01-29 DIAGNOSIS — F319 Bipolar disorder, unspecified: Secondary | ICD-10-CM | POA: Insufficient documentation

## 2017-01-29 DIAGNOSIS — Z7982 Long term (current) use of aspirin: Secondary | ICD-10-CM | POA: Insufficient documentation

## 2017-01-29 HISTORY — PX: VAGINAL HYSTERECTOMY: SHX2639

## 2017-01-29 HISTORY — DX: Acquired absence of both cervix and uterus: Z90.710

## 2017-01-29 LAB — CBC
HEMATOCRIT: 37.3 % (ref 36.0–46.0)
HEMOGLOBIN: 12.1 g/dL (ref 12.0–15.0)
MCH: 26 pg (ref 26.0–34.0)
MCHC: 32.4 g/dL (ref 30.0–36.0)
MCV: 80.2 fL (ref 78.0–100.0)
Platelets: 294 10*3/uL (ref 150–400)
RBC: 4.65 MIL/uL (ref 3.87–5.11)
RDW: 15.2 % (ref 11.5–15.5)
WBC: 12.4 10*3/uL — AB (ref 4.0–10.5)

## 2017-01-29 LAB — PREGNANCY, URINE: Preg Test, Ur: NEGATIVE

## 2017-01-29 SURGERY — HYSTERECTOMY, VAGINAL
Anesthesia: General | Site: Vagina | Laterality: Bilateral

## 2017-01-29 MED ORDER — FENTANYL CITRATE (PF) 100 MCG/2ML IJ SOLN
INTRAMUSCULAR | Status: DC | PRN
  Administered 2017-01-29 (×2): 100 ug via INTRAVENOUS
  Administered 2017-01-29: 50 ug via INTRAVENOUS

## 2017-01-29 MED ORDER — NALOXONE HCL 0.4 MG/ML IJ SOLN: 0.4000 mg | INTRAMUSCULAR | Status: DC | PRN

## 2017-01-29 MED ORDER — PROPOFOL 10 MG/ML IV BOLUS
INTRAVENOUS | Status: DC | PRN
  Administered 2017-01-29: 200 mg via INTRAVENOUS

## 2017-01-29 MED ORDER — DIPHENHYDRAMINE HCL 50 MG/ML IJ SOLN: 12.5000 mg | Freq: Four times a day (QID) | INTRAMUSCULAR | Status: DC | PRN

## 2017-01-29 MED ORDER — SCOPOLAMINE 1 MG/3DAYS TD PT72
1.0000 | MEDICATED_PATCH | Freq: Once | TRANSDERMAL | Status: DC
  Administered 2017-01-29: 1.5 mg via TRANSDERMAL

## 2017-01-29 MED ORDER — FENTANYL CITRATE (PF) 100 MCG/2ML IJ SOLN
INTRAMUSCULAR | Status: AC
Start: 2017-01-29 — End: 2017-01-29
  Administered 2017-01-29: 50 ug via INTRAVENOUS
  Filled 2017-01-29: qty 2

## 2017-01-29 MED ORDER — ONDANSETRON HCL 4 MG/2ML IJ SOLN: 4.0000 mg | Freq: Four times a day (QID) | INTRAMUSCULAR | Status: DC | PRN

## 2017-01-29 MED ORDER — ROCURONIUM BROMIDE 100 MG/10ML IV SOLN
INTRAVENOUS | Status: AC
  Filled 2017-01-29: qty 1

## 2017-01-29 MED ORDER — ESTRADIOL 0.1 MG/GM VA CREA
TOPICAL_CREAM | VAGINAL | Status: AC
Start: 2017-01-29 — End: 2017-01-29
  Filled 2017-01-29: qty 42.5

## 2017-01-29 MED ORDER — SODIUM CHLORIDE 0.9% FLUSH: 9.0000 mL | INTRAVENOUS | Status: DC | PRN

## 2017-01-29 MED ORDER — LAMOTRIGINE 100 MG PO TABS
100.0000 mg | ORAL_TABLET | Freq: Two times a day (BID) | ORAL | Status: DC
  Filled 2017-01-29 (×5): qty 1

## 2017-01-29 MED ORDER — ROCURONIUM BROMIDE 100 MG/10ML IV SOLN
INTRAVENOUS | Status: DC | PRN
  Administered 2017-01-29: 40 mg via INTRAVENOUS

## 2017-01-29 MED ORDER — MIDAZOLAM HCL 2 MG/2ML IJ SOLN
INTRAMUSCULAR | Status: AC
  Filled 2017-01-29: qty 2

## 2017-01-29 MED ORDER — DIPHENHYDRAMINE HCL 12.5 MG/5ML PO ELIX
12.5000 mg | ORAL_SOLUTION | Freq: Four times a day (QID) | ORAL | Status: DC | PRN
  Administered 2017-01-30: 12.5 mg via ORAL
  Filled 2017-01-29: qty 5

## 2017-01-29 MED ORDER — DEXAMETHASONE SODIUM PHOSPHATE 10 MG/ML IJ SOLN
INTRAMUSCULAR | Status: DC | PRN
  Administered 2017-01-29: 10 mg via INTRAVENOUS

## 2017-01-29 MED ORDER — SCOPOLAMINE 1 MG/3DAYS TD PT72
MEDICATED_PATCH | TRANSDERMAL | Status: AC
  Administered 2017-01-29: 1.5 mg via TRANSDERMAL
  Filled 2017-01-29: qty 1

## 2017-01-29 MED ORDER — VITAMIN D3 25 MCG (1000 UNIT) PO TABS
2000.0000 [IU] | ORAL_TABLET | Freq: Every day | ORAL | Status: DC
  Administered 2017-01-30: 2000 [IU] via ORAL
  Filled 2017-01-29 (×3): qty 2

## 2017-01-29 MED ORDER — DEXAMETHASONE SODIUM PHOSPHATE 10 MG/ML IJ SOLN
INTRAMUSCULAR | Status: AC
  Filled 2017-01-29: qty 1

## 2017-01-29 MED ORDER — DEXTROSE IN LACTATED RINGERS 5 % IV SOLN
INTRAVENOUS | Status: DC
  Administered 2017-01-29: 15:00:00 via INTRAVENOUS

## 2017-01-29 MED ORDER — MENTHOL 3 MG MT LOZG: 1.0000 | LOZENGE | OROMUCOSAL | Status: DC | PRN

## 2017-01-29 MED ORDER — BUPROPION HCL ER (XL) 150 MG PO TB24: 150.0000 mg | ORAL_TABLET | Freq: Two times a day (BID) | ORAL | Status: DC

## 2017-01-29 MED ORDER — TRAZODONE HCL 50 MG PO TABS
50.0000 mg | ORAL_TABLET | Freq: Every day | ORAL | Status: DC
  Administered 2017-01-30: 50 mg via ORAL
  Filled 2017-01-29 (×2): qty 1

## 2017-01-29 MED ORDER — ONDANSETRON HCL 4 MG/2ML IJ SOLN
INTRAMUSCULAR | Status: AC
  Filled 2017-01-29: qty 2

## 2017-01-29 MED ORDER — KETOROLAC TROMETHAMINE 30 MG/ML IJ SOLN: 30.0000 mg | Freq: Four times a day (QID) | INTRAMUSCULAR | Status: DC

## 2017-01-29 MED ORDER — MIDAZOLAM HCL 2 MG/2ML IJ SOLN
INTRAMUSCULAR | Status: DC | PRN
  Administered 2017-01-29: 2 mg via INTRAVENOUS

## 2017-01-29 MED ORDER — LIDOCAINE-EPINEPHRINE 1 %-1:100000 IJ SOLN
INTRAMUSCULAR | Status: AC
Start: 2017-01-29 — End: 2017-01-29
  Filled 2017-01-29: qty 1

## 2017-01-29 MED ORDER — OXYCODONE HCL 5 MG PO TABS
5.0000 mg | ORAL_TABLET | ORAL | Status: DC | PRN
  Administered 2017-01-29 (×2): 10 mg via ORAL
  Administered 2017-01-30 (×2): 5 mg via ORAL
  Filled 2017-01-29: qty 1
  Filled 2017-01-29: qty 2
  Filled 2017-01-29: qty 1
  Filled 2017-01-29: qty 2

## 2017-01-29 MED ORDER — KETOROLAC TROMETHAMINE 30 MG/ML IJ SOLN: 30.0000 mg | Freq: Once | INTRAMUSCULAR | Status: AC

## 2017-01-29 MED ORDER — LACTATED RINGERS IV SOLN
INTRAVENOUS | Status: DC | PRN
  Administered 2017-01-29 (×2): via INTRAVENOUS

## 2017-01-29 MED ORDER — IBUPROFEN 600 MG PO TABS
600.0000 mg | ORAL_TABLET | Freq: Four times a day (QID) | ORAL | Status: DC | PRN
  Administered 2017-01-30: 600 mg via ORAL
  Filled 2017-01-29: qty 1

## 2017-01-29 MED ORDER — ACYCLOVIR 400 MG PO TABS
400.0000 mg | ORAL_TABLET | Freq: Two times a day (BID) | ORAL | Status: DC
  Filled 2017-01-29 (×5): qty 1

## 2017-01-29 MED ORDER — SUCCINYLCHOLINE CHLORIDE 20 MG/ML IJ SOLN
INTRAMUSCULAR | Status: DC | PRN
  Administered 2017-01-29: 140 mg via INTRAVENOUS

## 2017-01-29 MED ORDER — DEXTROSE 5 % IV SOLN
3.0000 g | INTRAVENOUS | Status: AC
  Administered 2017-01-29: 3 g via INTRAVENOUS
  Filled 2017-01-29: qty 3000

## 2017-01-29 MED ORDER — PHENYLEPHRINE HCL 10 MG/ML IJ SOLN
INTRAMUSCULAR | Status: DC | PRN
  Administered 2017-01-29 (×3): .08 mg via INTRAVENOUS

## 2017-01-29 MED ORDER — LACTATED RINGERS IV SOLN
INTRAVENOUS | Status: DC
  Administered 2017-01-29: 10:00:00 via INTRAVENOUS

## 2017-01-29 MED ORDER — KETOROLAC TROMETHAMINE 30 MG/ML IJ SOLN
INTRAMUSCULAR | Status: AC
  Administered 2017-01-29: 30 mg via INTRAVENOUS
  Filled 2017-01-29: qty 1

## 2017-01-29 MED ORDER — SUGAMMADEX SODIUM 500 MG/5ML IV SOLN
INTRAVENOUS | Status: DC | PRN
  Administered 2017-01-29: 300 mg via INTRAVENOUS

## 2017-01-29 MED ORDER — ACETAMINOPHEN 10 MG/ML IV SOLN
1000.0000 mg | Freq: Four times a day (QID) | INTRAVENOUS | Status: DC
  Administered 2017-01-29: 1000 mg via INTRAVENOUS

## 2017-01-29 MED ORDER — ONDANSETRON HCL 4 MG/2ML IJ SOLN
INTRAMUSCULAR | Status: DC | PRN
  Administered 2017-01-29: 4 mg via INTRAVENOUS

## 2017-01-29 MED ORDER — KETOROLAC TROMETHAMINE 30 MG/ML IJ SOLN
30.0000 mg | Freq: Four times a day (QID) | INTRAMUSCULAR | Status: DC
  Administered 2017-01-29 (×3): 30 mg via INTRAVENOUS
  Filled 2017-01-29 (×3): qty 1

## 2017-01-29 MED ORDER — PHENYLEPHRINE 40 MCG/ML (10ML) SYRINGE FOR IV PUSH (FOR BLOOD PRESSURE SUPPORT)
PREFILLED_SYRINGE | INTRAVENOUS | Status: AC
  Filled 2017-01-29: qty 10

## 2017-01-29 MED ORDER — ACETAMINOPHEN 10 MG/ML IV SOLN
INTRAVENOUS | Status: AC
  Filled 2017-01-29: qty 100

## 2017-01-29 MED ORDER — AMLODIPINE BESYLATE 5 MG PO TABS
5.0000 mg | ORAL_TABLET | Freq: Every day | ORAL | Status: DC
  Administered 2017-01-30: 5 mg via ORAL
  Filled 2017-01-29: qty 1

## 2017-01-29 MED ORDER — PROPOFOL 10 MG/ML IV BOLUS
INTRAVENOUS | Status: AC
Start: 2017-01-29 — End: 2017-01-29
  Filled 2017-01-29: qty 20

## 2017-01-29 MED ORDER — SUGAMMADEX SODIUM 500 MG/5ML IV SOLN
INTRAVENOUS | Status: AC
  Filled 2017-01-29: qty 5

## 2017-01-29 MED ORDER — BUPROPION HCL ER (SR) 150 MG PO TB12
150.0000 mg | ORAL_TABLET | ORAL | Status: DC
  Filled 2017-01-29 (×5): qty 1

## 2017-01-29 MED ORDER — LIDOCAINE HCL (CARDIAC) 20 MG/ML IV SOLN
INTRAVENOUS | Status: DC | PRN
  Administered 2017-01-29: 50 mg via INTRAVENOUS

## 2017-01-29 MED ORDER — LIDOCAINE-EPINEPHRINE 1 %-1:100000 IJ SOLN
INTRAMUSCULAR | Status: DC | PRN
  Administered 2017-01-29: 20 mL

## 2017-01-29 MED ORDER — FENTANYL CITRATE (PF) 100 MCG/2ML IJ SOLN
25.0000 ug | INTRAMUSCULAR | Status: DC | PRN
  Administered 2017-01-29: 50 ug via INTRAVENOUS
  Administered 2017-01-29 (×2): 25 ug via INTRAVENOUS

## 2017-01-29 MED ORDER — HYDROCHLOROTHIAZIDE 25 MG PO TABS
25.0000 mg | ORAL_TABLET | Freq: Every day | ORAL | Status: DC
  Administered 2017-01-30: 25 mg via ORAL
  Filled 2017-01-29: qty 1

## 2017-01-29 MED ORDER — FENTANYL CITRATE (PF) 250 MCG/5ML IJ SOLN
INTRAMUSCULAR | Status: AC
  Filled 2017-01-29: qty 5

## 2017-01-29 MED ORDER — LIDOCAINE HCL (CARDIAC) 20 MG/ML IV SOLN
INTRAVENOUS | Status: AC
  Filled 2017-01-29: qty 5

## 2017-01-29 MED ORDER — HYDROMORPHONE 1 MG/ML IV SOLN
INTRAVENOUS | Status: DC
  Administered 2017-01-29: 13:00:00 via INTRAVENOUS
  Filled 2017-01-29: qty 25

## 2017-01-29 SURGICAL SUPPLY — 27 items
CANISTER SUCT 3000ML PPV (MISCELLANEOUS) ×3 IMPLANT
CLOTH BEACON ORANGE TIMEOUT ST (SAFETY) ×3 IMPLANT
CONT PATH 16OZ SNAP LID 3702 (MISCELLANEOUS) ×2 IMPLANT
DECANTER SPIKE VIAL GLASS SM (MISCELLANEOUS) ×2 IMPLANT
GAUZE PACKING 2X5 YD STRL (GAUZE/BANDAGES/DRESSINGS) ×2 IMPLANT
GLOVE BIOGEL PI IND STRL 6.5 (GLOVE) ×1 IMPLANT
GLOVE BIOGEL PI IND STRL 7.0 (GLOVE) ×2 IMPLANT
GLOVE BIOGEL PI INDICATOR 6.5 (GLOVE) ×2
GLOVE BIOGEL PI INDICATOR 7.0 (GLOVE) ×4
GLOVE ECLIPSE 7.0 STRL STRAW (GLOVE) ×6 IMPLANT
GOWN STRL REUS W/TWL LRG LVL3 (GOWN DISPOSABLE) ×15 IMPLANT
NDL SPNL 18GX3.5 QUINCKE PK (NEEDLE) ×1 IMPLANT
NEEDLE HYPO 22GX1.5 SAFETY (NEEDLE) IMPLANT
NEEDLE SPNL 18GX3.5 QUINCKE PK (NEEDLE) ×3 IMPLANT
NS IRRIG 1000ML POUR BTL (IV SOLUTION) ×3 IMPLANT
PACK TRENDGUARD 600 HYBRD PROC (MISCELLANEOUS) IMPLANT
PACK VAGINAL WOMENS (CUSTOM PROCEDURE TRAY) ×3 IMPLANT
PAD OB MATERNITY 4.3X12.25 (PERSONAL CARE ITEMS) ×3 IMPLANT
SUT VIC AB 0 CT1 18XCR BRD8 (SUTURE) ×3 IMPLANT
SUT VIC AB 0 CT1 27 (SUTURE) ×3
SUT VIC AB 0 CT1 27XBRD ANBCTR (SUTURE) ×2 IMPLANT
SUT VIC AB 0 CT1 8-18 (SUTURE) ×3
SUT VICRYL 0 TIES 12 18 (SUTURE) ×3 IMPLANT
SYR 20CC LL (SYRINGE) ×3 IMPLANT
TOWEL OR 17X24 6PK STRL BLUE (TOWEL DISPOSABLE) ×6 IMPLANT
TRAY FOLEY CATH SILVER 14FR (SET/KITS/TRAYS/PACK) ×3 IMPLANT
TRENDGUARD 600 HYBRID PROC PK (MISCELLANEOUS)

## 2017-01-29 NOTE — Op Note (Signed)
Preoperative diagnosis: Menorrhagia, fibroid uterus  Postoperative diagnosis: Same  Procedure: Transvaginal hysterectomy and bilateral salpingectomy  Surgeon: Standley Dakins. Kennon Rounds, M.D.  Assistant: Lavonia Drafts, MD  Anesthesia: Freeman Caldron, MD, MD  Findings: fibroid uterus, nml tubes  Estimated blood loss: 100 cc  Specimen: Uterus and tubes to pathology  Reason for procedure: Patient had long h/o bleeding and was noted to have fibroids. The patient has a sister with leiomyosarcoma and desired definitive treatment.  Risks of  hysterectomy reviewed.  Risks include but are not limited to bleeding, infection, injury to surrounding structures, including bowel, bladder and ureters, blood clots, and death.  Likelihood of success of surgery is high.   Procedure: Patient was taken to the OR where she was placed in dorsal lithotomy in [place]. She was prepped and draped in the usual sterile fashion. A timeout was performed. The patient received 3 g of Ancef prior to procedure. The patient had SCDs in place.  A speculum was placed inside the vagina. The cervix was visualized and grasped with 2 doublle-tooth tenacula. 20 cc of 1% lidocaine with epinephrine were injected paracervically. A knife was used to make a circumferential incision around the vagina. An opened sponge was used to dissect the vagina off the cervix. The posterior peritoneum was entered sharply with Mayo scissors. The posterior peritoneum was tagged to the vaginal cuff with a single stitch. The anterior peritoneal cavity was entered sharply with careful dissection of the bladder off the underlying cervix. A Heaney clamp was used to clamp first the left uterosacral ligament and cardinal which was then cut and Haney suture ligated with 0 Vicryl stitch, the stitch was held. Similarly the right uterosacral ligament was clamped cut and suture ligated.. Sequential bites up the broad to the uterine arteries were taken  until the tubo-ovarian pedicles were encountered. The uterus was then inverted and the left utero-ovarian pedicle grasped with a Heaney clamp. The right utero-ovarian pedicle was similarly grasped with the Heaney clamp. Both tubes were found grasped with a Babcock clamp and followed to their fimbriated ends. They were clamped with a Kelly clamp cut and the pedicle secured with a  free tie. The vagina was closed with 0 Vicryl suture in a locked running fashion with care taken to incorporate the uterosacral pedicles. Excellent hemostasis was noted at the end of the case. The vaginal cuff was inspected there was minimal bleeding noted.  A Foley catheter is placed inside her bladder. Clear, yellow urine was noted. All instrument needle and lap counts were correct x 2. Patient was awakened taken to recovery room in stable condition.  Donnamae Jude, MD 01/29/2017, 11:34 AM

## 2017-01-29 NOTE — Interval H&P Note (Signed)
History and Physical Interval Note:  01/29/2017 10:11 AM  Tracy Rose  has presented today for surgery, with the diagnosis of Menorrhagia  The various methods of treatment have been discussed with the patient and family. After consideration of risks, benefits and other options for treatment, the patient has consented to  Procedure(s): HYSTERECTOMY VAGINAL WITH SALPINGECTOMY (Bilateral) as a surgical intervention .  The patient's history has been reviewed, patient examined, no change in status, stable for surgery.  I have reviewed the patient's chart and labs.  Questions were answered to the patient's satisfaction.     Tracy Rose

## 2017-01-29 NOTE — Anesthesia Procedure Notes (Signed)
Procedure Name: Intubation Date/Time: 01/29/2017 10:46 AM Performed by: Purvis Kilts, CRNA Pre-anesthesia Checklist: Patient identified, Emergency Drugs available, Suction available, Patient being monitored and Timeout performed Patient Re-evaluated:Patient Re-evaluated prior to induction Oxygen Delivery Method: Circle system utilized Preoxygenation: Pre-oxygenation with 100% oxygen Induction Type: IV induction Ventilation: Mask ventilation without difficulty Laryngoscope Size: Glidescope Tube type: Oral Tube size: 7.0 mm Number of attempts: 1 Airway Equipment and Method: Stylet Placement Confirmation: ETT inserted through vocal cords under direct vision,  positive ETCO2 and breath sounds checked- equal and bilateral Secured at: 22 cm Tube secured with: Tape Dental Injury: Teeth and Oropharynx as per pre-operative assessment

## 2017-01-29 NOTE — Anesthesia Postprocedure Evaluation (Signed)
Anesthesia Post Note  Patient: Tracy Rose  Procedure(s) Performed: HYSTERECTOMY VAGINAL WITH SALPINGECTOMY (Bilateral Vagina )     Patient location during evaluation: PACU Anesthesia Type: General Level of consciousness: awake and alert Pain management: pain level controlled Vital Signs Assessment: post-procedure vital signs reviewed and stable Respiratory status: spontaneous breathing, nonlabored ventilation, respiratory function stable and patient connected to nasal cannula oxygen Cardiovascular status: blood pressure returned to baseline and stable Postop Assessment: no apparent nausea or vomiting Anesthetic complications: no    Last Vitals:  Vitals:   01/29/17 1315 01/29/17 1407  BP:  139/83  Pulse:  79  Resp: 16 16  Temp:  37.1 C  SpO2: 95% 98%    Last Pain:  Vitals:   01/29/17 1407  TempSrc: Oral  PainSc:    Pain Goal: Patients Stated Pain Goal: 3 (01/29/17 1400)               [name]

## 2017-01-29 NOTE — Transfer of Care (Signed)
Immediate Anesthesia Transfer of Care Note  Patient: Tracy Rose  Procedure(s) Performed: HYSTERECTOMY VAGINAL WITH SALPINGECTOMY (Bilateral Vagina )  Patient Location: PACU  Anesthesia Type:General  Level of Consciousness: awake  Airway & Oxygen Therapy: Patient Spontanous Breathing and Patient connected to nasal cannula oxygen  Post-op Assessment: Report given to RN and Post -op Vital signs reviewed and stable  Post vital signs: Reviewed and stable  Last Vitals:  Vitals:   01/29/17 0958  BP: (!) 130/96  Pulse: 95  Resp: 16  Temp: 36.7 C  SpO2: 99%    Last Pain:  Vitals:   01/29/17 0958  TempSrc: Oral      Patients Stated Pain Goal: 3 (94/07/68 0881)  Complications: No apparent anesthesia complications

## 2017-01-29 NOTE — Plan of Care (Signed)
Pt. Moving forward with post operative goals. Encouragement and set boundaries needed. Pt. Consented to foley and packing removal. Anxiety present but procedure explained and pt. Ok. Pain goal presently met. Will continue to assess. POC reviewed with patient. Will monitor and continue to give reassurance and move forward with post op care.

## 2017-01-29 NOTE — Anesthesia Preprocedure Evaluation (Signed)
Anesthesia Evaluation  Patient identified by MRN, date of birth, ID band Patient awake    Reviewed: Allergy & Precautions, H&P , Patient's Chart, lab work & pertinent test results, reviewed documented beta blocker date and time   Airway Mallampati: II  TM Distance: >3 FB Neck ROM: full    Dental no notable dental hx.    Pulmonary former smoker,    Pulmonary exam normal breath sounds clear to auscultation       Cardiovascular hypertension,  Rhythm:regular Rate:Normal     Neuro/Psych    GI/Hepatic   Endo/Other  Morbid obesity  Renal/GU      Musculoskeletal   Abdominal   Peds  Hematology   Anesthesia Other Findings   Reproductive/Obstetrics                             Anesthesia Physical Anesthesia Plan  ASA: III  Anesthesia Plan: General   Post-op Pain Management:    Induction: Intravenous  PONV Risk Score and Plan: 3 and Dexamethasone, Ondansetron and Scopolamine patch - Pre-op  Airway Management Planned: Oral ETT and Video Laryngoscope Planned  Additional Equipment:   Intra-op Plan:   Post-operative Plan: Extubation in OR  Informed Consent: I have reviewed the patients History and Physical, chart, labs and discussed the procedure including the risks, benefits and alternatives for the proposed anesthesia with the patient or authorized representative who has indicated his/her understanding and acceptance.   Dental Advisory Given  Plan Discussed with: CRNA and Surgeon  Anesthesia Plan Comments: (  )        Anesthesia Quick Evaluation

## 2017-01-29 NOTE — Discharge Instructions (Signed)
Vaginal Hysterectomy, Care After °Refer to this sheet in the next few weeks. These instructions provide you with information about caring for yourself after your procedure. Your health care provider may also give you more specific instructions. Your treatment has been planned according to current medical practices, but problems sometimes occur. Call your health care provider if you have any problems or questions after your procedure. °What can I expect after the procedure? °After the procedure, it is common to have: °· Pain. °· Soreness and numbness in your incision areas. °· Vaginal bleeding and discharge. °· Constipation. °· Temporary problems emptying the bladder. °· Feelings of sadness or other emotions. ° °Follow these instructions at home: °Medicines °· Take over-the-counter and prescription medicines only as told by your health care provider. °· If you were prescribed an antibiotic medicine, take it as told by your health care provider. Do not stop taking the antibiotic even if you start to feel better. °· Do not drive or operate heavy machinery while taking prescription pain medicine. °Activity °· Return to your normal activities as told by your health care provider. Ask your health care provider what activities are safe for you. °· Get regular exercise as told by your health care provider. You may be told to take short walks every day and go farther each time. °· Do not lift anything that is heavier than 10 lb (4.5 kg). °General instructions ° °· Do not put anything in your vagina for 6 weeks after your surgery or as told by your health care provider. This includes tampons and douches. °· Do not have sex until your health care provider says you can. °· Do not take baths, swim, or use a hot tub until your health care provider approves. °· Drink enough fluid to keep your urine clear or pale yellow. °· Do not drive for 24 hours if you were given a sedative. °· Keep all follow-up visits as told by your health  care provider. This is important. °Contact a health care provider if: °· Your pain medicine is not helping. °· You have a fever. °· You have redness, swelling, or pain at your incision site. °· You have blood, pus, or a bad-smelling discharge from your vagina. °· You continue to have difficulty urinating. °Get help right away if: °· You have severe abdominal or back pain. °· You have heavy bleeding from your vagina. °· You have chest pain or shortness of breath. °This information is not intended to replace advice given to you by your health care provider. Make sure you discuss any questions you have with your health care provider. °Document Released: 05/02/2015 Document Revised: 06/16/2015 Document Reviewed: 01/23/2015 °Elsevier Interactive Patient Education © 2018 Elsevier Inc. ° °

## 2017-01-29 NOTE — Progress Notes (Addendum)
Patient refusing to allow her foley cath and vaginal packing to be removed. Patient states "its too much" and she doesn't want anyone touching her "playstation". Reason and importance of removing both dicussed. Patient still refused. RN will attempt again when patient is more calm.

## 2017-01-30 ENCOUNTER — Encounter (HOSPITAL_COMMUNITY): Payer: Self-pay | Admitting: Family Medicine

## 2017-01-30 DIAGNOSIS — N920 Excessive and frequent menstruation with regular cycle: Secondary | ICD-10-CM | POA: Diagnosis not present

## 2017-01-30 MED ORDER — OXYCODONE-ACETAMINOPHEN 5-325 MG PO TABS: 1.0000 | ORAL_TABLET | Freq: Four times a day (QID) | ORAL | 0 refills | Status: DC | PRN

## 2017-01-30 NOTE — Plan of Care (Signed)
POC discussed with pt including DC education and pain management post op day 1.

## 2017-01-30 NOTE — Progress Notes (Signed)
Discharge education given per MD order, discharge papers signed, pt in room waiting for ride.

## 2017-01-30 NOTE — Discharge Summary (Signed)
Physician Discharge Summary  Patient ID: Tracy Rose MRN: [id number] DOB/AGE: August 20, 1965 52 y.o.  Admit date: 01/29/2017 Discharge date:   Admission Diagnoses:  Principal Problem:   Menorrhagia Active Problems:   S/P vaginal hysterectomy   Discharge Diagnoses:  Same  Past Medical History:  Diagnosis Date  . Anemia   . Anxiety state   . Bipolar disorder ([place])   . Cervical strain, acute, subsequent encounter 05/29/2016  . Essential hypertension, benign 06/10/2015  . Genital herpes 12/12/2011  . GOITER, MULTINODULAR 09/05/2007   Dr. Zada Girt [facility]  [place] 2011: Stable to slightly smaller bilateral thyroid nodules since 09/04/2007. No new or enlarging thyroid nodules identified. Biopsy 08/2007: non neoplastic goiter   . H/O ETOH abuse    Sober since 06/03/1992  . Hyperlipidemia 05/18/2016   Diet controlled, no meds  . Mastalgia in female 06/10/2015  . Mild depression ([place]) 12/15/2013  . OBESITY, NOS 03/21/2006       . SVD (spontaneous vaginal delivery)    x 1  . Tobacco abuse 09/08/2010  . Tobacco abuse     Surgeries: Procedure(s): HYSTERECTOMY VAGINAL WITH SALPINGECTOMY on 01/29/2017   Discharged Condition: Stable  Hospital Course: Tracy Rose is an 52 y.o. female [id number] who was admitted 01/29/2017 with a  diagnosis of Menorrhagia.  They were brought to the operating room on 01/29/2017 and underwent the above named procedures.    They were given perioperative antibiotics:  Anti-infectives (From admission, onward)   Start     Dose/Rate Route Frequency Ordered Stop   01/29/17 1300  acyclovir (ZOVIRAX) tablet 400 mg     400 mg Oral 2 times daily 01/29/17 1249     01/29/17 0600  ceFAZolin (ANCEF) 3 g in dextrose 5 % 50 mL IVPB     3 g 130 mL/hr over 30 Minutes Intravenous On call to O.R. 01/29/17 [id number] 01/29/17 1039    .  They were given sequential compression devices, early ambulation, and chemoprophylaxis for DVT prophylaxis. She was ambulating, voiding and  tolerating PO. Her VS were stable and Hgb stable.  They benefited maximally from their hospital stay and there were no complications.    Recent vital signs:  Vitals:   01/29/17 2330 01/30/17 0340  BP: 133/69 117/69  Pulse: 83 93  Resp: 19 19  Temp: 98.3 F (36.8 C) 98.4 F (36.9 C)  SpO2: 95% 99%    Recent laboratory studies:  Results for orders placed or performed during the hospital encounter of 01/29/17  Pregnancy, urine  Result Value Ref Range   Preg Test, Ur NEGATIVE NEGATIVE  CBC  Result Value Ref Range   WBC 12.4 (H) 4.0 - 10.5 K/uL   RBC 4.65 3.87 - 5.11 MIL/uL   Hemoglobin 12.1 12.0 - 15.0 g/dL   HCT 37.3 36.0 - 46.0 %   MCV 80.2 78.0 - 100.0 fL   MCH 26.0 26.0 - 34.0 pg   MCHC 32.4 30.0 - 36.0 g/dL   RDW 15.2 11.5 - 15.5 %   Platelets 294 150 - 400 K/uL    Discharge Medications:   Allergies as of 01/30/2017      Reactions   No Known Allergies       Medication List    TAKE these medications   acyclovir 400 MG tablet Commonly known as:  ZOVIRAX TAKE 1 TABLET(400 MG) BY MOUTH TWICE DAILY What changed:  See the new instructions.   amLODipine 5 MG tablet Commonly known as:  NORVASC TAKE 1 TABLET(5  MG) BY MOUTH DAILY   aspirin 81 MG chewable tablet Chew 81 mg by mouth daily as needed for mild pain.   buPROPion 150 MG 12 hr tablet Commonly known as:  WELLBUTRIN SR Take 150 mg by mouth 2 (two) times daily. Take 1 tablet in the morning and 1 tablet at 2pm   cholecalciferol 1000 units tablet Commonly known as:  VITAMIN D Take 2,000 Units by mouth daily.   ferrous sulfate 325 (65 FE) MG tablet Take 1 tablet (325 mg total) by mouth 2 (two) times daily with a meal.   hydrochlorothiazide 25 MG tablet Commonly known as:  HYDRODIURIL Take 1 tablet (25 mg total) by mouth daily.   ibuprofen 800 MG tablet Commonly known as:  ADVIL,MOTRIN TAKE 1 TABLET(800 MG) BY MOUTH EVERY 8 HOURS AS NEEDED FOR MILD PAIN OR MODERATE PAIN   lamoTRIgine 100 MG  tablet Commonly known as:  LAMICTAL Take 100 mg by mouth 2 (two) times daily.   oxyCODONE-acetaminophen 5-325 MG tablet Commonly known as:  PERCOCET/ROXICET Take 1-2 tablets by mouth every 6 (six) hours as needed.   traZODone 50 MG tablet Commonly known as:  DESYREL TAKE 1 TABLET(50 MG) BY MOUTH AT BEDTIME AS NEEDED FOR SLEEP   Vitamin B-12 5000 MCG Tbdp Take 10,000 mcg by mouth daily.       Diagnostic Studies: No results found.  Disposition: 01-Home or Self Care  Discharge Instructions    Call MD for:  persistant nausea and vomiting   Complete by:  As directed    Call MD for:  redness, tenderness, or signs of infection (pain, swelling, redness, odor or green/yellow discharge around incision site)   Complete by:  As directed    Call MD for:  severe uncontrolled pain   Complete by:  As directed    Call MD for:  temperature >100.4   Complete by:  As directed    Diet - low sodium heart healthy   Complete by:  As directed    Driving Restrictions   Complete by:  As directed    None while taking narcotic pain meds   Increase activity slowly   Complete by:  As directed    Lifting restrictions   Complete by:  As directed    Nothing > 20 lbs x 6 wks   Other Restrictions   Complete by:  As directed    Nothing in the vagina and no baths, showers only   Sexual Activity Restrictions   Complete by:  As directed    None until cleared by MD      [facility].   Specialty:  Obstetrics and Gynecology Why:  postop check, they will call you with an appointment Contact information: [place] [phone]           Signed: Donnamae Jude 01/30/2017, 8:03 AM

## 2017-01-31 ENCOUNTER — Telehealth: Payer: Self-pay | Admitting: General Practice

## 2017-01-31 NOTE — Telephone Encounter (Signed)
Called patient to let her know of post-op appointment on 02/20/17 with Dr. Ihor Dow at 9:35am and 03/07/17 at 1:55pm.  Asked patient to give our office a call to discuss appointments.

## 2017-02-04 ENCOUNTER — Other Ambulatory Visit: Payer: Self-pay | Admitting: Family Medicine

## 2017-02-04 DIAGNOSIS — S161XXD Strain of muscle, fascia and tendon at neck level, subsequent encounter: Secondary | ICD-10-CM

## 2017-02-05 MED ORDER — IBUPROFEN 600 MG PO TABS: 600.0000 mg | ORAL_TABLET | Freq: Three times a day (TID) | ORAL | 0 refills | Status: DC | PRN

## 2017-02-20 ENCOUNTER — Encounter: Payer: Self-pay | Admitting: Obstetrics & Gynecology

## 2017-02-20 ENCOUNTER — Ambulatory Visit (INDEPENDENT_AMBULATORY_CARE_PROVIDER_SITE_OTHER): Payer: Medicaid Other | Admitting: Obstetrics & Gynecology

## 2017-02-20 VITALS — BP 144/97 | HR 97 | Wt 289.3 lb

## 2017-02-20 DIAGNOSIS — Z9889 Other specified postprocedural states: Secondary | ICD-10-CM

## 2017-02-20 NOTE — Progress Notes (Signed)
History:  52 y.o. [id number] here today for a 3 week post op check. She is s/p a TVH with bilateral salpingectomy on 01/29/2017.  She denies pain or problems. 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 She says that she never runs or does any excessive activity at work.  She reports no problems with passing stool or urine. She stopped taking her BP meds as she heard that the HCTZ causes cancer.   The following portions of the patient's history were reviewed and updated as appropriate: allergies, current medications, past family history, past medical history, past social history, past surgical history and problem list.  Review of Systems:  Pertinent items are noted in HPI.   Objective:  Physical Exam  BP (!) 144/97   Pulse 97   Wt 289 lb 4.8 oz (131.2 kg)   LMP  (LMP Unknown)   BMI 48.14 kg/m  Weight 289 lb 4.8 oz (131.2 kg).  CONSTITUTIONAL: Well-developed, well-nourished female in no acute distress.  HENT:  Normocephalic, atraumatic EYES: Conjunctivae and EOM are normal. No scleral icterus.  NECK: Normal range of motion SKIN: Skin is warm and dry. No rash noted. Not diaphoretic.No pallor. [place]: Alert and oriented to person, place, and time. Normal coordination.  Abd: Soft, nontender and nondistended Pelvic: deferred  Labs and Imaging 01/30/2017 Diagnosis Uterus, cervix and bilateral fallopian tubes - CERVIX: - SLIGHT CERVICITIS AND NABOTHIAN CYST. - NO DYSPLASIA IDENTIFIED. - ENDOMETRIUM: - SECRETORY WITH PROGESTATIONAL CHANGES. - SMALL BENIGN ENDOMETRIAL POLYP. - NO HYPERPLASIA OR MALIGNANCY. - MYOMETRIUM: - ADENOMYOSIS. - LEIOMYOMATA. - NO EVIDENCE OF MALIGNANCY. - RIGHT AND LEFT FALLOPIAN TUBES: - UNREMARKABLE. - NO ENDOMETRIOSIS OR MALIGNANCY.  Assessment & Plan:  3 week post check Pt is doing well and has no complaints.  I have discussed with tpt he issue with HCTZ. She is reassured and will restart her meds today  HCTZ 25 mg 1 po q day  F/u in 3 weeks  with Dr. Kennon Rounds  May RTW- pt given restrictions for activity.   Kandyce Dieguez L. Harraway-Smith, M.D., [facility] June

## 2017-02-20 NOTE — Progress Notes (Signed)
Here for post op. States not taking hctz because is cancer causing.

## 2017-03-01 ENCOUNTER — Ambulatory Visit (INDEPENDENT_AMBULATORY_CARE_PROVIDER_SITE_OTHER): Payer: Medicaid Other | Admitting: Family Medicine

## 2017-03-01 ENCOUNTER — Encounter: Payer: Self-pay | Admitting: Family Medicine

## 2017-03-01 ENCOUNTER — Other Ambulatory Visit: Payer: Self-pay

## 2017-03-01 VITALS — BP 124/70 | HR 105 | Temp 98.3°F | Wt 286.0 lb

## 2017-03-01 DIAGNOSIS — R002 Palpitations: Secondary | ICD-10-CM

## 2017-03-01 DIAGNOSIS — N3945 Continuous leakage: Secondary | ICD-10-CM

## 2017-03-01 DIAGNOSIS — J069 Acute upper respiratory infection, unspecified: Secondary | ICD-10-CM | POA: Insufficient documentation

## 2017-03-01 DIAGNOSIS — R1032 Left lower quadrant pain: Secondary | ICD-10-CM | POA: Diagnosis not present

## 2017-03-01 LAB — POCT URINALYSIS DIP (MANUAL ENTRY)
Bilirubin, UA: NEGATIVE
Glucose, UA: NEGATIVE mg/dL
Nitrite, UA: NEGATIVE
RBC UA: NEGATIVE
SPEC GRAV UA: 1.025 (ref 1.010–1.025)
UROBILINOGEN UA: 0.2 U/dL
pH, UA: 5.5 (ref 5.0–8.0)

## 2017-03-01 LAB — POCT UA - MICROSCOPIC ONLY

## 2017-03-01 NOTE — Progress Notes (Signed)
Subjective:     Patient ID: Tracy Rose, female   DOB: May 30, 1965, 52 y.o.   MRN: [id number]  Fever   This is a new problem. Episode onset: 2 days ago. The problem occurs intermittently. The problem has been unchanged. The maximum temperature noted was 101 to 101.9 F (101.1 yesterday). Associated symptoms include abdominal pain and coughing. Pertinent negatives include no diarrhea, headaches, nausea, urinary pain or vomiting. Associated symptoms comments:  Started 2 days. She also endorsed urine leakage since she had her hysterectomy. She has tried nothing for the symptoms.  Risk factors: no contaminated food and no contaminated water   Abdominal Pain  This is a new problem. The current episode started 1 to 4 weeks ago (Left lower quad pain started 1 week ago). The onset quality is sudden (she has had 3 episodes in the last week). The problem occurs intermittently. The pain is located in the LLQ. The pain is at a severity of 6/10. The pain is moderate. The quality of the pain is cramping and sharp. Associated symptoms include a fever. Pertinent negatives include no diarrhea, dysuria, headaches, nausea or vomiting. The pain is aggravated by movement. Relieved by: Prayer. She has tried nothing for the symptoms. The treatment provided moderate relief.  Palpitation: She endorsed occasional palpitations,pain that passes through her breast deep down on the left side. She has been drinking a lot of coffee daily. She is currently symptoms free.  Current Outpatient Medications on File Prior to Visit  Medication Sig Dispense Refill  . amLODipine (NORVASC) 5 MG tablet TAKE 1 TABLET(5 MG) BY MOUTH DAILY 90 tablet 1  . cholecalciferol (VITAMIN D) 1000 units tablet Take 2,000 Units by mouth daily.    . Cyanocobalamin (VITAMIN B-12) 5000 MCG TBDP Take 10,000 mcg by mouth daily.    . hydrochlorothiazide (HYDRODIURIL) 25 MG tablet TAKE 1 TABLET(25 MG) BY MOUTH DAILY FOR HIGH BLOOD PRESSURE 90 tablet 1  .  acyclovir (ZOVIRAX) 400 MG tablet TAKE 1 TABLET(400 MG) BY MOUTH TWICE DAILY (Patient not taking: Reported on 03/01/2017) 60 tablet 3  . aspirin 81 MG chewable tablet Chew 81 mg by mouth daily as needed for mild pain.    Marland Kitchen buPROPion (WELLBUTRIN SR) 150 MG 12 hr tablet Take 150 mg by mouth 2 (two) times daily. Take 1 tablet in the morning and 1 tablet at 2pm    . ferrous sulfate 325 (65 FE) MG tablet Take 1 tablet (325 mg total) by mouth 2 (two) times daily with a meal. (Patient not taking: Reported on 03/01/2017) 60 tablet 2  . ibuprofen (ADVIL,MOTRIN) 600 MG tablet Take 1 tablet (600 mg total) by mouth every 8 (eight) hours as needed. 30 tablet 0  . lamoTRIgine (LAMICTAL) 100 MG tablet Take 100 mg by mouth 2 (two) times daily.     Marland Kitchen oxyCODONE-acetaminophen (PERCOCET/ROXICET) 5-325 MG tablet Take 1-2 tablets by mouth every 6 (six) hours as needed. (Patient not taking: Reported on 02/20/2017) 30 tablet 0  . traZODone (DESYREL) 50 MG tablet TAKE 1 TABLET(50 MG) BY MOUTH AT BEDTIME AS NEEDED FOR SLEEP (Patient not taking: Reported on 02/20/2017) 90 tablet 1   No current facility-administered medications on file prior to visit.    Past Medical History:  Diagnosis Date  . Anemia   . Anxiety state   . Bipolar disorder ([place])   . Cervical strain, acute, subsequent encounter 05/29/2016  . Essential hypertension, benign 06/10/2015  . Genital herpes 12/12/2011  . GOITER, MULTINODULAR 09/05/2007  Dr. Zada Girt [facility]in[facility]  [place] 2011: Stable to slightly smaller bilateral thyroid nodules since 09/04/2007. No new or enlarging thyroid nodules identified. Biopsy 08/2007: non neoplastic goiter   . H/O ETOH abuse    Sober since 06/03/1992  . Hyperlipidemia 05/18/2016   Diet controlled, no meds  . Mastalgia in female 06/10/2015  . Mild depression ([place]) 12/15/2013  . OBESITY, NOS 03/21/2006       . SVD (spontaneous vaginal delivery)    x 1  . Tobacco abuse 09/08/2010  . Tobacco abuse    Vitals:   03/01/17  0940 03/01/17 1011  BP: 124/70   Pulse: (!) 111 (!) 105  Temp: 98.3 F (36.8 C)   TempSrc: Oral   SpO2: 94%   Weight: 286 lb (129.7 kg)       Review of Systems  Constitutional: Positive for fever.  Respiratory: Positive for cough.   Gastrointestinal: Positive for abdominal pain. Negative for diarrhea, nausea and vomiting.  Genitourinary: Negative for dysuria.  Neurological: Negative for headaches.  All other systems reviewed and are negative.      Objective:   Physical Exam  Constitutional: She is oriented to person, place, and time. She appears well-developed. No distress.  Cardiovascular: Normal rate and regular rhythm.  No murmur heard. Pulmonary/Chest: Effort normal and breath sounds normal. No respiratory distress. She has no wheezes.  Abdominal: Soft. Normal appearance. She exhibits no mass. There is no hepatosplenomegaly. There is tenderness in the left lower quadrant. There is no rigidity, no rebound and no guarding.    Genitourinary:  Genitourinary Comments: Deferred. She will like to wait for OB/Gyn f/u appointment to get checked.  Musculoskeletal: Normal range of motion. She exhibits no edema.  Neurological: She is alert and oriented to person, place, and time.  Psychiatric: Her speech is normal and behavior is normal. Her mood appears not anxious. She does not exhibit a depressed mood. She expresses no homicidal and no suicidal ideation. She expresses no suicidal plans and no homicidal plans.  Nursing note and vitals reviewed.      Assessment:     Fever with cough Abdominal pain Urine incontinence palpitation    Plan:     Check problem list.  Of note, the only medication she is currently on is her BP med and some home remedy. She stated she is doing well off med mentally and physically since she started her home remedy.  She will f/u with mental health soon for med management. I did advise her to discuss medication adjustment with her Psychiatrist. She  agreed with plan. Otherwise she is not suicidal with good judgment making capacity.

## 2017-03-01 NOTE — Assessment & Plan Note (Signed)
Likely viral presenting with cough and fever. Pulm exam benign. OTC cough regimen recommended as needed. Tylenol as needed for fever. F/U soon if symptoms persists.

## 2017-03-01 NOTE — Patient Instructions (Signed)

## 2017-03-01 NOTE — Assessment & Plan Note (Addendum)
Etiology of her abdominal pain unclear. She endorsed urine leakage since her hysterectomy. Unclear if this is related. Otherwise no other GU symptoms. Tylenol as needed for pain. Consider pelvic/bladder US if symptoms persists. She sees Gyn in 6 days, she is advised to discuss symptoms with them as well. F/U in 2 weeks for reassessment. Return sooner if symptoms persists.  Note: UA recommended. However, this was not done before she left. I called her back and she stated she will return to the lab sometime today. Order placed.

## 2017-03-01 NOTE — Assessment & Plan Note (Signed)
Currently asymptomatic. Recent TSH normal. EKG 1 month ago was normal. I recommended cutting back on coffee. Close monitoring. Return precaution discussed.

## 2017-03-03 LAB — URINE CULTURE

## 2017-03-04 ENCOUNTER — Telehealth: Payer: Self-pay | Admitting: *Deleted

## 2017-03-04 NOTE — Telephone Encounter (Signed)
-----   Message from Kinnie Feil, MD sent at 03/04/2017  9:15 AM EST ----- Please call to inform patient that her urine culture is neg for infection. FOllow-up with Gyn as plan. Call if she has any concern. Thanks.

## 2017-03-04 NOTE — Telephone Encounter (Signed)
Patient informed of urine culture results and also states that she has to reschedule her appointment to follow up with her gyn.  Advised patient to call their office to do this since she was seeing Dr. Kennon Rounds there. Pt voiced understanding. Jazmin Hartsell,CMA

## 2017-03-07 ENCOUNTER — Ambulatory Visit: Payer: Medicaid Other | Admitting: Family Medicine

## 2017-03-07 ENCOUNTER — Encounter: Payer: Self-pay | Admitting: General Practice

## 2017-03-11 ENCOUNTER — Ambulatory Visit (INDEPENDENT_AMBULATORY_CARE_PROVIDER_SITE_OTHER): Payer: Medicaid Other | Admitting: Obstetrics & Gynecology

## 2017-03-11 ENCOUNTER — Encounter: Payer: Self-pay | Admitting: Obstetrics & Gynecology

## 2017-03-11 VITALS — BP 146/115 | HR 97 | Wt 290.9 lb

## 2017-03-11 DIAGNOSIS — Z9889 Other specified postprocedural states: Secondary | ICD-10-CM

## 2017-03-11 NOTE — Progress Notes (Signed)
History:  52 y.o. [id number] here today for 6 weeks post op check from [facility] with bilateral salpingectomy on 01/29/2017.  Pt has gone back to work. She is not having any problems. She reports an increased libido that is higher than its ever been.  She denies bleeding pr pain .     The following portions of the patient's history were reviewed and updated as appropriate: allergies, current medications, past family history, past medical history, past social history, past surgical history and problem list.  Review of Systems:  Pertinent items are noted in HPI.   Objective:  Physical Exam BP (!) 146/115   Pulse 97   Wt 290 lb 14.4 oz (132 kg)   LMP  (LMP Unknown)   BMI 48.41 kg/m   CONSTITUTIONAL: Well-developed, well-nourished female in no acute distress.  HENT:  Normocephalic, atraumatic EYES: Conjunctivae and EOM are normal. No scleral icterus.  NECK: Normal range of motion SKIN: Skin is warm and dry. No rash noted. Not diaphoretic.No pallor. [place]: Alert and oriented to person, place, and time. Normal coordination.  Abd: Soft, nontender and nondistended; obese.   Pelvic: Normal appearing external genitalia; normal appearing vaginal mucosa and cervix.  Normal discharge. Cuff well healed. Some suture note.     Labs and Imaging 01/29/2017 Diagnosis Uterus, cervix and bilateral fallopian tubes - CERVIX: - SLIGHT CERVICITIS AND NABOTHIAN CYST. - NO DYSPLASIA IDENTIFIED. - ENDOMETRIUM: - SECRETORY WITH PROGESTATIONAL CHANGES. - SMALL BENIGN ENDOMETRIAL POLYP. - NO HYPERPLASIA OR MALIGNANCY. - MYOMETRIUM: - ADENOMYOSIS. - LEIOMYOMATA. - NO EVIDENCE OF MALIGNANCY. - RIGHT AND LEFT FALLOPIAN TUBES: - UNREMARKABLE. - NO ENDOMETRIOSIS OR MALIGNANCY  Assessment & Plan:  6 week post op check- doing well  F/u in 3 months or sooner prn Gradual increase in activity May return to intercourse in 2 weeks  Jennifer Holland L. Harraway-Smith, M.D., [place]nn June

## 2017-03-11 NOTE — Patient Instructions (Signed)
Vaginal Hysterectomy, Care After °Refer to this sheet in the next few weeks. These instructions provide you with information about caring for yourself after your procedure. Your health care provider may also give you more specific instructions. Your treatment has been planned according to current medical practices, but problems sometimes occur. Call your health care provider if you have any problems or questions after your procedure. °What can I expect after the procedure? °After the procedure, it is common to have: °· Pain. °· Soreness and numbness in your incision areas. °· Vaginal bleeding and discharge. °· Constipation. °· Temporary problems emptying the bladder. °· Feelings of sadness or other emotions. ° °Follow these instructions at home: °Medicines °· Take over-the-counter and prescription medicines only as told by your health care provider. °· If you were prescribed an antibiotic medicine, take it as told by your health care provider. Do not stop taking the antibiotic even if you start to feel better. °· Do not drive or operate heavy machinery while taking prescription pain medicine. °Activity °· Return to your normal activities as told by your health care provider. Ask your health care provider what activities are safe for you. °· Get regular exercise as told by your health care provider. You may be told to take short walks every day and go farther each time. °· Do not lift anything that is heavier than 10 lb (4.5 kg). °General instructions ° °· Do not put anything in your vagina for 6 weeks after your surgery or as told by your health care provider. This includes tampons and douches. °· Do not have sex until your health care provider says you can. °· Do not take baths, swim, or use a hot tub until your health care provider approves. °· Drink enough fluid to keep your urine clear or pale yellow. °· Do not drive for 24 hours if you were given a sedative. °· Keep all follow-up visits as told by your health  care provider. This is important. °Contact a health care provider if: °· Your pain medicine is not helping. °· You have a fever. °· You have redness, swelling, or pain at your incision site. °· You have blood, pus, or a bad-smelling discharge from your vagina. °· You continue to have difficulty urinating. °Get help right away if: °· You have severe abdominal or back pain. °· You have heavy bleeding from your vagina. °· You have chest pain or shortness of breath. °This information is not intended to replace advice given to you by your health care provider. Make sure you discuss any questions you have with your health care provider. °Document Released: 05/02/2015 Document Revised: 06/16/2015 Document Reviewed: 01/23/2015 °Elsevier Interactive Patient Education © 2018 [employer] Inc. ° °

## 2017-07-02 ENCOUNTER — Ambulatory Visit: Payer: Medicaid Other | Admitting: Family Medicine
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
## 2017-08-20 ENCOUNTER — Ambulatory Visit (INDEPENDENT_AMBULATORY_CARE_PROVIDER_SITE_OTHER): Payer: Medicaid Other | Admitting: Family Medicine

## 2017-08-20 DIAGNOSIS — K219 Gastro-esophageal reflux disease without esophagitis: Secondary | ICD-10-CM

## 2017-08-20 MED ORDER — ONDANSETRON HCL 4 MG PO TABS: 4.0000 mg | ORAL_TABLET | Freq: Three times a day (TID) | ORAL | 0 refills | Status: DC | PRN

## 2017-08-20 MED ORDER — PANTOPRAZOLE SODIUM 40 MG PO TBEC: 40.0000 mg | DELAYED_RELEASE_TABLET | Freq: Every day | ORAL | 3 refills | Status: DC

## 2017-08-20 NOTE — Patient Instructions (Signed)
It was nice meeting you today.  You were seen in clinic for nausea and bad taste in your mouth.   As we discussed, your symptoms are most likely related to gastroesophageal reflux disease.  I would advise avoiding hot and spicy foods, coffee and tea, alcohol, chocolate and other trigger foods as these can exacerbate your symptoms.  I would also avoid laying down right after eating.  You can try to eat your meals 1 to 2 hours before bedtime and see if this helps.  I have called in a prescription to your pharmacy called pantoprazole.  If you have new or worsening symptoms, please make an appointment to be seen by a provider.  Be well, Lovenia Kim MD   Gastroesophageal Reflux Disease, Adult Normally, food travels down the esophagus and stays in the stomach to be digested. If a person has gastroesophageal reflux disease (GERD), food and stomach acid move back up into the esophagus. When this happens, the esophagus becomes sore and swollen (inflamed). Over time, GERD can make small holes (ulcers) in the lining of the esophagus. Follow these instructions at home: Diet  Follow a diet as told by your doctor. You may need to avoid foods and drinks such as: ? Coffee and tea (with or without caffeine). ? Drinks that contain alcohol. ? Energy drinks and sports drinks. ? Carbonated drinks or sodas. ? Chocolate and cocoa. ? Peppermint and mint flavorings. ? Garlic and onions. ? Horseradish. ? Spicy and acidic foods, such as peppers, chili powder, curry powder, vinegar, hot sauces, and BBQ sauce. ? Citrus fruit juices and citrus fruits, such as oranges, lemons, and limes. ? Tomato-based foods, such as red sauce, chili, salsa, and pizza with red sauce. ? Fried and fatty foods, such as donuts, french fries, potato chips, and high-fat dressings. ? High-fat meats, such as hot dogs, rib eye steak, sausage, ham, and bacon. ? High-fat dairy items, such as whole milk, butter, and cream cheese.  Eat small meals  often. Avoid eating large meals.  Avoid drinking large amounts of liquid with your meals.  Avoid eating meals during the 2-3 hours before bedtime.  Avoid lying down right after you eat.  Do not exercise right after you eat. General instructions  Pay attention to any changes in your symptoms.  Take over-the-counter and prescription medicines only as told by your doctor. Do not take aspirin, ibuprofen, or other NSAIDs unless your doctor says it is okay.  Do not use any tobacco products, including cigarettes, chewing tobacco, and e-cigarettes. If you need help quitting, ask your doctor.  Wear loose clothes. Do not wear anything tight around your waist.  Raise (elevate) the head of your bed about 6 inches (15 cm).  Try to lower your stress. If you need help doing this, ask your doctor.  If you are overweight, lose an amount of weight that is healthy for you. Ask your doctor about a safe weight loss goal.  Keep all follow-up visits as told by your doctor. This is important. Contact a doctor if:  You have new symptoms.  You lose weight and you do not know why it is happening.  You have trouble swallowing, or it hurts to swallow.  You have wheezing or a cough that keeps happening.  Your symptoms do not get better with treatment.  You have a hoarse voice. Get help right away if:  You have pain in your arms, neck, jaw, teeth, or back.  You feel sweaty, dizzy, or light-headed.  You have chest pain or shortness of breath.  You throw up (vomit) and your throw up looks like blood or coffee grounds.  You pass out (faint).  Your poop (stool) is bloody or black.  You cannot swallow, drink, or eat. This information is not intended to replace advice given to you by your health care provider. Make sure you discuss any questions you have with your health care provider. Document Released: 06/27/2007 Document Revised: 06/16/2015 Document Reviewed: 05/05/2014 Elsevier Interactive  Patient Education  [employer].

## 2017-08-20 NOTE — Progress Notes (Signed)
   Subjective:   Patient ID: [name]    DOB: [birth year], 52 y.o. female   MRN: [id number]  CC: nausea, reflux    HPI: Tracy Rose is a 52 y.o. female who presents to clinic today for the following issues.   Bad taste in mouth Patient describes yesterday she was nauseous and had a bad taste in her mouth.  She is unable to describe the taste, maybe metallic.  She has not taken anything at home for the nausea.  She has been trying to wean herself off coffee and also has been trying to stop smoking.  She endorses laying down right away after eating most days.  No episodes of vomiting.  No chest pain, shortness of breath, abdominal pain.  No diarrhea or constipation noted.  ROS: See HPI for pertinent ROS.  Social: Current smoker, attempting to quit Medications reviewed. Objective:   BP 138/72   Pulse 89   Temp (!) 97.5 F (36.4 C) (Oral)   Ht 5\' 6"  (1.676 m)   Wt 274 lb 9.6 oz (124.6 kg)   LMP  (LMP Unknown)   SpO2 97%   BMI 44.32 kg/m  Vitals and nursing note reviewed.  General: Pleasant 52 year old female, NAD HEENT: NCAT, EOMI, PERRL, MMM, oropharynx clear Neck: supple, non-tender, normal ROM, no LAD  CV: RRR no MRG  Lungs: CTAB, normal effort  Abdomen: soft, NTND, +bs  Skin: warm, dry, no rash Extremities: warm and well perfused, normal tone Neuro: alert, oriented x3, no focal deficits   Assessment & Plan:   GERD (gastroesophageal reflux disease) Patient describes symptoms consistent with reflux, we will trial a PPI. -Rx: Pantoprazole, Zofran  -Advised to eat 1-2 hours prior to laying down -Avoid hot and spicy foods, coffee, chocolate, alcohol -F/u if symptoms worsen or do not improve  Meds ordered this encounter  Medications  . pantoprazole (PROTONIX) 40 MG tablet    Sig: Take 1 tablet (40 mg total) by mouth daily.    Dispense:  30 tablet    Refill:  3  . ondansetron (ZOFRAN) 4 MG tablet    Sig: Take 1 tablet (4 mg total) by mouth every 8 (eight)  hours as needed for nausea or vomiting.    Dispense:  20 tablet    Refill:  0   Lovenia Kim, MD [place], PGY-3 08/27/2017 9:48 AM

## 2017-08-27 ENCOUNTER — Encounter: Payer: Self-pay | Admitting: Family Medicine

## 2017-08-27 DIAGNOSIS — K219 Gastro-esophageal reflux disease without esophagitis: Secondary | ICD-10-CM

## 2017-08-27 HISTORY — DX: Gastro-esophageal reflux disease without esophagitis: K21.9

## 2017-08-27 NOTE — Assessment & Plan Note (Addendum)
Patient describes symptoms consistent with reflux, we will trial a PPI. -Rx: Pantoprazole, Zofran  -Advised to eat 1-2 hours prior to laying down -Avoid hot and spicy foods, coffee, chocolate, alcohol -F/u if symptoms worsen or do not improve

## 2017-09-12 ENCOUNTER — Other Ambulatory Visit: Payer: Self-pay

## 2017-09-12 ENCOUNTER — Ambulatory Visit (INDEPENDENT_AMBULATORY_CARE_PROVIDER_SITE_OTHER): Payer: Medicaid Other | Admitting: Family Medicine

## 2017-09-12 ENCOUNTER — Encounter: Payer: Self-pay | Admitting: Family Medicine

## 2017-09-12 VITALS — BP 118/82 | HR 95 | Temp 98.6°F | Ht 66.0 in | Wt 266.0 lb

## 2017-09-12 DIAGNOSIS — I10 Essential (primary) hypertension: Secondary | ICD-10-CM

## 2017-09-12 MED ORDER — AMLODIPINE BESYLATE 10 MG PO TABS: ORAL_TABLET | ORAL | 11 refills | Status: DC

## 2017-09-12 MED ORDER — HYDROCHLOROTHIAZIDE 12.5 MG PO TABS: ORAL_TABLET | ORAL | 11 refills | Status: DC

## 2017-09-12 NOTE — Progress Notes (Addendum)
   Subjective:    Tracy Rose - 52 y.o. female MRN [id number]  Date of birth: [birth year]  HPI  Tracy Rose is here for refill of her blood pressure medication.  She says she has not been taking her amlodipine for a long time since she couldn't find the medication bottle.  When she went to her psychiatrist office earlier this week, her psychiatrist's NP took her out of work this week for a blood pressure in the 160s/110s.  After she saw her blood pressure, Ms. Oo took four pills of HCTZ 25 mg yesterday and this morning.  She says she has been urinating very frequently after taking this amount of medication.  She reports feeling overall well otherwise.   Health Maintenance:  - offered flu shot today; declined There are no preventive care reminders to display for this patient.  -  reports that she has been smoking cigarettes. She has a 6.25 pack-year smoking history. She has never used smokeless tobacco. - Review of Systems: Per HPI. - Past Medical History: Patient Active Problem List   Diagnosis Date Noted  . GERD (gastroesophageal reflux disease) 08/27/2017  . Abdominal pain, left lower quadrant 03/01/2017  . S/P vaginal hysterectomy 01/29/2017  . Cervical strain, acute, subsequent encounter 05/29/2016  . Hyperlipidemia 05/18/2016  . Breast mass, left 10/28/2015  . MDD (major depressive disorder), recurrent, severe, with psychosis ([place]) 08/06/2015  . Insomnia   . Anxiety state   . Severe manic bipolar 1 disorder with psychotic behavior ([place]) 08/05/2015  . Essential hypertension, benign 06/10/2015  . ADHD (attention deficit hyperactivity disorder) 08/15/2014  . Menorrhagia 08/19/2013  . Palpitations 04/07/2013  . Macromastia 04/07/2013  . Genital herpes 12/12/2011  . Tobacco abuse 09/08/2010  . GOITER, MULTINODULAR 09/05/2007  . Morbid obesity ([place]) 03/21/2006   - Medications: reviewed and updated   Objective:   Physical Exam BP 118/82   Pulse 95   Temp 98.6 F  (37 C) (Oral)   Ht 5\' 6"  (1.676 m)   Wt 266 lb (120.7 kg)   LMP  (LMP Unknown)   SpO2 95%   BMI 42.93 kg/m  Gen: NAD, alert, cooperative with exam, well-appearing CV: RRR, good S1/S2, no murmur Resp: CTABL, no wheezes, non-labored Abd: SNTND, BS present, no guarding or organomegaly  Assessment & Plan:   Essential hypertension, benign Patient's blood pressure was normal today at 118/82.  Reviewed with patient the mechanism of HCTZ, and advised her to only take 1 pill/day from now on since it can cause electrolyte changes that can be dangerous.  I told her that it can cause kidney injury if an excessive dose is taken.  Will obtain BMP today to check electrolytes and kidney function.  We will also adjust her amlodipine and HCTZ doses, going to amlodipine 10 mg and HCTZ 12.5 mg given the better efficacy and side effect profile of amlodipine, and the evidence that 12.5 mg can be as good or better than 25 mg of HCTZ.  Would like patient to return in 1 month for a blood pressure check and to check that her new medication regimen is working for her.    Maia Breslow, M.D. 09/12/2017, 2:19 PM PGY-2, [place]

## 2017-09-12 NOTE — Assessment & Plan Note (Addendum)
Patient's blood pressure was normal today at 118/82.  Reviewed with patient the mechanism of HCTZ, and advised her to only take 1 pill/day from now on since it can cause electrolyte changes that can be dangerous.  I told her that it can cause kidney injury if an excessive dose is taken.  Will obtain BMP today to check electrolytes and kidney function.  We will also adjust her amlodipine and HCTZ doses, going to amlodipine 10 mg and HCTZ 12.5 mg given the better efficacy and side effect profile of amlodipine, and the evidence that 12.5 mg can be as good or better than 25 mg of HCTZ.  Would like patient to return in 1 month for a blood pressure check and to check that her new medication regimen is working for her.

## 2017-09-12 NOTE — Patient Instructions (Addendum)
It was nice meeting you today Tracy Rose!  Today, we are changing your blood pressure medications to amlodipine 10 mg daily and HCTZ 12.5 mg daily.  You will still take one pill of each medicine per day.    If you run out of your amlodipine, we can refill it if you call our clinic.  Please only take one pill of HCTZ per day.  We are getting a blood test today to check your kidneys and your electrolytes.  I will call you if these results are abnormal.  If you have any questions or concerns, please feel free to call the clinic.   Be well,  Dr. Shan Levans

## 2017-09-13 LAB — BASIC METABOLIC PANEL
BUN / CREAT RATIO: 16 (ref 9–23)
BUN: 19 mg/dL (ref 6–24)
CALCIUM: 9.5 mg/dL (ref 8.7–10.2)
CHLORIDE: 98 mmol/L (ref 96–106)
CO2: 23 mmol/L (ref 20–29)
Creatinine, Ser: 1.19 mg/dL — ABNORMAL HIGH (ref 0.57–1.00)
GFR calc Af Amer: 62 mL/min/{1.73_m2} (ref >59)
GFR calc non Af Amer: 54 mL/min/{1.73_m2} — ABNORMAL LOW (ref >59)
GLUCOSE: 115 mg/dL — AB (ref 65–99)
Potassium: 3.6 mmol/L (ref 3.5–5.2)
Sodium: 139 mmol/L (ref 134–144)

## 2017-10-04 ENCOUNTER — Ambulatory Visit: Payer: Medicaid Other | Admitting: Family Medicine

## 2017-10-07 ENCOUNTER — Other Ambulatory Visit: Payer: Self-pay

## 2017-10-07 ENCOUNTER — Ambulatory Visit (INDEPENDENT_AMBULATORY_CARE_PROVIDER_SITE_OTHER): Payer: Medicaid Other | Admitting: Family Medicine

## 2017-10-07 VITALS — BP 115/80 | HR 86 | Temp 98.0°F | Wt 263.0 lb

## 2017-10-07 DIAGNOSIS — S060X0D Concussion without loss of consciousness, subsequent encounter: Secondary | ICD-10-CM | POA: Diagnosis present

## 2017-10-07 DIAGNOSIS — W108XXD Fall (on) (from) other stairs and steps, subsequent encounter: Secondary | ICD-10-CM

## 2017-10-07 NOTE — Patient Instructions (Signed)
It was a pleasure to see you today! Thank you for choosing [facility] for your primary care. Tracy Rose was seen for dog incident.   Our plans for today were:  You may have a concussion due to the fall. Please rest your brain for the week. Instructions below. I wrote a note to return to work on Monday.   For your pain, use tylenol every 6 hours. Use naproxen if you still have pain. Use heat via a heating pad.   Come back next week if you are still having symptoms.   The behavioral health consultants will call you tomorrow.   Best,  Dr. Lindell Noe   Concussion, Adult A concussion is a brain injury from a direct hit (blow) to the head or body. This injury causes the brain to shake quickly back and forth inside the skull. It is caused by:  A hit to the head.  A quick and sudden movement (jolt) of the head or neck.  How fast you will get better from a concussion depends on many things like how bad your concussion was, what part of your brain was hurt, how old you are, and how healthy you were before the concussion. Recovery can take time. It is important to wait to return to activity until a doctor says it is safe and your symptoms are all gone. Follow these instructions at home: Activity  Limit activities that need a lot of thought or concentration. These include: ? Homework or work for your job. ? Watching TV. ? Computer work. ? Playing memory games and puzzles.  Rest. Rest helps the brain to heal. Make sure you: ? Get plenty of sleep at night. Do not stay up late. ? Go to bed at the same time every day. ? Rest during the day. Take naps or rest breaks when you feel tired.  It can be dangerous if you get another concussion before the first one has healed Do not do activities that could cause a second concussion, such as riding a bike or playing sports.  Ask your doctor when you can return to your normal activities, like driving, riding a bike, or using machinery.  Your ability to react may be slower. Do not do these activities if you are dizzy. Your doctor will likely give you a plan for slowly going back to activities. General instructions  Take over-the-counter and prescription medicines only as told by your doctor.  Do not drink alcohol until your doctor says you can.  If it is harder than usual to remember things, write them down.  If you are easily distracted, try to do one thing at a time. For example, do not try to watch TV while making dinner.  Talk with family members or close friends when you need to make important decisions.  Watch your symptoms and tell other people to do the same. Other problems (complications) can happen after a concussion. Older adults with a brain injury may have a higher risk of serious problems, such as a blood clot in the brain.  Tell your teachers, school nurse, school counselor, coach, Product/process development scientist, or work Freight forwarder about your injury and symptoms. Tell them about what you can or cannot do. They should watch for: ? More problems with attention or concentration. ? More trouble remembering or learning new information. ? More time needed to do tasks or assignments. ? Being more annoyed (irritable) or having a harder time dealing with stress. ? Any other symptoms that get  worse.  Keep all follow-up visits as told by your health care provider. This is important. Prevention  It is very important that you donot get another brain injury, especially before you have healed. In rare cases, another injury can cause permanent brain damage, brain swelling, or death. You have the most risk if you get another head injury in the first 7-10 days after you were hurt before. To avoid injuries: ? Wear a seat belt when you ride in a car. ? Do not drink too much alcohol. ? Avoid activities that could make you get a second concussion, like contact sports. ? Wear a helmet when you do activities  like:  Biking.  Skiing.  Skateboarding.  Skating. ? Make your home safe by:  Removing things from the floor or stairs that could make you trip.  Using grab bars in bathrooms and handrails by stairs.  Placing non-slip mats on floors and in bathtubs.  Putting more light in dark areas. Contact a doctor if:  Your symptoms get worse.  You have new symptoms.  You keep having symptoms for more than 2 weeks. Get help right away if:  You have bad headaches, or your headaches get worse.  You have weakness in any part of your body.  You have loss of feeling (numbness).  You feel off balance.  You keep throwing up (vomiting).  You feel more sleepy.  The black center of one eye (pupil) is bigger than the other one.  You twitch or shake violently (convulse) or have a seizure.  Your speech is not clear (is slurred).  You feel more tired, more confused, or more annoyed.  You do not recognize people or places.  You have neck pain.  It is hard to wake you up.  You have strange behavior changes.  You pass out (lose consciousness). Summary  A concussion is a brain injury from a direct hit (blow) to the head or body.  This condition is treated with rest and careful watching of symptoms.  If you keep having symptoms for more than 2 weeks, call your doctor. This information is not intended to replace advice given to you by your health care provider. Make sure you discuss any questions you have with your health care provider. Document Released: 12/27/2008 Document Revised: 12/24/2015 Document Reviewed: 12/24/2015 Elsevier Interactive Patient Education  2017 [employer].
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
## 2017-10-10 ENCOUNTER — Encounter: Payer: Self-pay | Admitting: Family Medicine

## 2017-10-11 ENCOUNTER — Telehealth: Payer: Self-pay | Admitting: Family Medicine

## 2017-10-11 ENCOUNTER — Encounter (HOSPITAL_COMMUNITY): Payer: Self-pay | Admitting: Emergency Medicine

## 2017-10-11 ENCOUNTER — Emergency Department (HOSPITAL_COMMUNITY)
Admission: EM | Admit: 2017-10-11 | Discharge: 2017-10-12 | Disposition: A | Discharge status: Home / Self Care | Payer: Medicaid Other | Attending: Emergency Medicine | Admitting: Emergency Medicine

## 2017-10-11 ENCOUNTER — Ambulatory Visit: Payer: Medicaid Other | Admitting: Licensed Clinical Social Worker

## 2017-10-11 ENCOUNTER — Other Ambulatory Visit: Payer: Self-pay

## 2017-10-11 ENCOUNTER — Encounter: Payer: Self-pay | Admitting: Family Medicine

## 2017-10-11 ENCOUNTER — Ambulatory Visit (INDEPENDENT_AMBULATORY_CARE_PROVIDER_SITE_OTHER): Payer: Medicaid Other | Admitting: Family Medicine

## 2017-10-11 VITALS — BP 140/88 | HR 84 | Temp 98.1°F | Ht 66.0 in | Wt 264.6 lb

## 2017-10-11 DIAGNOSIS — G4489 Other headache syndrome: Secondary | ICD-10-CM | POA: Diagnosis not present

## 2017-10-11 DIAGNOSIS — Z79899 Other long term (current) drug therapy: Secondary | ICD-10-CM | POA: Insufficient documentation

## 2017-10-11 DIAGNOSIS — W540XXD Bitten by dog, subsequent encounter: Secondary | ICD-10-CM

## 2017-10-11 DIAGNOSIS — F17210 Nicotine dependence, cigarettes, uncomplicated: Secondary | ICD-10-CM | POA: Insufficient documentation

## 2017-10-11 DIAGNOSIS — Y929 Unspecified place or not applicable: Secondary | ICD-10-CM | POA: Insufficient documentation

## 2017-10-11 DIAGNOSIS — F333 Major depressive disorder, recurrent, severe with psychotic symptoms: Secondary | ICD-10-CM

## 2017-10-11 DIAGNOSIS — M542 Cervicalgia: Secondary | ICD-10-CM

## 2017-10-11 DIAGNOSIS — S90811A Abrasion, right foot, initial encounter: Secondary | ICD-10-CM | POA: Insufficient documentation

## 2017-10-11 DIAGNOSIS — I10 Essential (primary) hypertension: Secondary | ICD-10-CM | POA: Insufficient documentation

## 2017-10-11 DIAGNOSIS — S50312A Abrasion of left elbow, initial encounter: Secondary | ICD-10-CM | POA: Diagnosis present

## 2017-10-11 DIAGNOSIS — Y999 Unspecified external cause status: Secondary | ICD-10-CM | POA: Diagnosis not present

## 2017-10-11 DIAGNOSIS — Z23 Encounter for immunization: Secondary | ICD-10-CM | POA: Diagnosis not present

## 2017-10-11 DIAGNOSIS — Y939 Activity, unspecified: Secondary | ICD-10-CM | POA: Diagnosis not present

## 2017-10-11 DIAGNOSIS — Z203 Contact with and (suspected) exposure to rabies: Secondary | ICD-10-CM | POA: Diagnosis not present

## 2017-10-11 DIAGNOSIS — F312 Bipolar disorder, current episode manic severe with psychotic features: Secondary | ICD-10-CM

## 2017-10-11 DIAGNOSIS — W540XXA Bitten by dog, initial encounter: Secondary | ICD-10-CM | POA: Insufficient documentation

## 2017-10-11 DIAGNOSIS — F4323 Adjustment disorder with mixed anxiety and depressed mood: Secondary | ICD-10-CM

## 2017-10-11 DIAGNOSIS — Z2914 Encounter for prophylactic rabies immune globin: Secondary | ICD-10-CM | POA: Insufficient documentation

## 2017-10-11 MED ORDER — BACLOFEN 5 MG PO TABS: 5.0000 mg | ORAL_TABLET | Freq: Two times a day (BID) | ORAL | 0 refills | Status: DC

## 2017-10-11 MED ORDER — IBUPROFEN 400 MG PO TABS
400.0000 mg | ORAL_TABLET | Freq: Three times a day (TID) | ORAL | 0 refills | Status: AC | PRN
Start: 2017-10-11 — End: 2017-10-18

## 2017-10-11 NOTE — Assessment & Plan Note (Signed)
Poor compliant with management. [facility] consulted today to evaluate. They ensure that she schedule f/u with her psych and appointment was made for 1 PM today. No suicidal ideation.

## 2017-10-11 NOTE — ED Triage Notes (Signed)
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  Pt states the owner of the dogs was unable to provide shot records so her MD advised her to come to ED for rabies shots.

## 2017-10-11 NOTE — Patient Instructions (Signed)
Animal Bite Animal bite wounds can get infected. It is important to get proper medical treatment. Ask your doctor if you need rabies treatment. Follow these instructions at home: Wound care  Follow instructions from your doctor about how to take care of your wound. Make sure you: ? Wash your hands with soap and water before you change your bandage (dressing). If you cannot use soap and water, use hand sanitizer. ? Change your bandage as told by your doctor. ? Leave stitches (sutures), skin glue, or skin tape (adhesive) strips in place. They may need to stay in place for 2 weeks or longer. If tape strips get loose and curl up, you may trim the loose edges. Do not remove tape strips completely unless your doctor says it is okay.  Check your wound every day for signs of infection. Watch for: ? Redness, swelling, or pain that gets worse. ? Fluid, blood, or pus. General instructions  Take or apply over-the-counter and prescription medicines only as told by your doctor.  If you were prescribed an antibiotic, take or apply it as told by your doctor. Do not stop using the antibiotic even if your condition improves.  Keep the injured area raised (elevated) above the level of your heart while you are sitting or lying down.  If directed, apply ice to the injured area. ? Put ice in a plastic bag. ? Place a towel between your skin and the bag. ? Leave the ice on for 20 minutes, 2-3 times per day.  Keep all follow-up visits as told by your doctor. This is important. Contact a doctor if:  You have redness, swelling, or pain that gets worse.  You have a general feeling of sickness (malaise).  You feel sick to your stomach (nauseous).  You throw up (vomit).  You have pain that does not get better. Get help right away if:  You have a red streak going away from your wound.  You have fluid, blood, or pus coming from your wound.  You have a fever or chills.  You have trouble moving your  injured area.  You have numbness or tingling anywhere on your body. This information is not intended to replace advice given to you by your health care provider. Make sure you discuss any questions you have with your health care provider. Document Released: 01/08/2005 Document Revised: 06/16/2015 Document Reviewed: 05/26/2014 Elsevier Interactive Patient Education  2018 [employer]  

## 2017-10-11 NOTE — Assessment & Plan Note (Signed)
She had completed all antibiotics and was given Tdap per patient at the ED. She stated that she also got a shot on her butt, unclear if it is rabies vaccination. She stated that she will obtain her d/c summary once she gets home and call. I tried to pull up record from care everywhere with no success. She will benefit from initiating rabies vaccination unless she is able to confirm that the dogs were vaccinated or otherwise recommended against from her recent ED visit. She agreed with the plan.

## 2017-10-11 NOTE — Progress Notes (Signed)
Type of Service: Integrated Behavioral Health Estimate Time:35 minutes Interpreter:No.   Tracy Rose is a 52 y.o. female referred by Dr. Gwendlyn Deutscher for Assessment after traumatic dog bite incident. Patient is currently temp out of work due to recent traumatic event of dog bite. Patient is pleasant and engaged in conversation. Reports the following concerns:experiencing nightmares and sleep troubles as well as irritability and anxiety upon encountering dogs.  Duration of problem: 1 week.  Mental Health/ Substance Hx: ADHD, Anxiety and depression. Is currently being treated at [facility] by Olivia Mackie for the past two years.   Life & Social Patient has a 52 year old daughter and works at [employer].  Recent life changes: None Reported. Issues discussed: community resources, and Integrated care services, previous and current mental health treatment. GOALS ADDRESSED:  Patient will: 1. Increase knowledge and/or ability of: self-management skills  2. Demonstrate ability to: Increase healthy adjustment to current life circumstances 3.   Reduce symptoms of: anxiety and depression 4.  Intervention:  solutions focus strategies, community resources, emotional support, setting up appointment with Psychiatry.  Assessment/Plan:  Patient is currently experiencing symptoms of  Anxiety, and depression which are exacerbated by recent traumatic dog attack.  Patient may benefit from and is in agreement to  1. receive further assessment therapeutic interventions to assist with managing her anxiety and medication management at [facility] with established psychiatrist Olivia Mackie 2. Attend her appointment today at [facility] at 1:00 to discuss current stressors.   Audry Riles MSW Social Work Intern [employer] [phone]

## 2017-10-11 NOTE — Progress Notes (Signed)
Tracy Panther, LCSW, have reviewed all documentation for this [facility] visit. The documentation on 10/11/17 and interventions are all accurate and complete.    GAD 7 : Generalized Anxiety Score 10/11/2017 12/11/2016  Nervous, Anxious, on Edge 3 1  Control/stop worrying 1 1  Worry too much - different things 1 1  Trouble relaxing 2 0  Restless 1 0  Easily annoyed or irritable 3 1  Afraid - awful might happen 2 0  Total GAD 7 Score 13 4  Anxiety Difficulty Somewhat difficult -   Depression screen [facility] 2/9 10/11/2017 10/07/2017 09/12/2017  Decreased Interest 2 0 0  Down, Depressed, Hopeless 1 0 0  PHQ - 2 Score 3 0 0  Altered sleeping 2 - -  Tired, decreased energy 2 - -  Change in appetite 1 - -  Feeling bad or failure about yourself  2 - -  Trouble concentrating 2 - -  Moving slowly or fidgety/restless 2 - -  Suicidal thoughts 0 - -  PHQ-9 Score 14 - -  Difficult doing work/chores Very difficult - -  Some recent data might be hidden   Based on assessment patient may benefit from and was in agreement to start therapy at [facility] as she is already established there for psychiatric services. Appointment made for her today at 1:00 while in office. This was a joint visit with LCSW and [facility] intern  Casimer Lanius, LCSW Licensed Clinical Social Worker [place]   [phone] 2:54 PM

## 2017-10-11 NOTE — Assessment & Plan Note (Signed)
Following dog attack. Baclofen prescribed prn pain and muscle spasm.

## 2017-10-11 NOTE — Telephone Encounter (Addendum)
I was able to reach her. She stated that her d/c summary did not say anything about rabies shot. I am uncertain how far along from the day of bite it will be unneccessary to initiate rabies shot.  I gave her two options? 1. Contact dog owner to see if the animals were vaccinated. 2. Go to the urgent care to initiate rabies vaccination since we don't do that here.  She prefers to go to the urgent care today.  I also contacted the [facility] at [phone] to see if she need to start vaccination since it has been more than 10 days since she got bitten by the dog. I left a message and yet to hear back from the on-call provider.   Addendum: Dr. Dierdre Highman from [place] called back and recommended that she starts Rabies vaccination. She is already aware she needed to go to the Urgent care to initiate this.  Dr. Bobby Rumpf also stated that it is okay to restart rabies series even if it was initiated at [facility] since we do not know for sure.

## 2017-10-11 NOTE — Assessment & Plan Note (Signed)
Been since she had the dog attack. No neurologic deficit. Red flag signs discussed. May go to the ED if she develops red flags. Ibuprofen prn for 7 days. We will recheck kidney function at her next visit.

## 2017-10-11 NOTE — Assessment & Plan Note (Signed)
Poor compliant with management. [facility] consulted today to evaluate. They ensure that she schedule f/u with her psych and appointment was made for 1 PM today. No suicidal ideation.

## 2017-10-11 NOTE — Progress Notes (Signed)
Subjective:     Patient ID: Tracy Rose, female   DOB: 1965-03-07, 52 y.o.   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 She stated that they gave her Tetanus shot, pain meds and antibiotic. She is uncertain if she got rabies vaccination and was not told she needed one. She does not know if the animal was vaccinated as well. Since then she has been having neck pain and frontal headache. She is unable to concentrate and she still get scared while thinking about the event. She uses Tylenol as needed for pain without improvement. She showed me pictures of her injury on the phone which is not all that clear. Currently does not have bruising on her. Also endorse ringing in her left ear. Obesity: Here for f/u. MDD: Poor compliant with meds and f/u.       Current Outpatient Medications on File Prior to Visit  Medication Sig Dispense Refill  . acyclovir (ZOVIRAX) 400 MG tablet TAKE 1 TABLET(400 MG) BY MOUTH TWICE DAILY (Patient not taking: Reported on 03/01/2017) 60 tablet 3  . amLODipine (NORVASC) 10 MG tablet TAKE 1 TABLET(5 MG) BY MOUTH DAILY 30 tablet 11  . aspirin 81 MG chewable tablet Chew 81 mg by mouth daily as needed for mild pain.    Marland Kitchen buPROPion (WELLBUTRIN SR) 150 MG 12 hr tablet Take 150 mg by mouth 2 (two) times daily. Take 1 tablet in the morning and 1 tablet at 2pm    . cholecalciferol (VITAMIN D) 1000 units tablet Take 2,000 Units by mouth daily.    . Cyanocobalamin (VITAMIN B-12) 5000 MCG TBDP Take 10,000 mcg by mouth daily.    . ferrous sulfate 325 (65 FE) MG tablet Take 1 tablet (325 mg total) by mouth 2 (two) times daily with a meal. (Patient not taking: Reported on 03/01/2017) 60 tablet 2  . hydrochlorothiazide (HYDRODIURIL) 12.5 MG tablet TAKE 1 TABLET(12.5 MG) BY MOUTH DAILY FOR HIGH BLOOD PRESSURE 30 tablet 11  . ibuprofen  (ADVIL,MOTRIN) 600 MG tablet Take 1 tablet (600 mg total) by mouth every 8 (eight) hours as needed. (Patient not taking: Reported on 03/11/2017) 30 tablet 0  . lamoTRIgine (LAMICTAL) 100 MG tablet Take 100 mg by mouth 2 (two) times daily.     . ondansetron (ZOFRAN) 4 MG tablet Take 1 tablet (4 mg total) by mouth every 8 (eight) hours as needed for nausea or vomiting. 20 tablet 0  . pantoprazole (PROTONIX) 40 MG tablet Take 1 tablet (40 mg total) by mouth daily. 30 tablet 3  . traZODone (DESYREL) 50 MG tablet TAKE 1 TABLET(50 MG) BY MOUTH AT BEDTIME AS NEEDED FOR SLEEP (Patient not taking: Reported on 02/20/2017) 90 tablet 1   No current facility-administered medications on file prior to visit.    Past Medical History:  Diagnosis Date  . Anemia   . Anxiety state   . Bipolar disorder ([place])   . Cervical strain, acute, subsequent encounter 05/29/2016  . Essential hypertension, benign 06/10/2015  . Genital herpes 12/12/2011  . GERD (gastroesophageal reflux disease) 08/27/2017  . GOITER, MULTINODULAR 09/05/2007   Dr. Zada Girt Endocrinology  [place] 2011: Stable to slightly smaller bilateral thyroid nodules since 09/04/2007. No new or enlarging thyroid nodules identified. Biopsy 08/2007: non neoplastic goiter   . H/O ETOH abuse    Sober since  06/03/1992  . Hyperlipidemia 05/18/2016   Diet controlled, no meds  . Mastalgia in female 06/10/2015  . Mild depression ([place]) 12/15/2013  . OBESITY, NOS 03/21/2006       . S/P vaginal hysterectomy 01/29/2017  . SVD (spontaneous vaginal delivery)    x 1  . Tobacco abuse 09/08/2010  . Tobacco abuse    Vitals:   10/11/17 0956  BP: 140/88  Pulse: 84  Temp: 98.1 F (36.7 C)  TempSrc: Oral  SpO2: 98%  Weight: 264 lb 9.6 oz (120 kg)  Height: 5\' 6"  (1.676 m)     Review of Systems  HENT:       Ringing in her ears  Respiratory: Negative.   Cardiovascular: Negative.   Gastrointestinal: Negative.   Genitourinary: Negative.   Neurological: Positive for  headaches. Negative for dizziness.  All other systems reviewed and are negative.      Objective:   Physical Exam  Constitutional: She is oriented to person, place, and time. She appears well-developed. No distress.  Neck:    Cardiovascular: Normal rate, regular rhythm and normal heart sounds.  No murmur heard. Pulmonary/Chest: Effort normal and breath sounds normal. No stridor. No respiratory distress. She has no wheezes.  Abdominal: Soft. Bowel sounds are normal. She exhibits no distension and no mass. There is no tenderness.  Musculoskeletal: Normal range of motion. She exhibits no edema.  Neurological: She is alert and oriented to person, place, and time. She displays normal reflexes. No cranial nerve deficit or sensory deficit. She exhibits normal muscle tone. Coordination normal.  Nursing note and vitals reviewed.        Office Visit from 10/11/2017 in [place]  PHQ-9 Total Score  14      GAD 7 : Generalized Anxiety Score 10/11/2017 12/11/2016  Nervous, Anxious, on Edge 3 1  Control/stop worrying 1 1  Worry too much - different things 1 1  Trouble relaxing 2 0  Restless 1 0  Easily annoyed or irritable 3 1  Afraid - awful might happen 2 0  Total GAD 7 Score 13 4  Anxiety Difficulty Somewhat difficult -     Assessment:     Headache Neck pain Dog bite MDD Morbid Obesity    Plan:     Check problem list.

## 2017-10-11 NOTE — Assessment & Plan Note (Signed)
Diet and exercise discussed briefly. We will monitor weight loss closely. Readdress at next visit.

## 2017-10-11 NOTE — Telephone Encounter (Signed)
I called multiple times to f/u with her as planned. No response.  HIPAA compliant call back message left.   Note: When she calls, advise her to go to the Urgent care to initiate rabies vaccination, unless otherwise stated by her ED visit discharge summary.

## 2017-10-12 MED ORDER — RABIES VACCINE, PCEC IM SUSR
1.0000 mL | Freq: Once | INTRAMUSCULAR | Status: AC
Start: 2017-10-12 — End: 2017-10-12
  Administered 2017-10-12: 1 mL via INTRAMUSCULAR
  Filled 2017-10-12: qty 1

## 2017-10-12 MED ORDER — RABIES IMMUNE GLOBULIN 150 UNIT/ML IM INJ
20.0000 [IU]/kg | INJECTION | Freq: Once | INTRAMUSCULAR | Status: AC
  Administered 2017-10-12: 2400 [IU] via INTRAMUSCULAR
  Filled 2017-10-12: qty 16

## 2017-10-12 MED ORDER — ACETAMINOPHEN 500 MG PO TABS
500.0000 mg | ORAL_TABLET | Freq: Once | ORAL | Status: AC
  Administered 2017-10-12: 500 mg via ORAL

## 2017-10-12 NOTE — Discharge Instructions (Addendum)
Please return to the Emergency Department for any new or worsening symptoms or if your symptoms do not improve. Please be sure to follow up with your Primary Care Physician as soon as possible regarding your visit today. If you do not have a Primary Doctor please use the resources below to establish one. You have been started on the rabies vaccine series tonight.  Please call your primary care provider first thing tomorrow morning and inform them of this and of need for continued vaccination series for rabies. You will need repeat injections of the vaccine on day 3, day 7 and day 14.  These injections can be done at your primary care provider, an urgent care or if necessary here at the emergency department.  Contact a doctor if: You start to have any of these: A stuffy nose. Tingling in your mouth. An itchy, red rash. You have symptoms that last more than 2 days after your reaction. Your symptoms get worse. You get new symptoms. Get help right away if: You had to use your auto-injector pen. You must go to the emergency room even if the medicine seems to be working. You have any of these: Swelling in your eyes, lips, face, or tongue. Swelling in the back of your mouth or your throat. Loud breathing. A hoarse voice. Itchy, red, swollen areas of skin. Trouble with breathing, talking, or swallowing. A tight feeling in your chest. A fast heartbeat. You have throwing up that gets very bad. You have watery poop that gets very bad. You feel dizzy or light-headed. You pass out.  Do not take your medicine if  develop an itchy rash, swelling in your mouth or lips, or difficulty breathing.   RESOURCE GUIDE  Chronic Pain Problems: Contact [facility]  [phone] Patients need to be referred by their primary care doctor.  Insufficient Money for Medicine: Contact United Way:  call "211" or [place] [phone].  No Primary Care Doctor: Call [facility] Connect   [phone] - can help you locate a primary care doctor that  accepts your insurance, provides certain services, etc. Physician Referral Service- [phone]  Agencies that provide inexpensive medical care: [facility]  [facility]  [phone] [facility]  [phone] [facility]  [phone] Planned [facility]  [phone] [facility]  [phone]  [place] Providers: [facility]- [address], [address]  [phone], Mon-Fri 9am-7pm, Sat 9am-1pm [place], [address] [place]  [place], [address] [place]  [place]- [address]  [place], [address], [phone]  Only accepts [place] patients after they have their name  applied to their card  Self Pay (no insurance) in [facility]: Sickle Cell Patients: Dr Kevan Ny, [facility]  [place], [facility]- [place]  [facility] [place]- [address], [facility]- see information above (Speak to [employer] if you do not have insurance)       -  [facility]- [place], [place]  [place]rene Desan[place]is  [place]  Dr Vista Lawman-  [address], [place], [place] [facility]- [address], [phone]       -  [facility] [place]- [address], [place], also [address], [phone]       -    [facility]- [address], [place], 1st & 3rd Saturday   every month, 10am-1pm  1) Find a Doctor and Pay Out of Pocket Although you won't have to find out who is covered by  your insurance plan, it is a good idea to ask around and get recommendations. You will then need to call the office and see if the doctor you have chosen will accept you as a new patient and what types of options they offer for patients who are self-pay. Some doctors offer discounts or will set up payment plans for their patients who do not have insurance, but you will need to ask so you aren't surprised when you get to your appointment.  2) Contact Your Local Health Department Not all health departments have doctors that can see patients for sick visits, but many do, so it is worth a call to see if yours does. If you don't know where your local health department is, you can check in your phone book. The CDC also has a tool to help you locate your state's health department, and many state websites also have listings of all of their local health departments.  3) Find a [place] [facility] If your illness is not likely to be very severe or complicated, you may want to try a walk in clinic. These are popping up all over the country in pharmacies, drugstores, and shopping centers. They're usually staffed by nurse practitioners or physician assistants that have been trained to treat common illnesses and complaints. They're usually fairly quick and inexpensive. However, if you have serious medical issues or chronic medical problems, these are probably not your best option  [facility] [place], [facility], [address] [place] [address] [place], phone [phone] or [phone].  Monday - Friday, call for an appointment. [place], [facility] Clinic, [address], [place], phone [phone] or [phone].  Monday - Friday, call for an appointment.  Abuse/Neglect: [place] [phone] [place] [phone] (After Hours)  Emergency Shelter:  [facility] [phone]  Maternity Homes: Room at the [place] [phone] [place] [phone]  MRSA Hotline #:   (939)434-4321  [facility] Dept. [address] [place]  [facility] Phone:  [phone]                                  Phone:  [phone]                   Phone:  [phone]  [facility], [place]- [phone]       -     [facility] in [place], [address]                                  [phone], [place] [phone] or [phone] (After Hours)   [place]  Substance Abuse Resources: Alcohol and Drug Services  [phone] [place] [phone] [facility] [place] [phone] Chinita Pester [phone] Residential & Outpatient Substance Abuse Program  [phone]  Psychological Services: [place]  [phone] [place], [place]. [address] [place], [place]: [phone] or [phone], [email or web address]  Dental Assistance  If unable to pay or uninsured, contact:  [facility] or [facility]. to become qualified for the adult dental clinic.  Patients with Medicaid: [facility] [phone] [address], [place] [address], [phone]  If unable to pay, or uninsured, contact [facility] [phone]) or [place] [phone] in [place], [place] in [facility]) to become qualified for the adult dental clinic   Other [place]- [place], [place], [place], [address], [place], [place], 2nd and 4th Thursday of the month at 6:30am.  10 clients each day by appointment, can sometimes see walk-in patients if someone does not show for an appointment. [facility]- [address] [place], [place], [address], [place], [place], [place], [address], [place] Department- [phone] [facility] [phone]

## 2017-10-12 NOTE — ED Provider Notes (Addendum)
[facility] EMERGENCY DEPARTMENT Provider Note   CSN: [id number] Arrival date & time: 10/11/17  2255     History   Chief Complaint Chief Complaint  Patient presents with  . Rabies Injection    HPI Tracy Rose is a 52 y.o. female presenting for dog bite that occurred on 10/03/2017.  Patient states that she works for [employer] and was attacked by 1 of her clients dogs.  Patient states that she was bit on the left elbow and left heel through her shoe.  On her states that dogs are vaccinated but patient is unsure, no document to prove vaccination status of dog's.  Patient states that she was seen by [facility] emergency department on the day of the incident and by her primary care provider last week.  Patient states that wounds have healed well and that she received a tetanus shot from her doctor.  She was informed by her doctor via telephone that she should receive the rabies shots today.  Patient with no other complaints today, only requesting rabies vaccine.  Patient denies history of fever, trouble swallowing, numbness/tingling or weakness to her muscles, trouble speaking or muscle spasms. Patient denies joint swelling or pain to where she was bitten.  I have reviewed telephone encounter between patient and her primary care provider on 10/11/2017 at 4:45 PM.  Per their note they were contacted by Dr. Dierdre Highman from [facility] that who recommended that she start the rabies vaccination and to go to the urgent care. HPI  Past Medical History:  Diagnosis Date  . Anemia   . Anxiety state   . Bipolar disorder ([place])   . Cervical strain, acute, subsequent encounter 05/29/2016  . Essential hypertension, benign 06/10/2015  . Genital herpes 12/12/2011  . GERD (gastroesophageal reflux disease) 08/27/2017  . GOITER, MULTINODULAR 09/05/2007   Dr. Zada Girt Endocrinology  [place] 2011: Stable to slightly smaller bilateral thyroid nodules since 09/04/2007. No new or enlarging thyroid nodules  identified. Biopsy 08/2007: non neoplastic goiter   . H/O ETOH abuse    Sober since 06/03/1992  . Hyperlipidemia 05/18/2016   Diet controlled, no meds  . Mastalgia in female 06/10/2015  . Mild depression ([place]) 12/15/2013  . OBESITY, NOS 03/21/2006       . S/P vaginal hysterectomy 01/29/2017  . SVD (spontaneous vaginal delivery)    x 1  . Tobacco abuse 09/08/2010  . Tobacco abuse     Patient Active Problem List   Diagnosis Date Noted  . Dog bite 10/11/2017  . GERD (gastroesophageal reflux disease) 08/27/2017  . Hyperlipidemia 05/18/2016  . Breast mass, left 10/28/2015  . MDD (major depressive disorder), recurrent, severe, with psychosis ([place]) 08/06/2015  . Severe manic bipolar 1 disorder with psychotic behavior ([place]) 08/05/2015  . Essential hypertension, benign 06/10/2015  . ADHD (attention deficit hyperactivity disorder) 08/15/2014  . Headache 03/19/2014  . Cervicalgia 09/16/2013  . Menorrhagia 08/19/2013  . Palpitations 04/07/2013  . Macromastia 04/07/2013  . Genital herpes 12/12/2011  . Tobacco abuse 09/08/2010  . GOITER, MULTINODULAR 09/05/2007  . Morbid obesity ([place]) 03/21/2006    Past Surgical History:  Procedure Laterality Date  . BREAST BIOPSY Left 06/22/2015  . BREAST EXCISIONAL BIOPSY Left 09/21/2015  . BREAST LUMPECTOMY WITH RADIOACTIVE SEED LOCALIZATION Left 09/21/2015   Procedure: LEFT BREAST LUMPECTOMY WITH RADIOACTIVE SEED LOCALIZATION;  Surgeon: Donnie Mesa, MD;  Location: [facility];  Service: General;  Laterality: Left;  . CERVICAL CERCLAGE     x 1  . RADIOLOGY  WITH ANESTHESIA N/A 01/26/2014   Procedure: MRA NECK, MRI BRAIN WITH AND WITHOUT CONTRAST ;  Surgeon: Medication Radiologist, MD;  Location: [facility];  Service: Radiology;  Laterality: N/A;  . VAGINAL HYSTERECTOMY Bilateral 01/29/2017   Procedure: HYSTERECTOMY VAGINAL WITH SALPINGECTOMY;  Surgeon: Donnamae Jude, MD;  Location: [facility];  Service: Gynecology;  Laterality: Bilateral;  . WISDOM TOOTH EXTRACTION      @ age 70     OB History    Gravida  4   Para  2   Term  2   Preterm  0   AB  2   Living  1     SAB  0   TAB  2   Ectopic  0   Multiple  0   Live Births               Home Medications    Prior to Admission medications   Medication Sig Start Date End Date Taking? Authorizing Provider  acyclovir (ZOVIRAX) 400 MG tablet TAKE 1 TABLET(400 MG) BY MOUTH TWICE DAILY Patient not taking: Reported on 03/01/2017 11/09/16   Kinnie Feil, MD  amLODipine (NORVASC) 10 MG tablet TAKE 1 TABLET(5 MG) BY MOUTH DAILY 09/12/17   Kathrene Alu, MD  aspirin 81 MG chewable tablet Chew 81 mg by mouth daily as needed for mild pain.    [provider]  baclofen 5 MG TABS Take 5 mg by mouth 2 (two) times daily for 7 days. 10/11/17 10/18/17  Kinnie Feil, MD  buPROPion (WELLBUTRIN SR) 150 MG 12 hr tablet Take 150 mg by mouth 2 (two) times daily. Take 1 tablet in the morning and 1 tablet at 2pm    [provider]  cholecalciferol (VITAMIN D) 1000 units tablet Take 2,000 Units by mouth daily.    [provider]  Cyanocobalamin (VITAMIN B-12) 5000 MCG TBDP Take 10,000 mcg by mouth daily.    [provider]  ferrous sulfate 325 (65 FE) MG tablet Take 1 tablet (325 mg total) by mouth 2 (two) times daily with a meal. Patient not taking: Reported on 03/01/2017 10/31/16   Kinnie Feil, MD  hydrochlorothiazide (HYDRODIURIL) 12.5 MG tablet TAKE 1 TABLET(12.5 MG) BY MOUTH DAILY FOR HIGH BLOOD PRESSURE 09/12/17   Kathrene Alu, MD  ibuprofen (ADVIL,MOTRIN) 400 MG tablet Take 1 tablet (400 mg total) by mouth every 8 (eight) hours as needed for up to 7 days for headache. 10/11/17 10/18/17  Kinnie Feil, MD  lamoTRIgine (LAMICTAL) 100 MG tablet Take 100 mg by mouth 2 (two) times daily.     [provider]  ondansetron (ZOFRAN) 4 MG tablet Take 1 tablet (4 mg total) by mouth every 8 (eight) hours as needed for nausea or vomiting. 08/20/17   Lovenia Kim, MD  pantoprazole (PROTONIX) 40 MG tablet Take 1 tablet (40 mg total) by mouth daily. 08/20/17   Lovenia Kim, MD  traZODone (DESYREL) 50 MG tablet TAKE 1 TABLET(50 MG) BY MOUTH AT BEDTIME AS NEEDED FOR SLEEP Patient not taking: Reported on 02/20/2017 12/03/16   Kinnie Feil, MD    Family History Family History  Problem Relation Age of Onset  . Arthritis Mother   . Diabetes Father   . Arthritis Father   . Hypertension Father   . Hyperlipidemia Father   . Heart disease Father   . Stroke Father   . Cancer Maternal Grandfather   . Cancer Sister   . Migraines Neg  Hx     Social History Social History   Tobacco Use  . Smoking status: Current Every Day Smoker    Packs/day: 0.25    Years: 25.00    Pack years: 6.25    Types: Cigarettes  . Smokeless tobacco: Never Used  Substance Use Topics  . Alcohol use: No    Alcohol/week: 0.0 standard drinks    Comment: Sober since 06/03/1992  . Drug use: No     Allergies   Dilaudid [hydromorphone hcl] and No known allergies   Review of Systems Review of Systems  Constitutional: Negative.  Negative for chills, fatigue and fever.  HENT: Negative.  Negative for facial swelling, sore throat and trouble swallowing.   Gastrointestinal: Negative.  Negative for abdominal pain, diarrhea, nausea and vomiting.  Musculoskeletal: Negative.  Negative for arthralgias, myalgias and neck pain.  Skin: Negative for color change.       Healing wounds.  Neurological: Negative.  Negative for dizziness, syncope, weakness, numbness and headaches.    Physical Exam Updated Vital Signs BP (!) 119/96 (BP Location: Right Arm)   Pulse 92   Temp 97.7 F (36.5 C) (Oral)   Resp 16   LMP  (LMP Unknown)   SpO2 98%   Physical Exam  Constitutional: She is oriented to person, place, and time. She appears well-developed and well-nourished. No distress.  HENT:  Head: Normocephalic and atraumatic.  Right Ear: External ear normal.  Left Ear: External  ear normal.  Nose: Nose normal.  Eyes: Pupils are equal, round, and reactive to light. EOM are normal.  Neck: Trachea normal and normal range of motion. No tracheal deviation present.  Pulmonary/Chest: Effort normal. No respiratory distress.  Abdominal: Soft. There is no tenderness. There is no rebound and no guarding.  Musculoskeletal: Normal range of motion.       Right elbow: Normal.      Left elbow: Normal. She exhibits normal range of motion, no swelling and no deformity. No tenderness found.       Right wrist: Normal.       Left wrist: Normal.       Right ankle: Normal.       Left ankle: Normal.  Neurological: She is alert and oriented to person, place, and time.  Skin: Skin is warm and dry.     Psychiatric: She has a normal mood and affect. Her behavior is normal.     ED Treatments / Results  Labs (all labs ordered are listed, but only abnormal results are displayed) Labs Reviewed - No data to display  EKG None  Radiology No results found.  Procedures Procedures (including critical care time)  Medications Ordered in ED Medications  acetaminophen (TYLENOL) tablet 500 mg (500 mg Oral Given 10/12/17 0101)  rabies immune globulin (HYPERAB/KEDRAB) injection 2,400 Units (2,400 Units Intramuscular Given 10/12/17 0226)  rabies vaccine (RABAVERT) injection 1 mL (1 mL Intramuscular Given 10/12/17 0226)     Initial Impression / Assessment and Plan / ED Course  I have reviewed the triage vital signs and the nursing notes.  Pertinent labs & imaging results that were available during my care of the patient were reviewed by me and considered in my medical decision making (see chart for details).  Clinical Course as of Oct 12 245  Sat Oct 12, 2017  0045 Case discussed with Dr. Ralene Bathe who recommends administering rabies vaccine and immunoglobulin.   [BM]  0120 Consult called to pharmacist, agrees that patient should receive both rabies vaccine and  immunoglobulin.  Immunoglobulin  based on actual weight today.   [BM]  0130 Patient requesting Tylenol for right heel pain.   [BM]    Clinical Course User Index [BM] Deliah Boston, PA-C   Patient presenting at request of her primary care provider for initiation of rabies vaccine.  Patient was attacked by dogs in a home on 10/03/2017. Patient unsure of animal vaccination status.  Injuries from this encounter have healed.  No signs of infection to left elbow or right heel.  No swelling, erythema, drainage or increased warmth.  These areas are well-appearing.  Patient is afebrile, not tachycardic, not hypotensive, well-appearing and in no acute distress at time of evaluation.  Patient denies systemic symptoms at this time including history of fever, confusion, nausea/vomiting, visual disturbances, headache or weakness.  Patient's only concern today is need for rabies vaccination. Case has been discussed with Dr. Ralene Bathe as well as our pharmacist today who both recommend initiation of rabies vaccine and administration of rabies immunoglobulin.  I have reviewed patient's most recent note from her primary care provider, a telephone conversation between the 2 which reports that they spoke with a Dr. Bobby Rumpf from [facility] who recommends starting vaccine schedule today.  Vaccines has been administered by nursing staff.  Patient has been informed that she needs to complete the series with additional vaccinations over the next few days to 2 weeks.  Patient informed to call her primary care provider first thing tomorrow morning to schedule follow-up appointment and continue schedule of vaccinations.  At this time there does not appear to be any evidence of an acute emergency medical condition and the patient appears stable for discharge with appropriate outpatient follow up. Diagnosis was discussed with patient who verbalizes understanding of care plan and is agreeable to discharge. I have discussed return precautions with patient and family at bedside  who verbalize understanding of return precautions. Patient strongly encouraged to follow-up with their PCP. All questions answered.  Patient's case discussed with Dr. Ralene Bathe who agrees with plan to discharge with follow-up.     Note: Portions of this report may have been transcribed using voice recognition software. Every effort was made to ensure accuracy; however, inadvertent computerized transcription errors may still be present.  Final Clinical Impressions(s) / ED Diagnoses   Final diagnoses:  Need for rabies vaccination    ED Discharge Orders    None       Deliah Boston, PA-C 10/12/17 0247    Deliah Boston, PA-C 10/12/17 0248    Quintella Reichert, MD 10/12/17 [phone]

## 2017-10-12 NOTE — ED Notes (Signed)
Patient left at this time with all belongings. 

## 2017-10-13 DIAGNOSIS — S060X9A Concussion with loss of consciousness of unspecified duration, initial encounter: Secondary | ICD-10-CM

## 2017-10-13 DIAGNOSIS — S060XAA Concussion with loss of consciousness status unknown, initial encounter: Secondary | ICD-10-CM

## 2017-10-13 HISTORY — DX: Concussion with loss of consciousness of unspecified duration, initial encounter: S06.0X9A

## 2017-10-13 HISTORY — DX: Concussion with loss of consciousness status unknown, initial encounter: S06.0XAA

## 2017-10-15 ENCOUNTER — Other Ambulatory Visit: Payer: Self-pay | Admitting: Family Medicine

## 2017-10-15 ENCOUNTER — Telehealth: Payer: Self-pay

## 2017-10-15 MED ORDER — SUMATRIPTAN SUCCINATE 50 MG PO TABS: ORAL_TABLET | ORAL | 0 refills | Status: DC

## 2017-10-15 NOTE — Telephone Encounter (Signed)
I will call in Imitrex

## 2017-10-15 NOTE — Telephone Encounter (Signed)
Pt contacted and informed of this.

## 2017-10-15 NOTE — Telephone Encounter (Signed)
Pt called nurse line requesting something stronger for her headaches. Pt stated, "the naproxen isn't helping, Im siting here in the dark and my ears are ringing." I informed pt if she was hurting that bad and had blurry vision she should go to the ED. Pt stated, "I cant afford those fees, I just want something stronger for the pain." Please advise.

## 2017-10-17 ENCOUNTER — Ambulatory Visit (INDEPENDENT_AMBULATORY_CARE_PROVIDER_SITE_OTHER): Payer: Medicaid Other | Admitting: Family Medicine

## 2017-10-17 ENCOUNTER — Encounter: Payer: Self-pay | Admitting: Family Medicine

## 2017-10-17 ENCOUNTER — Other Ambulatory Visit: Payer: Self-pay

## 2017-10-17 DIAGNOSIS — F0781 Postconcussional syndrome: Secondary | ICD-10-CM | POA: Diagnosis not present

## 2017-10-17 DIAGNOSIS — G44329 Chronic post-traumatic headache, not intractable: Secondary | ICD-10-CM | POA: Diagnosis not present

## 2017-10-17 DIAGNOSIS — M5416 Radiculopathy, lumbar region: Secondary | ICD-10-CM | POA: Insufficient documentation

## 2017-10-17 DIAGNOSIS — M542 Cervicalgia: Secondary | ICD-10-CM

## 2017-10-17 HISTORY — DX: Radiculopathy, lumbar region: M54.16

## 2017-10-17 MED ORDER — GABAPENTIN 100 MG PO CAPS: 100.0000 mg | ORAL_CAPSULE | Freq: Three times a day (TID) | ORAL | 3 refills | Status: DC

## 2017-10-17 MED ORDER — BACLOFEN 5 MG PO TABS: 5.0000 mg | ORAL_TABLET | Freq: Two times a day (BID) | ORAL | 0 refills | Status: AC

## 2017-10-17 NOTE — Patient Instructions (Signed)
Make an appointment with Dr. Gwendlyn Deutscher in a week or so. Someone should call to schedule 1. MRI of brain and neck 2. Physical therapy I will call with results as they come in. I sent in one new prescription to help with pain. There is no easy, quick fix here.

## 2017-10-17 NOTE — Progress Notes (Signed)
Subjective:   Patient ID: Tracy Rose    DOB: 1965/04/22, 52 y.o. female   MRN: [id number]  Chief Complaint: follow up visit for dog attack  History of Present Illness: Tracy Rose is a 52 y.o. female who presents to clinic today for continued pain after  unprovoked dog attack 10/03/2017 while working. 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 Patient saw the dogs after the customer answered the door. She explained the customer that she would fell comfortable leaving the dogs in the house while they stood on the porch of the house to speak. Patient asked for the customer's ID for verification. Customer said that she would let the dogs out of the house while she went into to retreive her ID. Patient asked the customer to the leave the dogs in the house. When patient opened the door to go inside, the dogs ran out of the house and attacked the patient. Patient reports stumbling back onto the lowest step and falling to the ground as one of the dogs jumped on her chest. The customer's roommate came out of the house to get the dog off of her chest. Patient reports bites to her right heel and  Patient was seen at [facility] ED; accompanied by her supervisor. She received imaging  Vaccination status. Patient has completed antibiotics, tetanus shot, and rabies vaccination.  Patient is requesting MRI   HEADACHE  Onset: since 10/03/2017 Location: left sided headache Description: shooting pain, radiation down into neck; constant cramps in your head Modifying factors: noise, lights  Treatment: sumatriptan, muscle relaxers, naproxen, ibuprofen; mild relief   Blurred vision, irritability, lack of focus, shooting pain   Symptoms Nausea/vomiting: twice since the attack Photophobia:  Phonophobia:  Tearing of eyes:  Sinus pain/pressure:  Relation to menstrual cycle:  Tinnitus: left ear   Red Flags Fever: no Neck pain/stiffness: yes Vision/speech difficulty:  Focal weakness  or numbness: weakness, numbness on left arm and  Altered mental status:  Trauma: recent dog attack Worse in am:  Anticoagulant use:  Immunocompromise:   Family history of Migraine:   History of concussion 3 years ago direct blow to head  History of chronic headaches after the accident  Frontal swelling, head pain  MRI Aug-Jan pain  didn't go back to work until the following Aug  Left back pain/neck pain/shoulder pain Location: pain at lower back, described as knot Onset:  Description: numbness stiffness Modifying factors:   Symptoms Worse with: walking  Better with: medications, ice, patient endorses  Trauma:   Red Flags Fecal/urinary incontinence:  Weakness:  Fever/chills:  Night pain:  Unexplained weight loss:  No relief with bedrest:  Cancer/immunosuppression:  IV drug use:  PMH of osteoporosis or chronic steroid use:   Review of Systems  Constitutional: Negative for fever.  Eyes: Positive for blurred vision.  Gastrointestinal: Positive for nausea and vomiting.  Musculoskeletal: Positive for back pain, joint pain, myalgias and neck pain.  Neurological: Negative for headaches.    [place]: see epic   Past Medical History:  Diagnosis Date  . Anemia   . Anxiety state   . Bipolar disorder ([place])   . Cervical strain, acute, subsequent encounter 05/29/2016  . Essential hypertension, benign 06/10/2015  . Genital herpes 12/12/2011  . GERD (gastroesophageal reflux disease) 08/27/2017  . GOITER, MULTINODULAR 09/05/2007   Dr. Zada Girt Endocrinology  [place] 2011: Stable to slightly smaller bilateral thyroid nodules since 09/04/2007. No new or enlarging thyroid nodules identified. Biopsy  08/2007: non neoplastic goiter   . H/O ETOH abuse    Sober since 06/03/1992  . Hyperlipidemia 05/18/2016   Diet controlled, no meds  . Mastalgia in female 06/10/2015  . Mild depression ([place]) 12/15/2013  . OBESITY, NOS 03/21/2006       . S/P vaginal hysterectomy 01/29/2017  . SVD  (spontaneous vaginal delivery)    x 1  . Tobacco abuse 09/08/2010  . Tobacco abuse      Current Outpatient Medications  Medication Sig Dispense Refill  . acyclovir (ZOVIRAX) 400 MG tablet TAKE 1 TABLET(400 MG) BY MOUTH TWICE DAILY (Patient not taking: Reported on 03/01/2017) 60 tablet 3  . amLODipine (NORVASC) 10 MG tablet TAKE 1 TABLET(5 MG) BY MOUTH DAILY 30 tablet 11  . aspirin 81 MG chewable tablet Chew 81 mg by mouth daily as needed for mild pain.    . baclofen 5 MG TABS Take 5 mg by mouth 2 (two) times daily for 7 days. 14 tablet 0  . buPROPion (WELLBUTRIN SR) 150 MG 12 hr tablet Take 150 mg by mouth 2 (two) times daily. Take 1 tablet in the morning and 1 tablet at 2pm    . cholecalciferol (VITAMIN D) 1000 units tablet Take 2,000 Units by mouth daily.    . Cyanocobalamin (VITAMIN B-12) 5000 MCG TBDP Take 10,000 mcg by mouth daily.    . ferrous sulfate 325 (65 FE) MG tablet Take 1 tablet (325 mg total) by mouth 2 (two) times daily with a meal. (Patient not taking: Reported on 03/01/2017) 60 tablet 2  . hydrochlorothiazide (HYDRODIURIL) 12.5 MG tablet TAKE 1 TABLET(12.5 MG) BY MOUTH DAILY FOR HIGH BLOOD PRESSURE 30 tablet 11  . ibuprofen (ADVIL,MOTRIN) 400 MG tablet Take 1 tablet (400 mg total) by mouth every 8 (eight) hours as needed for up to 7 days for headache. 20 tablet 0  . lamoTRIgine (LAMICTAL) 100 MG tablet Take 100 mg by mouth 2 (two) times daily.     . ondansetron (ZOFRAN) 4 MG tablet Take 1 tablet (4 mg total) by mouth every 8 (eight) hours as needed for nausea or vomiting. 20 tablet 0  . pantoprazole (PROTONIX) 40 MG tablet Take 1 tablet (40 mg total) by mouth daily. 30 tablet 3  . SUMAtriptan (IMITREX) 50 MG tablet Take one tablet prn migraine headache. May repeat in 2 hours once if headache persists or recurs. 10 tablet 0  . traZODone (DESYREL) 50 MG tablet TAKE 1 TABLET(50 MG) BY MOUTH AT BEDTIME AS NEEDED FOR SLEEP (Patient not taking: Reported on 02/20/2017) 90 tablet 1   No  current facility-administered medications for this visit.     Objective:   BP 118/80   Pulse 88   Temp 98.2 F (36.8 C) (Oral)   Ht 5\' 6"  (1.676 m)   Wt 263 lb 6.4 oz (119.5 kg)   LMP  (LMP Unknown)   SpO2 96%   BMI 42.51 kg/m   Physical Exam  Neurological:  Normal reflexes Normal strength to upper extremities  Normal coordination  Skin: Skin is warm.  hyperasthesia Healing Abrasion to left shoulder at level of the spinous process  Nursing note and vitals reviewed.     Assessment & Plan:  BRIONNE MERTZ is a 52 y.o. female who presents to clinic today See problem list.  *check kidneys  No problem-specific Assessment & Plan notes found for this encounter.  No orders of the defined types were placed in this encounter.  No orders of the defined types  were placed in this encounter.   Mercy Moore, [facility] 10/17/2017 10:16 AM   I was personally present and/or repeated all elements of the H&PE by [place].  I agree with her documentation and management.  Briefly,  Patient was working Psychologist, counselling.  The patient was on the first step although she thought she was on level ground.  The customer, despite repeated requests not to, let her dogs out.  The dogs jumped on Tracy Rose causing her to step backward.  The unexpected step down and the dogs caused Tracy Rose to fall striking her back and head.  Tracy Griffeth did not lose consciousness but did see stars.  This was two weeks ago.  She was reportedly evaluated at [facility] ER and had imaging the day of the accident.  I cannot access those records in care everywhere.  She has also had two follow up visits.   Important PMH - had a concussion at work a few years ago and reportedly took one year to get back to work.  Issues/complaints.  Continued headache.  I believe she had a concussion and this fits with post concussive syndrome.  She has no objective neurologic findings on exam.  Neck pain  with left arm pain, numbness and weakness.  Review of old films shows previous DDD.  She is stiff with limited ROM of neck.  Motor and reflexes are intact in both upper extremities.  Subjective decreased sensation of left arm.  Hyperesthesia at midline neck, both sides of neck extensors and shoulders.    Low back pain with left leg numbness and pain.  States pain goes down lateral aspect of leg to toes.  Hyperesthesia of paraspinous muscles bilaterally.  Decrease flexion/extension of low back  Of note, she was a same day visit for me - I am not her usual PCP.  Made no mention of other doctor visit.  I was thus surprised when I received a call that afternoon from an occupational specialist, for a regular appoitnment she had for workmans comp.  Unclear to me which of my recommendations will be done through the [facility] (at risk of patient being responsible for the bill) and which will be done through the [facility].

## 2017-10-18 ENCOUNTER — Encounter: Payer: Self-pay | Admitting: Family Medicine

## 2017-10-18 NOTE — Assessment & Plan Note (Signed)
Low back pain with sx consistent with radiculopathy.  Lack of objective findings on exam is reassuring.  Start with plain films.  The most important intervention for all these problems is physical therapy.

## 2017-10-18 NOTE — Assessment & Plan Note (Signed)
With sx compatable with left arm radiculopathy.  Known prior neck disorders.  Also add gabapentin for hyperesthesia and neuropathic pain.

## 2017-10-18 NOTE — Assessment & Plan Note (Signed)
Feel an MRI of the brain is indicated given continued sx at two weeks.  Lack of focal neuro sx are reassuring.

## 2017-10-22 ENCOUNTER — Ambulatory Visit: Payer: Medicaid Other | Attending: Family Medicine | Admitting: Physical Therapy

## 2017-10-22 ENCOUNTER — Telehealth: Payer: Self-pay | Admitting: Physical Therapy

## 2017-10-22 NOTE — Telephone Encounter (Signed)
Pt. Did not show for 9:30 eval. Called pt. To check on status. Pt. Reports case is now covered by worker's comp and will now be going to another therapy provider per insurance. No PT visits planned at this facility. Beaulah Dinning, PT, DPT 10/22/17 10:12 AM

## 2017-10-29 ENCOUNTER — Ambulatory Visit (INDEPENDENT_AMBULATORY_CARE_PROVIDER_SITE_OTHER): Payer: Medicaid Other | Admitting: Family Medicine

## 2017-10-29 ENCOUNTER — Encounter: Payer: Self-pay | Admitting: Family Medicine

## 2017-10-29 ENCOUNTER — Other Ambulatory Visit: Payer: Self-pay

## 2017-10-29 DIAGNOSIS — F430 Acute stress reaction: Secondary | ICD-10-CM | POA: Diagnosis not present

## 2017-10-29 DIAGNOSIS — G4489 Other headache syndrome: Secondary | ICD-10-CM | POA: Diagnosis present

## 2017-10-29 NOTE — Progress Notes (Signed)
Subjective:     Patient ID: Tracy Rose, female   DOB: [birth year], 52 y.o.   MRN: [id number]  HPI Headache: Some improvement with her symptoms. Noise still bothers her and she has been tripping and falling more often than her usual. No other concern. She is seeing her workmans comp physician for this who plans on getting MRI of the brain done. Her headache is minimal today. Anxiety: Since she had dog attack this has been an issue. She gets irritated easily, she is afraid to go out. Denies suicidal or homicidal ideation. She will like to get treatment for this. She is compliant with her Psych f/u but is requesting psychology referral.    Current Outpatient Medications on File Prior to Visit  Medication Sig Dispense Refill  . acyclovir (ZOVIRAX) 400 MG tablet TAKE 1 TABLET(400 MG) BY MOUTH TWICE DAILY 60 tablet 3  . amLODipine (NORVASC) 10 MG tablet TAKE 1 TABLET(5 MG) BY MOUTH DAILY 30 tablet 11  . aspirin 81 MG chewable tablet Chew 81 mg by mouth daily as needed for mild pain.    Marland Kitchen buPROPion (WELLBUTRIN SR) 150 MG 12 hr tablet Take 150 mg by mouth 2 (two) times daily. Take 1 tablet in the morning and 1 tablet at 2pm    . gabapentin (NEURONTIN) 100 MG capsule Take 1 capsule (100 mg total) by mouth 3 (three) times daily. 90 capsule 3  . hydrochlorothiazide (HYDRODIURIL) 12.5 MG tablet TAKE 1 TABLET(12.5 MG) BY MOUTH DAILY FOR HIGH BLOOD PRESSURE 30 tablet 11  . lamoTRIgine (LAMICTAL) 100 MG tablet Take 100 mg by mouth 2 (two) times daily.     . cholecalciferol (VITAMIN D) 1000 units tablet Take 2,000 Units by mouth daily.    . Cyanocobalamin (VITAMIN B-12) 5000 MCG TBDP Take 10,000 mcg by mouth daily.    . ferrous sulfate 325 (65 FE) MG tablet Take 1 tablet (325 mg total) by mouth 2 (two) times daily with a meal. (Patient not taking: Reported on 10/29/2017) 60 tablet 2  . ondansetron (ZOFRAN) 4 MG tablet Take 1 tablet (4 mg total) by mouth every 8 (eight) hours as needed for nausea or  vomiting. 20 tablet 0  . SUMAtriptan (IMITREX) 50 MG tablet Take one tablet prn migraine headache. May repeat in 2 hours once if headache persists or recurs. (Patient not taking: Reported on 10/29/2017) 10 tablet 0   No current facility-administered medications on file prior to visit.    Past Medical History:  Diagnosis Date  . Anemia   . Anxiety state   . Bipolar disorder ([place])   . Cervical strain, acute, subsequent encounter 05/29/2016  . Essential hypertension, benign 06/10/2015  . Genital herpes 12/12/2011  . GERD (gastroesophageal reflux disease) 08/27/2017  . GOITER, MULTINODULAR 09/05/2007   Dr. Zada [facility]  [place] 2011: Stable to slightly smaller bilateral thyroid nodules since 09/04/2007. No new or enlarging thyroid nodules identified. Biopsy 08/2007: non neoplastic goiter   . H/O ETOH abuse    Sober since 06/03/1992  . Hyperlipidemia 05/18/2016   Diet controlled, no meds  . Mastalgia in female 06/10/2015  . Mild depression ([place]) 12/15/2013  . OBESITY, NOS 03/21/2006       . S/P vaginal hysterectomy 01/29/2017  . SVD (spontaneous vaginal delivery)    x 1  . Tobacco abuse 09/08/2010  . Tobacco abuse    Vitals:   10/29/17 0932  BP: 118/74  Pulse: 83  Temp: 98.2 F (36.8 C)  TempSrc: Oral  SpO2:  99%  Weight: 265 lb (120.2 kg)  Height: 5\' 6"  (1.676 m)    Review of Systems  HENT: Negative.   Respiratory: Negative.   Cardiovascular: Negative.   Gastrointestinal: Negative.   Musculoskeletal: Negative.   Neurological: Positive for headaches. Negative for seizures.  Hematological: Negative.   All other systems reviewed and are negative.      Objective:   Physical Exam  Constitutional: She is oriented to person, place, and time. She appears well-developed. She does not appear ill.  Cardiovascular: Normal rate, regular rhythm and normal heart sounds.  No murmur heard. Pulmonary/Chest: Effort normal and breath sounds normal. No respiratory distress. She has no  wheezes.  Abdominal: Soft. She exhibits no distension and no mass.  Musculoskeletal: Normal range of motion. She exhibits no edema.  Neurological: She is alert and oriented to person, place, and time. She has normal strength and normal reflexes. She is not disoriented. No cranial nerve deficit or sensory deficit. Coordination and gait normal. She displays no Babinski's sign on the right side. She displays no Babinski's sign on the left side.  Psychiatric: Her speech is normal and behavior is normal. Thought content normal. Her mood appears not anxious. Cognition and memory are normal.  Nursing note and vitals reviewed.      Assessment:     Headache Anxiety/Acute stress disorder    Plan:     Check problem list.

## 2017-10-29 NOTE — Patient Instructions (Signed)
Stress and Stress Management Stress is a normal reaction to life events. It is what you feel when life demands more than you are used to or more than you can handle. Some stress can be useful. For example, the stress reaction can help you catch the last bus of the day, study for a test, or meet a deadline at work. But stress that occurs too often or for too long can cause problems. It can affect your emotional health and interfere with relationships and normal daily activities. Too much stress can weaken your immune system and increase your risk for physical illness. If you already have a medical problem, stress can make it worse. What are the causes? All sorts of life events may cause stress. An event that causes stress for one person may not be stressful for another person. Major life events commonly cause stress. These may be positive or negative. Examples include losing your job, moving into a new home, getting married, having a baby, or losing a loved one. Less obvious life events may also cause stress, especially if they occur day after day or in combination. Examples include working long hours, driving in traffic, caring for children, being in debt, or being in a difficult relationship. What are the signs or symptoms? Stress may cause emotional symptoms including, the following:  Anxiety. This is feeling worried, afraid, on edge, overwhelmed, or out of control.  Anger. This is feeling irritated or impatient.  Depression. This is feeling sad, down, helpless, or guilty.  Difficulty focusing, remembering, or making decisions.  Stress may cause physical symptoms, including the following:  Aches and pains. These may affect your head, neck, back, stomach, or other areas of your body.  Tight muscles or clenched jaw.  Low energy or trouble sleeping.  Stress may cause unhealthy behaviors, including the following:  Eating to feel better (overeating) or skipping meals.  Sleeping too little,  too much, or both.  Working too much or putting off tasks (procrastination).  Smoking, drinking alcohol, or using drugs to feel better.  How is this diagnosed? Stress is diagnosed through an assessment by your health care provider. Your health care provider will ask questions about your symptoms and any stressful life events.Your health care provider will also ask about your medical history and may order blood tests or other tests. Certain medical conditions and medicine can cause physical symptoms similar to stress. Mental illness can cause emotional symptoms and unhealthy behaviors similar to stress. Your health care provider may refer you to a mental health professional for further evaluation. How is this treated? Stress management is the recommended treatment for stress.The goals of stress management are reducing stressful life events and coping with stress in healthy ways. Techniques for reducing stressful life events include the following:  Stress identification. Self-monitor for stress and identify what causes stress for you. These skills may help you to avoid some stressful events.  Time management. Set your priorities, keep a calendar of events, and learn to say "no." These tools can help you avoid making too many commitments.  Techniques for coping with stress include the following:  Rethinking the problem. Try to think realistically about stressful events rather than ignoring them or overreacting. Try to find the positives in a stressful situation rather than focusing on the negatives.  Exercise. Physical exercise can release both physical and emotional tension. The key is to find a form of exercise you enjoy and do it regularly.  Relaxation techniques. These relax the body and  mind. Examples include yoga, meditation, tai chi, biofeedback, deep breathing, progressive muscle relaxation, listening to music, being out in nature, journaling, and other hobbies. Again, the key is to find  one or more that you enjoy and can do regularly.  Healthy lifestyle. Eat a balanced diet, get plenty of sleep, and do not smoke. Avoid using alcohol or drugs to relax.  Strong support network. Spend time with family, friends, or other people you enjoy being around.Express your feelings and talk things over with someone you trust.  Counseling or talktherapy with a mental health professional may be helpful if you are having difficulty managing stress on your own. Medicine is typically not recommended for the treatment of stress.Talk to your health care provider if you think you need medicine for symptoms of stress. Follow these instructions at home:  Keep all follow-up visits as directed by your health care provider.  Take all medicines as directed by your health care provider. Contact a health care provider if:  Your symptoms get worse or you start having new symptoms.  You feel overwhelmed by your problems and can no longer manage them on your own. Get help right away if:  You feel like hurting yourself or someone else. This information is not intended to replace advice given to you by your health care provider. Make sure you discuss any questions you have with your health care provider. Document Released: 07/04/2000 Document Revised: 06/16/2015 Document Reviewed: 09/02/2012 Elsevier Interactive Patient Education  2017 Elsevier Inc.  

## 2017-10-29 NOTE — Assessment & Plan Note (Signed)
Minimal improvement from last visit. Per patient she is getting MRI brain through her workmans' comp doctor. No neurologic deficit on exam today. May use Tylenol as needed for pain.ED visit if symptoms worsens.

## 2017-10-29 NOTE — Assessment & Plan Note (Addendum)
S/P dog attack/bite. She seems to be still pretty stressed out about the event. I recommended f/u with her current psychiatrist. She is requesting referral to a different psychologist. She does not want to be evaluated by her [facility] provider as well since they offer limited minutes for counseling. Although she does not want to work with our [facility], I made an attempt to discuss the patient during this visit with them but the covering provider was not available at this time. I gave her list of Psychologist in town to contact for an appointment. F/U soon if symptoms worsens or go to the ED.

## 2017-11-12 ENCOUNTER — Other Ambulatory Visit (HOSPITAL_COMMUNITY): Payer: Self-pay | Admitting: Emergency Medicine

## 2017-11-12 DIAGNOSIS — M542 Cervicalgia: Secondary | ICD-10-CM

## 2017-11-12 DIAGNOSIS — F0781 Postconcussional syndrome: Secondary | ICD-10-CM

## 2017-11-12 DIAGNOSIS — G44329 Chronic post-traumatic headache, not intractable: Secondary | ICD-10-CM

## 2017-11-15 ENCOUNTER — Other Ambulatory Visit: Payer: Self-pay | Admitting: Family Medicine

## 2017-11-15 DIAGNOSIS — Z1231 Encounter for screening mammogram for malignant neoplasm of breast: Secondary | ICD-10-CM

## 2017-12-13 ENCOUNTER — Other Ambulatory Visit: Payer: Self-pay

## 2017-12-13 ENCOUNTER — Encounter (HOSPITAL_COMMUNITY): Payer: Self-pay | Admitting: *Deleted

## 2017-12-13 NOTE — Progress Notes (Signed)
Spoke with pt for pre-op call. Pt denies cardiac history and diabetes.

## 2017-12-16 NOTE — Anesthesia Preprocedure Evaluation (Addendum)
Anesthesia Evaluation  Patient identified by MRN, date of birth, ID band Patient awake    Reviewed: Allergy & Precautions, NPO status , Patient's Chart, lab work & pertinent test results  Airway Mallampati: III  TM Distance: >3 FB Neck ROM: Full    Dental no notable dental hx. (+) Missing, Dental Advisory Given,    Pulmonary neg pulmonary ROS, former smoker,    Pulmonary exam normal breath sounds clear to auscultation       Cardiovascular hypertension, negative cardio ROS Normal cardiovascular exam Rhythm:Regular Rate:Normal     Neuro/Psych  Headaches, PSYCHIATRIC DISORDERS Anxiety Depression Bipolar Disorder    GI/Hepatic negative GI ROS, Neg liver ROS,   Endo/Other  Morbid obesity  Renal/GU negative Renal ROS  negative genitourinary   Musculoskeletal negative musculoskeletal ROS (+)   Abdominal   Peds  Hematology  (+) Blood dyscrasia, anemia ,   Anesthesia Other Findings Headaches, head trauma 09/2017  Reproductive/Obstetrics                            Anesthesia Physical Anesthesia Plan  ASA: III  Anesthesia Plan: General   Post-op Pain Management:    Induction: Intravenous  PONV Risk Score and Plan: 3 and Midazolam, Dexamethasone and Ondansetron  Airway Management Planned: Oral ETT  Additional Equipment:   Intra-op Plan:   Post-operative Plan: Extubation in OR  Informed Consent: I have reviewed the patients History and Physical, chart, labs and discussed the procedure including the risks, benefits and alternatives for the proposed anesthesia with the patient or authorized representative who has indicated his/her understanding and acceptance.   Dental advisory given  Plan Discussed with: CRNA  Anesthesia Plan Comments:         Anesthesia Quick Evaluation

## 2017-12-17 ENCOUNTER — Ambulatory Visit (HOSPITAL_COMMUNITY): Admission: RE | Admit: 2017-12-17 | Payer: Worker's Compensation | Source: Ambulatory Visit

## 2017-12-17 ENCOUNTER — Encounter (HOSPITAL_COMMUNITY): Payer: Self-pay

## 2017-12-17 ENCOUNTER — Ambulatory Visit (HOSPITAL_COMMUNITY): Payer: Self-pay | Admitting: Anesthesiology

## 2017-12-17 ENCOUNTER — Ambulatory Visit (HOSPITAL_COMMUNITY)
Admission: RE | Admit: 2017-12-17 | Discharge: 2017-12-17 | Disposition: A | Discharge status: Home / Self Care | Payer: Worker's Compensation | Source: Ambulatory Visit | Attending: Emergency Medicine | Admitting: Emergency Medicine

## 2017-12-17 ENCOUNTER — Other Ambulatory Visit: Payer: Self-pay

## 2017-12-17 ENCOUNTER — Ambulatory Visit (HOSPITAL_COMMUNITY)
Admission: RE | Admit: 2017-12-17 | Discharge: 2017-12-17 | Disposition: A | Discharge status: Home / Self Care | Payer: Self-pay | Source: Ambulatory Visit | Attending: Emergency Medicine | Admitting: Emergency Medicine

## 2017-12-17 ENCOUNTER — Encounter (HOSPITAL_COMMUNITY)
Admission: RE | Disposition: A | Discharge status: Home / Self Care | Payer: Self-pay | Source: Ambulatory Visit | Attending: Emergency Medicine

## 2017-12-17 DIAGNOSIS — M50122 Cervical disc disorder at C5-C6 level with radiculopathy: Secondary | ICD-10-CM | POA: Diagnosis not present

## 2017-12-17 DIAGNOSIS — M4802 Spinal stenosis, cervical region: Secondary | ICD-10-CM | POA: Diagnosis not present

## 2017-12-17 DIAGNOSIS — S40012A Contusion of left shoulder, initial encounter: Secondary | ICD-10-CM | POA: Insufficient documentation

## 2017-12-17 DIAGNOSIS — S139XXA Sprain of joints and ligaments of unspecified parts of neck, initial encounter: Secondary | ICD-10-CM | POA: Insufficient documentation

## 2017-12-17 DIAGNOSIS — X58XXXA Exposure to other specified factors, initial encounter: Secondary | ICD-10-CM | POA: Insufficient documentation

## 2017-12-17 DIAGNOSIS — M4722 Other spondylosis with radiculopathy, cervical region: Secondary | ICD-10-CM | POA: Diagnosis not present

## 2017-12-17 DIAGNOSIS — F0781 Postconcussional syndrome: Secondary | ICD-10-CM

## 2017-12-17 DIAGNOSIS — S39012A Strain of muscle, fascia and tendon of lower back, initial encounter: Secondary | ICD-10-CM | POA: Insufficient documentation

## 2017-12-17 DIAGNOSIS — M542 Cervicalgia: Secondary | ICD-10-CM

## 2017-12-17 DIAGNOSIS — G44329 Chronic post-traumatic headache, not intractable: Secondary | ICD-10-CM

## 2017-12-17 HISTORY — PX: RADIOLOGY WITH ANESTHESIA: SHX6223

## 2017-12-17 LAB — CBC
HEMATOCRIT: 43.8 % (ref 36.0–46.0)
HEMOGLOBIN: 13.2 g/dL (ref 12.0–15.0)
MCH: 25.5 pg — ABNORMAL LOW (ref 26.0–34.0)
MCHC: 30.1 g/dL (ref 30.0–36.0)
MCV: 84.6 fL (ref 80.0–100.0)
NRBC: 0 % (ref 0.0–0.2)
Platelets: 426 10*3/uL — ABNORMAL HIGH (ref 150–400)
RBC: 5.18 MIL/uL — AB (ref 3.87–5.11)
RDW: 13.5 % (ref 11.5–15.5)
WBC: 8 10*3/uL (ref 4.0–10.5)

## 2017-12-17 LAB — BASIC METABOLIC PANEL
ANION GAP: 9 (ref 5–15)
BUN: 9 mg/dL (ref 6–20)
CHLORIDE: 104 mmol/L (ref 98–111)
CO2: 25 mmol/L (ref 22–32)
CREATININE: 1.2 mg/dL — AB (ref 0.44–1.00)
Calcium: 9.1 mg/dL (ref 8.9–10.3)
GFR calc non Af Amer: 53 mL/min — ABNORMAL LOW (ref >60)
Glucose, Bld: 107 mg/dL — ABNORMAL HIGH (ref 70–99)
POTASSIUM: 4.1 mmol/L (ref 3.5–5.1)
SODIUM: 138 mmol/L (ref 135–145)

## 2017-12-17 SURGERY — MRI WITH ANESTHESIA
Anesthesia: General

## 2017-12-17 NOTE — Transfer of Care (Signed)
Immediate Anesthesia Transfer of Care Note  Patient: Tracy Rose  Procedure(s) Performed: MRI OF BRAIN AND CERVICAL SPINE WITHOUT CONTRAST (N/A )  Patient Location: PACU  Anesthesia Type:General  Level of Consciousness: awake, alert  and oriented  Airway & Oxygen Therapy: Patient Spontanous Breathing and Patient connected to nasal cannula oxygen  Post-op Assessment: Report given to RN, Post -op Vital signs reviewed and stable and Patient moving all extremities  Post vital signs: Reviewed and stable  Last Vitals:  Vitals Value Taken Time  BP 140/100 12/17/2017  9:53 AM  Temp    Pulse 82 12/17/2017  9:58 AM  Resp 17 12/17/2017  9:58 AM  SpO2 97 % 12/17/2017  9:58 AM  Vitals shown include unvalidated device data.  Last Pain:  Vitals:   12/17/17 0655  TempSrc: Oral         Complications: No apparent anesthesia complications

## 2017-12-18 ENCOUNTER — Encounter (HOSPITAL_COMMUNITY): Payer: Self-pay | Admitting: Radiology

## 2017-12-18 MED FILL — Ondansetron HCl Inj 4 MG/2ML (2 MG/ML): INTRAMUSCULAR | Qty: 2 | Status: AC

## 2017-12-18 MED FILL — Phenylephrine-NaCl Pref Syr 0.4 MG/10ML-0.9% (40 MCG/ML): INTRAVENOUS | Qty: 10 | Status: AC

## 2017-12-18 MED FILL — Dexamethasone Sod Phosphate Preservative Free Inj 10 MG/ML: INTRAMUSCULAR | Qty: 1 | Status: AC

## 2017-12-18 MED FILL — Lidocaine HCl(Cardiac) IV PF Soln Pref Syr 100 MG/5ML (2%): INTRAVENOUS | Qty: 5 | Status: AC

## 2017-12-18 MED FILL — Midazolam HCl Inj 2 MG/2ML (Base Equivalent): INTRAMUSCULAR | Qty: 2 | Status: AC

## 2017-12-18 MED FILL — Sugammadex Sodium IV 200 MG/2ML (Base Equivalent): INTRAVENOUS | Qty: 4 | Status: AC

## 2017-12-18 MED FILL — Propofol IV Emul 200 MG/20ML (10 MG/ML): INTRAVENOUS | Qty: 20 | Status: AC

## 2017-12-18 MED FILL — Fentanyl Citrate Preservative Free (PF) Inj 100 MCG/2ML: INTRAMUSCULAR | Qty: 2 | Status: AC

## 2017-12-18 MED FILL — Rocuronium Bromide IV Soln 50 MG/5ML (10 MG/ML): INTRAVENOUS | Qty: 5 | Status: AC

## 2017-12-20 ENCOUNTER — Other Ambulatory Visit: Payer: Self-pay | Admitting: Family Medicine

## 2018-01-13 ENCOUNTER — Other Ambulatory Visit: Payer: Self-pay | Admitting: Orthopedic Surgery

## 2018-01-13 ENCOUNTER — Other Ambulatory Visit (HOSPITAL_COMMUNITY): Payer: Self-pay | Admitting: Orthopedic Surgery

## 2018-01-13 DIAGNOSIS — M545 Low back pain, unspecified: Secondary | ICD-10-CM

## 2018-02-03 ENCOUNTER — Other Ambulatory Visit: Payer: Self-pay

## 2018-02-03 ENCOUNTER — Encounter (HOSPITAL_COMMUNITY): Payer: Self-pay | Admitting: Anesthesiology

## 2018-02-03 ENCOUNTER — Encounter (HOSPITAL_COMMUNITY): Payer: Self-pay | Admitting: *Deleted

## 2018-02-03 NOTE — Progress Notes (Signed)
Numerous calls place to Tracy Rose, voice mail full.  I called her mother's number x 2 - not in service.

## 2018-02-03 NOTE — Anesthesia Preprocedure Evaluation (Deleted)
Anesthesia Evaluation  Patient identified by MRN, date of birth, ID band Patient awake    Reviewed: Allergy & Precautions, NPO status , Patient's Chart, lab work & pertinent test results  Airway Mallampati: III  TM Distance: >3 FB Neck ROM: Full    Dental no notable dental hx. (+) Missing, Dental Advisory Given,    Pulmonary neg pulmonary ROS, former smoker,    Pulmonary exam normal breath sounds clear to auscultation       Cardiovascular hypertension, Pt. on medications negative cardio ROS Normal cardiovascular exam Rhythm:Regular Rate:Normal     Neuro/Psych  Headaches, PSYCHIATRIC DISORDERS Anxiety Depression Bipolar Disorder    GI/Hepatic GERD  Medicated,(+)     substance abuse  alcohol use,   Endo/Other  Morbid obesity  Renal/GU negative Renal ROS  negative genitourinary   Musculoskeletal negative musculoskeletal ROS (+)   Abdominal   Peds  Hematology  (+) Blood dyscrasia, anemia ,   Anesthesia Other Findings Headaches, head trauma 09/2017  Reproductive/Obstetrics                             Anesthesia Physical  Anesthesia Plan  ASA: III  Anesthesia Plan: General   Post-op Pain Management:    Induction: Intravenous  PONV Risk Score and Plan: 3 and Midazolam, Dexamethasone and Ondansetron  Airway Management Planned: Oral ETT and LMA  Additional Equipment:   Intra-op Plan:   Post-operative Plan: Extubation in OR  Informed Consent: I have reviewed the patients History and Physical, chart, labs and discussed the procedure including the risks, benefits and alternatives for the proposed anesthesia with the patient or authorized representative who has indicated his/her understanding and acceptance.   Dental advisory given  Plan Discussed with: CRNA, Anesthesiologist and Surgeon  Anesthesia Plan Comments: ( )        Anesthesia Quick Evaluation

## 2018-02-04 ENCOUNTER — Ambulatory Visit: Admit: 2018-02-04 | Payer: Worker's Compensation

## 2018-02-04 ENCOUNTER — Encounter (HOSPITAL_COMMUNITY): Payer: Self-pay

## 2018-02-04 ENCOUNTER — Ambulatory Visit (HOSPITAL_COMMUNITY): Admission: RE | Admit: 2018-02-04 | Payer: Worker's Compensation | Source: Ambulatory Visit

## 2018-02-04 HISTORY — DX: Post-traumatic stress disorder, unspecified: F43.10

## 2018-02-04 HISTORY — DX: Concussion with loss of consciousness of unspecified duration, initial encounter: S06.0X9A

## 2018-02-04 SURGERY — MRI WITH ANESTHESIA
Anesthesia: General

## 2018-02-04 NOTE — Progress Notes (Signed)
Patient called and stated that she will not be able to come today due to transportation not picking her up.  She stated that transportation had be set up by workman's comp and this is the second time they have not arrived.

## 2018-02-12 ENCOUNTER — Other Ambulatory Visit (HOSPITAL_COMMUNITY): Payer: Self-pay | Admitting: Orthopedic Surgery

## 2018-02-12 DIAGNOSIS — M545 Low back pain, unspecified: Secondary | ICD-10-CM

## 2018-02-17 ENCOUNTER — Encounter (HOSPITAL_COMMUNITY): Payer: Self-pay | Admitting: Anesthesiology

## 2018-02-17 ENCOUNTER — Encounter (HOSPITAL_COMMUNITY): Payer: Self-pay | Admitting: *Deleted

## 2018-02-17 ENCOUNTER — Other Ambulatory Visit: Payer: Self-pay

## 2018-02-17 NOTE — Anesthesia Preprocedure Evaluation (Deleted)
Anesthesia Evaluation  Patient identified by MRN, date of birth, ID band Patient awake    Reviewed: Allergy & Precautions, NPO status , Patient's Chart, lab work & pertinent test results  Airway Mallampati: III  TM Distance: >3 FB Neck ROM: Full    Dental no notable dental hx. (+) Missing, Dental Advisory Given,    Pulmonary neg pulmonary ROS, Current Smoker, former smoker,    Pulmonary exam normal breath sounds clear to auscultation       Cardiovascular hypertension, negative cardio ROS Normal cardiovascular exam Rhythm:Regular Rate:Normal     Neuro/Psych  Headaches, PSYCHIATRIC DISORDERS Anxiety Depression Bipolar Disorder    GI/Hepatic Neg liver ROS, GERD  Medicated,  Endo/Other  Morbid obesity  Renal/GU negative Renal ROS  negative genitourinary   Musculoskeletal negative musculoskeletal ROS (+)   Abdominal   Peds  Hematology  (+) Blood dyscrasia, anemia ,   Anesthesia Other Findings Headaches, head trauma 09/2017  Reproductive/Obstetrics                             Anesthesia Physical  Anesthesia Plan  ASA: III  Anesthesia Plan: General   Post-op Pain Management:    Induction: Intravenous  PONV Risk Score and Plan: 3 and Midazolam, Dexamethasone and Ondansetron  Airway Management Planned: Oral ETT and LMA  Additional Equipment:   Intra-op Plan:   Post-operative Plan: Extubation in OR  Informed Consent: I have reviewed the patients History and Physical, chart, labs and discussed the procedure including the risks, benefits and alternatives for the proposed anesthesia with the patient or authorized representative who has indicated his/her understanding and acceptance.     Dental advisory given  Plan Discussed with: CRNA, Anesthesiologist and Surgeon  Anesthesia Plan Comments:         Anesthesia Quick Evaluation

## 2018-02-18 ENCOUNTER — Encounter (HOSPITAL_COMMUNITY): Payer: Self-pay | Admitting: Certified Registered Nurse Anesthetist

## 2018-02-18 ENCOUNTER — Ambulatory Visit: Admission: RE | Admit: 2018-02-18 | Payer: Worker's Compensation | Source: Ambulatory Visit

## 2018-02-18 ENCOUNTER — Encounter: Admission: RE | Payer: Self-pay | Source: Ambulatory Visit

## 2018-02-18 ENCOUNTER — Ambulatory Visit (HOSPITAL_COMMUNITY)
Admission: RE | Admit: 2018-02-18 | Discharge: 2018-02-18 | Disposition: A | Discharge status: Home / Self Care | Payer: Worker's Compensation | Source: Ambulatory Visit | Attending: Orthopedic Surgery | Admitting: Orthopedic Surgery

## 2018-02-18 DIAGNOSIS — M545 Low back pain, unspecified: Secondary | ICD-10-CM

## 2018-02-18 SURGERY — MRI WITH ANESTHESIA
Anesthesia: General

## 2018-02-18 NOTE — Progress Notes (Signed)
MRI cancelled by Dr. Ambrose Pancoast due to patient having full meal at 0400.  Research scientist (physical sciences), Elmo Putt, and Debbie in MRI aware.

## 2018-02-18 NOTE — Progress Notes (Signed)
Patient arrived to admitting and reported that at 0400 she ate fried chicken, green beans, and corn bread.  She informed nurse that she finished eating at 0500.  Dr. Ermalene Postin, Dr. Ambrose Pancoast, CRNA charge, and CRNA aware.  Lynette in radiology aware.  Patient has has gone home and is awaiting a call about schedule.

## 2018-04-10 NOTE — Progress Notes (Signed)
I spoke with Jeneen Rinks MA at Dr. Laurena Bering office and informed him that patient's H/P is out of date, that we need one that is within 30 days of MRI.

## 2018-04-11 ENCOUNTER — Other Ambulatory Visit: Payer: Self-pay

## 2018-04-11 ENCOUNTER — Encounter (HOSPITAL_COMMUNITY): Payer: Self-pay | Admitting: *Deleted

## 2018-04-11 NOTE — Progress Notes (Addendum)
Ms Sowder denies chest pain or shortness of breath.  Patient reports that she has a ride Tuesday and that she will not eat or drink after midnight.  Ms Siler reports that her father, Bertram Millard will be with her Tuesday. Patient  states that neither she or her father have experienced any of the following. Cough Fever ( >100.4) Runny nose Sore Throat Difficulty breathing/shortness of breath Travel in past  2 weeks  Been around anyone who has been tested or diagnosed with Corona Virus.  Ms Alleyne states that she saw Dr. Lynann Bologna within the last month.  We are awaiting the H/P.

## 2018-04-13 ENCOUNTER — Encounter (HOSPITAL_COMMUNITY): Payer: Self-pay | Admitting: Anesthesiology

## 2018-04-15 ENCOUNTER — Ambulatory Visit: Admit: 2018-04-15 | Payer: Self-pay

## 2018-04-15 ENCOUNTER — Ambulatory Visit (HOSPITAL_COMMUNITY): Admission: RE | Admit: 2018-04-15 | Payer: Self-pay | Source: Ambulatory Visit

## 2018-04-15 SURGERY — MRI WITH ANESTHESIA
Anesthesia: General

## 2018-11-18 ENCOUNTER — Ambulatory Visit (INDEPENDENT_AMBULATORY_CARE_PROVIDER_SITE_OTHER): Payer: Medicaid Other | Admitting: Family Medicine

## 2018-11-18 ENCOUNTER — Encounter: Payer: Self-pay | Admitting: Family Medicine

## 2018-11-18 ENCOUNTER — Other Ambulatory Visit: Payer: Self-pay

## 2018-11-18 VITALS — BP 118/82 | HR 85 | Ht 66.0 in | Wt 243.0 lb

## 2018-11-18 DIAGNOSIS — Z0000 Encounter for general adult medical examination without abnormal findings: Secondary | ICD-10-CM | POA: Diagnosis not present

## 2018-11-18 DIAGNOSIS — N62 Hypertrophy of breast: Secondary | ICD-10-CM

## 2018-11-18 DIAGNOSIS — Z1231 Encounter for screening mammogram for malignant neoplasm of breast: Secondary | ICD-10-CM | POA: Diagnosis not present

## 2018-11-18 DIAGNOSIS — F333 Major depressive disorder, recurrent, severe with psychotic symptoms: Secondary | ICD-10-CM

## 2018-11-18 DIAGNOSIS — I10 Essential (primary) hypertension: Secondary | ICD-10-CM

## 2018-11-18 DIAGNOSIS — Z1211 Encounter for screening for malignant neoplasm of colon: Secondary | ICD-10-CM

## 2018-11-18 DIAGNOSIS — F312 Bipolar disorder, current episode manic severe with psychotic features: Secondary | ICD-10-CM

## 2018-11-18 NOTE — Assessment & Plan Note (Signed)
Diet and exercise counseling done. She is doing great with weight loss and she is motivated. I will continue to support her.

## 2018-11-18 NOTE — Assessment & Plan Note (Signed)
Compliant with meds and Psych f/u. She got a bit worked up this morning after having an arguement with her daughter, but is doing well for the most part. I have encouraged good f/u with psych and compliance with her meds. Psych will continue to manage symptoms. She verbalized understanding and agreed with the plan.

## 2018-11-18 NOTE — Patient Instructions (Signed)

## 2018-11-18 NOTE — Progress Notes (Addendum)
Subjective:     Tracy Rose is a 53 y.o. female and is here for a comprehensive physical exam. The patient reports no problems. Per the patient, she is compliant with her Bipolar meds and she is compliant with Psych f/u. She has been having behavioral issues with her daughter which she feels she is handling well, although she gets on her nerves. Still having neck and shoulder pain from her breast weight.  Social History   Socioeconomic History  . Marital status: Divorced    Spouse name: Not on file  . Number of children: 1  . Years of education: Assoc.  . Highest education level: Not on file  Occupational History  . Occupation: Recruitment consultant: [employer]  Social Needs  . Financial resource strain: Not on file  . Food insecurity    Worry: Not on file    Inability: Not on file  . Transportation needs    Medical: Not on file    Non-medical: Not on file  Tobacco Use  . Smoking status: Current Every Day Smoker    Packs/day: 0.13    Years: 25.00    Pack years: 3.25    Types: Cigarettes    Last attempt to quit: 10/08/2017    Years since quitting: 1.1  . Smokeless tobacco: Never Used  Substance and Sexual Activity  . Alcohol use: No    Alcohol/week: 0.0 standard drinks    Comment: Sober since 06/03/1992  . Drug use: No  . Sexual activity: Not Currently    Birth control/protection: Surgical  Lifestyle  . Physical activity    Days per week: Not on file    Minutes per session: Not on file  . Stress: Not on file  Relationships  . Social Herbalist on phone: Not on file    Gets together: Not on file    Attends religious service: Not on file    Active member of club or organization: Not on file    Attends meetings of clubs or organizations: Not on file    Relationship status: Not on file  . Intimate partner violence    Fear of current or ex partner: Not on file    Emotionally abused: Not on file    Physically abused: Not on file   Forced sexual activity: Not on file  Other Topics Concern  . Not on file  Social History Narrative   Unemployed.  Associates Degree.  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   Caffeine Use: none   Health Maintenance  Topic Date Due  . COLONOSCOPY  11/14/2017  . MAMMOGRAM  12/17/2017  . INFLUENZA VACCINE  08/23/2018  . PAP SMEAR-Modifier  08/18/2019  . TETANUS/TDAP  07/13/2022  . HIV Screening  Completed    The following portions of the patient's history were reviewed and updated as appropriate: allergies, current medications, past family history, past medical history, past social history, past surgical history and problem list.  Review of Systems Pertinent items noted in HPI and remainder of comprehensive ROS otherwise negative.   Objective:    BP 118/82   Pulse 85   Ht 5\' 6"  (1.676 m)   Wt 243 lb (110.2 kg)   LMP  (LMP Unknown)   SpO2 98%   BMI 39.22 kg/m  General appearance: alert and cooperative Head: Normocephalic, without obvious abnormality, atraumatic Eyes: conjunctivae/corneas clear. PERRL, EOM's intact. Fundi benign. Throat: lips, mucosa, and  tongue normal; teeth and gums normal Neck: no adenopathy, no carotid bruit, no JVD, supple, symmetrical, trachea midline and thyroid not enlarged, symmetric, no tenderness/mass/nodules Lungs: clear to auscultation bilaterally Heart: regular rate and rhythm, S1, S2 normal, no murmur, click, rub or gallop Abdomen: soft, non-tender; bowel sounds normal; no masses,  no organomegaly Extremities: extremities normal, atraumatic, no cyanosis or edema Pulses: 2+ and symmetric Neurologic: Alert and oriented X 3, normal strength and tone. Normal symmetric reflexes. Normal coordination and gait      Office Visit from 11/18/2018 in [facility]  PHQ-9 Total Score  0        Assessment:    Healthy female exam. Bipolar/Depression Obesity  Macromastia Essential HTN Plan:    Normal exam. Declined flu shot. Diet and exercise for weight loss discussed. She will schedule mammogram. Slip given and order placed. She agreed with GI referral for colonoscopy. Labs checked today (FLP/Bmet) I will contact her with the result. She declined flu shot.

## 2018-11-18 NOTE — Assessment & Plan Note (Signed)
Stable with good f/u with Psych per patient. No suicidal or homicidal ideation.

## 2018-11-19 ENCOUNTER — Telehealth: Payer: Self-pay | Admitting: Family Medicine

## 2018-11-19 LAB — BASIC METABOLIC PANEL
BUN/Creatinine Ratio: 8 — ABNORMAL LOW (ref 9–23)
BUN: 8 mg/dL (ref 6–24)
CO2: 18 mmol/L — ABNORMAL LOW (ref 20–29)
Calcium: 10.1 mg/dL (ref 8.7–10.2)
Chloride: 107 mmol/L — ABNORMAL HIGH (ref 96–106)
Creatinine, Ser: 0.98 mg/dL (ref 0.57–1.00)
GFR calc Af Amer: 77 mL/min/{1.73_m2} (ref >59)
GFR calc non Af Amer: 67 mL/min/{1.73_m2} (ref >59)
Glucose: 91 mg/dL (ref 65–99)
Potassium: 4.4 mmol/L (ref 3.5–5.2)
Sodium: 140 mmol/L (ref 134–144)

## 2018-11-19 LAB — LIPID PANEL
Chol/HDL Ratio: 4.2 ratio (ref 0.0–4.4)
Cholesterol, Total: 200 mg/dL — ABNORMAL HIGH (ref 100–199)
HDL: 48 mg/dL (ref >39)
LDL Chol Calc (NIH): 137 mg/dL — ABNORMAL HIGH (ref 0–99)
Triglycerides: 85 mg/dL (ref 0–149)
VLDL Cholesterol Cal: 15 mg/dL (ref 5–40)

## 2018-11-19 NOTE — Assessment & Plan Note (Signed)
Still a considerable problem with back and neck pain. She will like to discuss breast reduction with surgery. This will be a medically appropriate procedure due to her MSK pain.

## 2018-11-19 NOTE — Assessment & Plan Note (Signed)
BP looks good on current regimen. 

## 2018-11-19 NOTE — Telephone Encounter (Signed)
I discussed cholesterol result with the patient.  She has a borderline risk of 6.3% for 10-years ASCVD Risk. I discussed moderate intensity statin vs lifestyle modification. She prefers lifestyle modification and repeat lab in 6-12 months.  All questions were answered.

## 2018-11-19 NOTE — Addendum Note (Signed)
Addended by: Andrena Mews T on: 11/19/2018 11:22 AM   Modules accepted: Orders

## 2018-11-26 ENCOUNTER — Telehealth: Payer: Self-pay | Admitting: Family Medicine

## 2018-11-26 NOTE — Telephone Encounter (Signed)
Pt would like a referral to have her mammogram done.

## 2018-11-26 NOTE — Telephone Encounter (Signed)
Informed patient that order for mammogram and referral for colonoscopy were placed at her last appt on 11/18/2018 with Dr. Gwendlyn Deutscher.  She voiced understanding and will call the breast center to schedule her mammogram.  Johnney Ou

## 2019-01-05 ENCOUNTER — Ambulatory Visit: Payer: Medicaid Other | Admitting: Family Medicine

## 2019-02-02 ENCOUNTER — Ambulatory Visit: Payer: Medicaid Other

## 2019-02-23 ENCOUNTER — Encounter: Payer: Self-pay | Admitting: Family Medicine

## 2019-02-24 ENCOUNTER — Ambulatory Visit: Payer: Medicaid Other | Admitting: Family Medicine

## 2019-02-25 ENCOUNTER — Telehealth: Payer: Self-pay | Admitting: Family Medicine

## 2019-02-25 NOTE — Telephone Encounter (Signed)
I called to discuss her missed appointment. She said she forgot she has an appointment. I discussed the No-show policy with her as an indication for dismissal in the future.  She is advised to cancel her appointments more than 24 hours in advance to avoid her missed appointment counting as a No-show.   She verbalized understanding and agreed with the plan.   She will receive a letter if this happens again and then the third time will lead to dismissal from our practice.

## 2019-04-22 ENCOUNTER — Telehealth: Payer: Self-pay | Admitting: *Deleted

## 2019-04-22 ENCOUNTER — Other Ambulatory Visit: Payer: Self-pay | Admitting: Family Medicine

## 2019-04-22 DIAGNOSIS — M5416 Radiculopathy, lumbar region: Secondary | ICD-10-CM

## 2019-04-22 MED ORDER — HYDROCHLOROTHIAZIDE 12.5 MG PO TABS: 25.0000 mg | ORAL_TABLET | Freq: Every day | ORAL | 1 refills | Status: DC

## 2019-04-22 MED ORDER — AMLODIPINE BESYLATE 10 MG PO TABS: 10.0000 mg | ORAL_TABLET | Freq: Every day | ORAL | 1 refills | Status: DC

## 2019-04-22 MED ORDER — HYDROCHLOROTHIAZIDE 12.5 MG PO TABS: 12.5000 mg | ORAL_TABLET | Freq: Every day | ORAL | 1 refills | Status: DC

## 2019-04-22 NOTE — Telephone Encounter (Signed)
I called the pharmacy to clarify her HCTZ which should be 12.5 mg QD.  I spoke with Juliann Pulse who confirmed with me that the initial prescription for 25 mg qd has been taken off.

## 2019-04-22 NOTE — Telephone Encounter (Signed)
Pharmacy calls nurse line requesting a new sig for hydrochlorothiazide. Per pharmacist, there appears to be two different sigs. Please advise.

## 2019-04-22 NOTE — Telephone Encounter (Signed)
Adjusted.

## 2019-04-29 ENCOUNTER — Other Ambulatory Visit: Payer: Self-pay

## 2019-04-29 ENCOUNTER — Telehealth: Payer: Self-pay

## 2019-04-29 ENCOUNTER — Emergency Department (HOSPITAL_COMMUNITY)
Admission: EM | Admit: 2019-04-29 | Discharge: 2019-04-30 | Disposition: A | Discharge status: Home / Self Care | Payer: Medicaid Other | Attending: Emergency Medicine | Admitting: Emergency Medicine

## 2019-04-29 ENCOUNTER — Encounter (HOSPITAL_COMMUNITY): Payer: Self-pay | Admitting: Pediatrics

## 2019-04-29 ENCOUNTER — Emergency Department (HOSPITAL_COMMUNITY): Payer: Medicaid Other

## 2019-04-29 DIAGNOSIS — R1310 Dysphagia, unspecified: Secondary | ICD-10-CM | POA: Insufficient documentation

## 2019-04-29 DIAGNOSIS — L03221 Cellulitis of neck: Secondary | ICD-10-CM | POA: Insufficient documentation

## 2019-04-29 DIAGNOSIS — F17210 Nicotine dependence, cigarettes, uncomplicated: Secondary | ICD-10-CM | POA: Insufficient documentation

## 2019-04-29 DIAGNOSIS — I10 Essential (primary) hypertension: Secondary | ICD-10-CM | POA: Diagnosis not present

## 2019-04-29 DIAGNOSIS — R21 Rash and other nonspecific skin eruption: Secondary | ICD-10-CM | POA: Diagnosis present

## 2019-04-29 DIAGNOSIS — F4310 Post-traumatic stress disorder, unspecified: Secondary | ICD-10-CM | POA: Diagnosis not present

## 2019-04-29 DIAGNOSIS — L299 Pruritus, unspecified: Secondary | ICD-10-CM | POA: Diagnosis not present

## 2019-04-29 DIAGNOSIS — Z79899 Other long term (current) drug therapy: Secondary | ICD-10-CM | POA: Insufficient documentation

## 2019-04-29 LAB — COMPREHENSIVE METABOLIC PANEL
ALT: 11 U/L (ref 0–44)
AST: 12 U/L — ABNORMAL LOW (ref 15–41)
Albumin: 3.5 g/dL (ref 3.5–5.0)
Alkaline Phosphatase: 70 U/L (ref 38–126)
Anion gap: 10 (ref 5–15)
BUN: 8 mg/dL (ref 6–20)
CO2: 21 mmol/L — ABNORMAL LOW (ref 22–32)
Calcium: 8.8 mg/dL — ABNORMAL LOW (ref 8.9–10.3)
Chloride: 107 mmol/L (ref 98–111)
Creatinine, Ser: 1.07 mg/dL — ABNORMAL HIGH (ref 0.44–1.00)
GFR calc Af Amer: >60 mL/min (ref >60)
GFR calc non Af Amer: >60 mL/min (ref >60)
Glucose, Bld: 110 mg/dL — ABNORMAL HIGH (ref 70–99)
Potassium: 3.8 mmol/L (ref 3.5–5.1)
Sodium: 138 mmol/L (ref 135–145)
Total Bilirubin: 0.2 mg/dL — ABNORMAL LOW (ref 0.3–1.2)
Total Protein: 6.7 g/dL (ref 6.5–8.1)

## 2019-04-29 LAB — CBC WITH DIFFERENTIAL/PLATELET
Abs Immature Granulocytes: 0.02 10*3/uL (ref 0.00–0.07)
Basophils Absolute: 0 10*3/uL (ref 0.0–0.1)
Basophils Relative: 1 %
Eosinophils Absolute: 0.3 10*3/uL (ref 0.0–0.5)
Eosinophils Relative: 4 %
HCT: 45.4 % (ref 36.0–46.0)
Hemoglobin: 14.1 g/dL (ref 12.0–15.0)
Immature Granulocytes: 0 %
Lymphocytes Relative: 42 %
Lymphs Abs: 3.1 10*3/uL (ref 0.7–4.0)
MCH: 26.6 pg (ref 26.0–34.0)
MCHC: 31.1 g/dL (ref 30.0–36.0)
MCV: 85.5 fL (ref 80.0–100.0)
Monocytes Absolute: 0.4 10*3/uL (ref 0.1–1.0)
Monocytes Relative: 6 %
Neutro Abs: 3.4 10*3/uL (ref 1.7–7.7)
Neutrophils Relative %: 47 %
Platelets: 336 10*3/uL (ref 150–400)
RBC: 5.31 MIL/uL — ABNORMAL HIGH (ref 3.87–5.11)
RDW: 14.4 % (ref 11.5–15.5)
WBC: 7.3 10*3/uL (ref 4.0–10.5)
nRBC: 0 % (ref 0.0–0.2)

## 2019-04-29 NOTE — ED Triage Notes (Signed)
Patient concern for allergic reaction from Lamotrigine. Stated she's been taking it for years and twice before she'd had some redness. Patient reported the rashes this time are all over. Patient c/o hurts to swallow and painful urination.

## 2019-04-29 NOTE — Telephone Encounter (Signed)
Spoke with patient again, and is agreeable to being evaluated in ED, as she is beginning to feel worse. Patient states that she will be heading to the ED this evening.   Tracy Rose Tracy Rose c

## 2019-04-29 NOTE — Telephone Encounter (Signed)
ED visit today recommended. If she is unable to go to the ED may get on Virtual appointment ASAP

## 2019-04-29 NOTE — Telephone Encounter (Signed)
Patient calls nurse line reporting hives, onset Saturday evening. Patient states they are all over body (face, arms, back, thighs). Patient also reports fatigue, cold chills, and nausea. Patient declines new medications, food or laundry detergent.   Patient is concerned she may be having reaction to [place] with PCP, who advised patient be evaluated virtually and to inform prescribing doctor of issue. Patient scheduled virtually for tomorrow AM.   Strict ED precautions given.   To PCP  Talbot Grumbling, RN

## 2019-04-30 ENCOUNTER — Emergency Department (HOSPITAL_COMMUNITY): Payer: Medicaid Other

## 2019-04-30 ENCOUNTER — Telehealth (INDEPENDENT_AMBULATORY_CARE_PROVIDER_SITE_OTHER): Payer: Medicaid Other | Admitting: Family Medicine

## 2019-04-30 ENCOUNTER — Telehealth: Payer: Self-pay | Admitting: Family Medicine

## 2019-04-30 DIAGNOSIS — G8929 Other chronic pain: Secondary | ICD-10-CM

## 2019-04-30 DIAGNOSIS — F319 Bipolar disorder, unspecified: Secondary | ICD-10-CM

## 2019-04-30 DIAGNOSIS — L03818 Cellulitis of other sites: Secondary | ICD-10-CM

## 2019-04-30 DIAGNOSIS — R911 Solitary pulmonary nodule: Secondary | ICD-10-CM | POA: Insufficient documentation

## 2019-04-30 DIAGNOSIS — E041 Nontoxic single thyroid nodule: Secondary | ICD-10-CM | POA: Insufficient documentation

## 2019-04-30 DIAGNOSIS — R21 Rash and other nonspecific skin eruption: Secondary | ICD-10-CM | POA: Diagnosis not present

## 2019-04-30 DIAGNOSIS — L0390 Cellulitis, unspecified: Secondary | ICD-10-CM | POA: Insufficient documentation

## 2019-04-30 DIAGNOSIS — M545 Low back pain, unspecified: Secondary | ICD-10-CM

## 2019-04-30 DIAGNOSIS — F329 Major depressive disorder, single episode, unspecified: Secondary | ICD-10-CM

## 2019-04-30 MED ORDER — CLINDAMYCIN PHOSPHATE 600 MG/50ML IV SOLN
600.0000 mg | Freq: Once | INTRAVENOUS | Status: AC
  Administered 2019-04-30: 06:00:00 600 mg via INTRAVENOUS
  Filled 2019-04-30: qty 50

## 2019-04-30 MED ORDER — IOHEXOL 300 MG/ML  SOLN
75.0000 mL | Freq: Once | INTRAMUSCULAR | Status: AC | PRN
  Administered 2019-04-30: 75 mL via INTRAVENOUS

## 2019-04-30 MED ORDER — METHYLPREDNISOLONE SODIUM SUCC 125 MG IJ SOLR
125.0000 mg | Freq: Once | INTRAMUSCULAR | Status: AC
  Administered 2019-04-30: 03:00:00 125 mg via INTRAVENOUS
  Filled 2019-04-30: qty 2

## 2019-04-30 MED ORDER — FAMOTIDINE IN NACL 20-0.9 MG/50ML-% IV SOLN
20.0000 mg | INTRAVENOUS | Status: AC
  Administered 2019-04-30: 03:00:00 20 mg via INTRAVENOUS
  Filled 2019-04-30: qty 50

## 2019-04-30 MED ORDER — DIPHENHYDRAMINE HCL 50 MG/ML IJ SOLN
25.0000 mg | Freq: Once | INTRAMUSCULAR | Status: AC
  Administered 2019-04-30: 25 mg via INTRAVENOUS
  Filled 2019-04-30: qty 1

## 2019-04-30 MED ORDER — CLINDAMYCIN HCL 150 MG PO CAPS: 300.0000 mg | ORAL_CAPSULE | Freq: Three times a day (TID) | ORAL | 0 refills | Status: DC

## 2019-04-30 MED ORDER — DIPHENHYDRAMINE HCL 25 MG PO CAPS: 25.0000 mg | ORAL_CAPSULE | Freq: Four times a day (QID) | ORAL | 0 refills | Status: DC | PRN

## 2019-04-30 MED ORDER — SODIUM CHLORIDE 0.9 % IV BOLUS
1000.0000 mL | Freq: Once | INTRAVENOUS | Status: AC
  Administered 2019-04-30: 03:00:00 1000 mL via INTRAVENOUS

## 2019-04-30 NOTE — ED Notes (Signed)
Pt aware of need for urine specimen. 

## 2019-04-30 NOTE — Assessment & Plan Note (Signed)
Most likely urticarial in nature, possibly due to drug reaction.  Patient feeling much better now after receiving IV fluids, Benadryl and Solu-Medrol. -Continue taking medications as prescribed -Patient instructed to call or seek emergency care should symptoms return

## 2019-04-30 NOTE — Assessment & Plan Note (Signed)
Patient with reports of submental swelling with associated external pressure and difficulty swallowing.  No breathing compromise.  CT scan in the emergency department suggestive of acute infection/cellulitis.  Cannot exclude angioedema.  Patient treated with IV fluids, Solu-Medrol, Benadryl, and Pepcid for concerns of angioedema versus other allergic reaction.  Could consider Ludwig's angina as possible cause. -Clindamycin 300 mg p.o. 3 times daily -Patient instructed to continue to monitor her symptoms, strict return precautions given (including respiratory compromise, increased difficulty swallowing, fevers, inability to tolerate p.o., etc.).

## 2019-04-30 NOTE — Discharge Instructions (Addendum)
Take the prescribed medication as directed.  Keep an eye on the swelling and any redness of the neck. Follow-up with your primary care doctor this morning as scheduled. Return to the ED for new or worsening symptoms.

## 2019-04-30 NOTE — Assessment & Plan Note (Signed)
Incidental lung nodule of posterior left upper lobe measuring 6 mm appreciated on CT imaging.  Not appreciated on previous imaging. -Patient would benefit from noncontrast chest CT at 6-12 months.  If stable recommend future CT at 18-24 months (from today's scan) in high risk patient.

## 2019-04-30 NOTE — Progress Notes (Signed)
Called patient and there was no answer to gather pre visit information.  Tracy Rose, CMA'

## 2019-04-30 NOTE — Progress Notes (Signed)
[facility] Telemedicine Visit  Patient consented to have virtual visit. Method of visit: Telephone  Encounter participants: Patient: Tracy Rose - located at home Provider: Daisy Floro - located at clinic Others (if applicable): None  Chief Complaint: Rash  HPI: This is a pleasant patient who was just seen in the emergency department overnight for full body rash, as well as submental pain and swelling. Patient states since Sunday she has been having issues with her neck.  States she felt a "knot" pop up beneath her chin and started to have some swelling along the left chin and lower cheek.  She does feel like she is having some difficulty swallowing, but not from the inside.  States her throat itself does not feel sore.  States it feels like there is something pressing on her throat from the outside.  States she is also now developed a rash.  She thinks this is from her Lamictal.    However, she has been taking this for several years now, but she states she has had allergic reaction in the past when she accidentally missed doses and got restarted on her medication.  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 Rash is diffuse, located on her arms, neck, legs, breasts, abdomen, vaginal area, and buttocks.  States it is itching and burning.  She denies any shortness of breath.  She has not had any changes in her home medications.  No changes in soap, detergent, lotion, or other personal care products.  She does not have a pet.   In the emergency department the patient was treated with IV Benadryl for the "itching all over".  Was also given Solu-Medrol and Pepcid as well as some IV fluids for concern for allergic reaction.  Chest x-ray in the emergency department is normal.  CT scan of patient's neck shows "hazy fat stranding with swelling involving right greater than left submandibular spaces and submental region, findings are nonspecific but could reflect  sequelae of acute infection/cellulitis.  Possible allergic reaction with angioedema could be considered, no extension into floor of mouth at this time.  Mildly prominent submental and bilateral level lymph nodes appreciated.  2.2 cm left thyroid nodule appreciated."   Patient states she is feeling much better now.  She left the emergency department about 730 this morning, went to [employer] and ate a McMuffin, states she was able to tolerate the food well without any nausea or pain with swallowing.   Other CT findings: Patient noted to have 2.2 cm left thyroid nodule (old finding, stable). Additionally, 6 mm left upper lung nodule appreciated.  Would benefit from noncontrast chest CT at 6-12 months.    ROS: per HPI  Pertinent PMHx:  History of essential hypertension Tobacco use disorder Morbid obesity GERD Hyperlipidemia ADHD Major depressive disorder  Exam:  Respiratory: Speaking in complete sentences, comfortable work of breathing  Assessment/Plan: Thyroid nodule greater than or equal to 1.5 cm in diameter incidentally noted on imaging study 2.2 cm left thyroid nodule appreciated on imaging. Patient does have history of multinodular goiter.  Thyroid fine-needle aspiration showed findings consistent with benign follicular nodule in February 2016.  Previous biopsy 09/08/07 showed 3.3 x 2.0 x 2.3cm solid nodule in the lower pole. -No intervention at this time -Continue to monitor  Incidental lung nodule, > 64mm and < 43mm Incidental lung nodule of posterior left upper lobe measuring 6 mm appreciated on CT imaging.  Not appreciated on previous imaging. -Patient would  benefit from noncontrast chest CT at 6-12 months.  If stable recommend future CT at 18-24 months (from today's scan) in high risk patient.   Rash Most likely urticarial in nature, possibly due to drug reaction.  Patient feeling much better now after receiving IV fluids, Benadryl and Solu-Medrol. -Continue taking medications as  prescribed -Patient instructed to call or seek emergency care should symptoms return  Cellulitis Patient with reports of submental swelling with associated external pressure and difficulty swallowing.  No breathing compromise.  CT scan in the emergency department suggestive of acute infection/cellulitis.  Cannot exclude angioedema.  Patient treated with IV fluids, Solu-Medrol, Benadryl, and Pepcid for concerns of angioedema versus other allergic reaction.  Could consider Ludwig's angina as possible cause. -Clindamycin 300 mg p.o. 3 times daily -Patient instructed to continue to monitor her symptoms, strict return precautions given (including respiratory compromise, increased difficulty swallowing, fevers, inability to tolerate p.o., etc.).    Time spent during visit with patient: 9 minutes  Milus Banister, [place], PGY-2 04/30/2019 1:03 PM

## 2019-04-30 NOTE — Telephone Encounter (Signed)
I reviewed her ED report.  I called to check on her. She is feeling much better from itching standpoint. She will complete her A/B as instructed for facial cellulitis and f/u in our clinic.  She requested HHA since she is unable to complete her ADLs due to pain and her mental health. She is not suicidal.  I did advise her to contact her mental health provider today for medication adjustment.  I discussed CCM referral for HHA application and she agreed with the referral.

## 2019-04-30 NOTE — ED Provider Notes (Signed)
[facility] EMERGENCY DEPARTMENT Provider Note   CSN: [id number] Arrival date & time: 04/29/19  1759     History Chief Complaint  Patient presents with  . Allergic Reaction    Tracy Rose is a 54 y.o. female.  The history is provided by the patient and medical records.  Allergic Reaction Presenting symptoms: difficulty swallowing and rash     54 year old female with history of anemia, bipolar disorder, hypertension, multinodular goiter, hyperlipidemia, PTSD, depression, presenting to the ED with concern of possible allergic reaction.  Patient states since Sunday she has been having issues with her neck.  States she felt a "knot" pop up beneath her chin and started to have some swelling along the left chin and lower cheek.  She does feel like she is having some difficulty swallowing, but not from the inside.  States her throat itself does not feel sore.  States it feels like there is something pressing on her throat from the outside.  States she is also now developed a rash.  She thinks this is from her Lamictal.  Has been taking this for several years now, she has had allergic reaction in the past when she accidentally missed doses and got restarted on her medication.  Rash is localized to her arms, neck, legs, and buttocks.  States it is itching and burning.  She denies any shortness of breath.  She has not had any changes in her home medications.  No changes in soap, detergent, lotion, or other personal care products.  She does not have a pet.  Patient did call her primary care doctor and was sent in for further evaluation.  Past Medical History:  Diagnosis Date  . Anemia   . Anxiety state    Panic Attacks  . Bipolar disorder ([place])   . Cervical strain, acute, subsequent encounter 05/29/2016  . Concussion 10/13/2017  . Essential hypertension, benign 06/10/2015  . Genital herpes 12/12/2011  . GERD (gastroesophageal reflux disease) 08/27/2017  . GOITER, MULTINODULAR  09/05/2007   Dr. Zada Girt [facility]  [place] 2011: Stable to slightly smaller bilateral thyroid nodules since 09/04/2007. No new or enlarging thyroid nodules identified. Biopsy 08/2007: non neoplastic goiter   . H/O ETOH abuse    Sober since 06/03/1992  . Headache 03/19/2014  . Hyperlipidemia 05/18/2016   Diet controlled, no meds  . Mastalgia in female 06/10/2015  . Mild depression ([place]) 12/15/2013  . OBESITY, NOS 03/21/2006       . Post concussion syndrome 09/10/2013  . PTSD (post-traumatic stress disorder)   . S/P vaginal hysterectomy 01/29/2017  . SVD (spontaneous vaginal delivery)    x 1  . Tobacco abuse 09/08/2010  . Tobacco abuse     Patient Active Problem List   Diagnosis Date Noted  . Lumbar back pain with radiculopathy affecting left lower extremity 10/17/2017  . GERD (gastroesophageal reflux disease) 08/27/2017  . Hyperlipidemia 05/18/2016  . Breast mass, left 10/28/2015  . MDD (major depressive disorder), recurrent, severe, with psychosis ([place]) 08/06/2015  . Severe manic bipolar 1 disorder with psychotic behavior ([place]) 08/05/2015  . Essential hypertension, benign 06/10/2015  . ADHD (attention deficit hyperactivity disorder) 08/15/2014  . Menorrhagia 08/19/2013  . Macromastia 04/07/2013  . Genital herpes 12/12/2011  . Tobacco abuse 09/08/2010  . GOITER, MULTINODULAR 09/05/2007  . Morbid obesity ([place]) 03/21/2006    Past Surgical History:  Procedure Laterality Date  . BREAST BIOPSY Left 06/22/2015  . BREAST EXCISIONAL BIOPSY Left 09/21/2015  . BREAST LUMPECTOMY  WITH RADIOACTIVE SEED LOCALIZATION Left 09/21/2015   Procedure: LEFT BREAST LUMPECTOMY WITH RADIOACTIVE SEED LOCALIZATION;  Surgeon: Donnie Mesa, MD;  Location: [facility];  Service: General;  Laterality: Left;  . CERVICAL CERCLAGE     x 1  . RADIOLOGY WITH ANESTHESIA N/A 01/26/2014   Procedure: MRA NECK, MRI BRAIN WITH AND WITHOUT CONTRAST ;  Surgeon: Medication Radiologist, MD;  Location: [facility];  Service:  Radiology;  Laterality: N/A;  . RADIOLOGY WITH ANESTHESIA N/A 12/17/2017   Procedure: MRI OF BRAIN AND CERVICAL SPINE WITHOUT CONTRAST;  Surgeon: Radiologist, Medication, MD;  Location: [facility];  Service: Radiology;  Laterality: N/A;  . VAGINAL HYSTERECTOMY Bilateral 01/29/2017   Procedure: HYSTERECTOMY VAGINAL WITH SALPINGECTOMY;  Surgeon: Donnamae Jude, MD;  Location: [facility];  Service: Gynecology;  Laterality: Bilateral;  . WISDOM TOOTH EXTRACTION     @ age 57     OB History    Gravida  4   Para  2   Term  2   Preterm  0   AB  2   Living  1     SAB  0   TAB  2   Ectopic  0   Multiple  0   Live Births              Family History  Problem Relation Age of Onset  . Arthritis Mother   . Diabetes Father   . Arthritis Father   . Hypertension Father   . Hyperlipidemia Father   . Heart disease Father   . Stroke Father   . Cancer Maternal Grandfather   . Cancer Sister   . Migraines Neg Hx     Social History   Tobacco Use  . Smoking status: Current Every Day Smoker    Packs/day: 0.13    Years: 25.00    Pack years: 3.25    Types: Cigarettes    Last attempt to quit: 10/08/2017    Years since quitting: 1.5  . Smokeless tobacco: Never Used  Substance Use Topics  . Alcohol use: No    Alcohol/week: 0.0 standard drinks    Comment: Sober since 06/03/1992  . Drug use: No    Home Medications Prior to Admission medications   Medication Sig Start Date End Date Taking? Authorizing Provider  acyclovir (ZOVIRAX) 400 MG tablet TAKE 1 TABLET(400 MG) BY MOUTH TWICE DAILY Patient taking differently: Take 400 mg by mouth 2 (two) times daily as needed (for herpes).  11/09/16   Kinnie Feil, MD  amLODipine (NORVASC) 10 MG tablet Take 1 tablet (10 mg total) by mouth daily. 04/22/19   Kinnie Feil, MD  Baclofen 5 MG TABS Take 5 mg by mouth 2 (two) times daily.    [provider]  buPROPion (WELLBUTRIN SR) 100 MG 12 hr tablet Take 100 mg by mouth daily.  11/05/17   [provider]  Cyanocobalamin (VITAMIN B-12) 5000 MCG TBDP Take 10,000 mcg by mouth daily.    [provider]  gabapentin (NEURONTIN) 100 MG capsule TAKE 1 CAPSULE(100 MG) BY MOUTH THREE TIMES DAILY 04/22/19   Kinnie Feil, MD  hydrochlorothiazide (HYDRODIURIL) 12.5 MG tablet Take 1 tablet (12.5 mg total) by mouth daily. 04/22/19   Kinnie Feil, MD  Iron-Vitamins (GERITOL) LIQD Take 5 mLs by mouth 2 (two) times daily.    [provider]  lamoTRIgine (LAMICTAL) 25 MG tablet Take 50 mg by mouth 2 (two) times daily. 11/05/17   [provider]  nabumetone (RELAFEN) 750 MG tablet Take 750 mg by mouth daily.    [provider]  ondansetron (ZOFRAN) 4 MG tablet Take 1 tablet (4 mg total) by mouth every 8 (eight) hours as needed for nausea or vomiting. Patient not taking: Reported on 11/18/2018 08/20/17   Lovenia Kim, MD  prazosin (MINIPRESS) 1 MG capsule Take 1 mg by mouth at bedtime. 10/29/17   [provider]  SUMAtriptan (IMITREX) 50 MG tablet Take one tablet prn migraine headache. May repeat in 2 hours once if headache persists or recurs. Patient not taking: Reported on 11/18/2018 10/15/17   Kinnie Feil, MD  traZODone (DESYREL) 50 MG tablet TAKE 1 TABLET(50 MG) BY MOUTH AT BEDTIME AS NEEDED FOR SLEEP Patient not taking: Reported on 11/18/2018 12/23/17   Kinnie Feil, MD    Allergies    Nsaids and Dilaudid [hydromorphone hcl]  Review of Systems   Review of Systems  HENT: Positive for trouble swallowing.   Skin: Positive for rash.  All other systems reviewed and are negative.   Physical Exam Updated Vital Signs BP (!) 163/91 (BP Location: Left Arm)   Pulse 74   Temp 98.1 F (36.7 C) (Oral)   Resp 20   LMP  (LMP Unknown)   SpO2 98%   Physical Exam Vitals and nursing note reviewed.  Constitutional:      Appearance: She is well-developed.  HENT:     Head: Normocephalic and atraumatic.     Comments:  Tonsils are normal in appearance bilaterally, no edema, uvula midline, handling secretions well, normal phonation without stridor, no obvious dental infection or abscess, no oral lesions, no lip or tongue swelling, floor of mouth normal in appearance  There is swelling and tenderness of the submandibular region (? lymph node) with knot-like structure present, she does have some swelling of the left chin and left lower cheek but less so left neck, she is tender throughout the entirety of the neck including along the thyroid and left supraclavicular area, skin is warm to the touch Eyes:     Conjunctiva/sclera: Conjunctivae normal.     Pupils: Pupils are equal, round, and reactive to light.  Cardiovascular:     Rate and Rhythm: Normal rate and regular rhythm.     Heart sounds: Normal heart sounds.  Pulmonary:     Effort: Pulmonary effort is normal.     Breath sounds: Normal breath sounds.  Abdominal:     General: Bowel sounds are normal.     Palpations: Abdomen is soft.  Musculoskeletal:        General: Normal range of motion.     Cervical back: Normal range of motion.  Skin:    General: Skin is warm and dry.     Comments: Fine rash noted to bilateral forearms, lower neck, and medial thighs bilaterally, rash is pruritic with signs of excoriation, no signs of superimposed infection or cellulitis  Neurological:     Mental Status: She is alert and oriented to person, place, and time.     ED Results / Procedures / Treatments   Labs (all labs ordered are listed, but only abnormal results are displayed) Labs Reviewed  CBC WITH DIFFERENTIAL/PLATELET - Abnormal; Notable for the following components:      Result Value   RBC 5.31 (*)    All other components within normal limits  COMPREHENSIVE METABOLIC PANEL - Abnormal; Notable for the following components:   CO2 21 (*)    Glucose, Bld 110 (*)  Creatinine, Ser 1.07 (*)    Calcium 8.8 (*)    AST 12 (*)    Total Bilirubin 0.2 (*)    All  other components within normal limits  URINALYSIS, ROUTINE W REFLEX MICROSCOPIC    EKG EKG Interpretation  Date/Time:  Wednesday April 29 2019 18:15:38 EDT Ventricular Rate:  76 PR Interval:  126 QRS Duration: 78 QT Interval:  406 QTC Calculation: 456 R Axis:   38 Text Interpretation: Normal sinus rhythm Normal ECG No significant change since last tracing Confirmed by Merrily Pew [phone]) on 04/30/2019 1:27:13 AM   Radiology DG Chest 2 View  Result Date: 04/29/2019 CLINICAL DATA:  54 year old female with possible allergic medication reaction. Painful swallowing and urination. EXAM: CHEST - 2 VIEW COMPARISON:  Chest radiographs 06/07/2015 and earlier. FINDINGS: PA view. Lung volumes and mediastinal contours remain normal. Visualized tracheal air column is within normal limits. Both lungs appear stable and clear. No pneumothorax or pleural effusion. Negative visible bowel gas pattern. Osseous structures appear normal for age. IMPRESSION: Negative.  No cardiopulmonary abnormality. Electronically Signed   By: Genevie Ann M.D.   On: 04/29/2019 19:35    Procedures Procedures (including critical care time)  Medications Ordered in ED Medications  clindamycin (CLEOCIN) IVPB 600 mg (600 mg Intravenous New Bag/Given 04/30/19 0545)  methylPREDNISolone sodium succinate (SOLU-MEDROL) 125 mg/2 mL injection 125 mg (125 mg Intravenous Given 04/30/19 0249)  diphenhydrAMINE (BENADRYL) injection 25 mg (25 mg Intravenous Given 04/30/19 0249)  famotidine (PEPCID) IVPB 20 mg premix (0 mg Intravenous Stopped 04/30/19 0437)  sodium chloride 0.9 % bolus 1,000 mL (1,000 mLs Intravenous New Bag/Given 04/30/19 0250)  iohexol (OMNIPAQUE) 300 MG/ML solution 75 mL (75 mLs Intravenous Contrast Given 04/30/19 0315)    ED Course  I have reviewed the triage vital signs and the nursing notes.  Pertinent labs & imaging results that were available during my care of the patient were reviewed by me and considered in my medical decision  making (see chart for details).    MDM Rules/Calculators/A&P  54 year old female here with concern of allergic reaction.  Initially developed pain and swelling along the left side of her neck and a "knot-like" structure beneath her chin on Sunday.  Has since developed rash.  She was concerned this is due to her Lamictal.  She is afebrile and nontoxic in appearance here.  She does not have any significant oropharyngeal edema, no oral lesions.  She does have pruritic rash present but no signs of superimposed infection.  Along her chin in the submental area there is a enlarged structure which is questionably a lymph node, however she does have swelling along the left side of her neck and left chin and tenderness down through the thyroid and into the left supraclavicular area.  She does have history of multinodular goiter that has required biopsy in the past.  She has not had this evaluated in quite some time.  She has reported some difficulty swallowing and feels like there is pressure applied to the neck from the outside.  Will obtain CT soft tissue neck for further evaluation.  She was given IV fluids, Benadryl, Solu-Medrol, and Pepcid.  Will reassess.  Rash somewhat improved with steroids and benadryl, she is sleepy from medications.  CT with findings of likely cellulitis with reactive lymphadenopathy.  Consider angioedema based on read, however patient without any lip/tongue swelling, floor of mouth appears normal, and she has no airway compromise.   She is not on ACEI.  Will re-check  and discuss treatment options with her when a little more awake/alert.  5:01 AM Patient now much more awake/alert.  We have discussed her CT findings.  Face/neck/oral cavity exam appears unchanged.  Remains without airway compromise.  Clinically, this is not concerning for a true angioedema or Ludwig's angina.  Based on CT findings, there may be a degree of cellulitis, her skin does appear warm to the touch and given  associated lymphadenopathy she will require treatment.  We have discussed inpatient versus outpatient management of this, states she has an appointment with her primary care doctor in the morning and would prefer to see her so she can discuss her medications.  This seems reasonable as she is afebrile, not meeting SIRS criteria, with normal WBC count.  She does not appears toxic and is not displaying any signs of distress.  She is comfortable receiving dose of IV antibiotics and starting oral medications at home. She understands this may worsen which would require repeat ER evaluation.  She will see her PCP this morning, monitor symptoms closely, return here for any new/acute changes.  Shared visit with attending physician, Dr. Dayna Barker-- agrees with assessment and plan of care.  Final Clinical Impression(s) / ED Diagnoses Final diagnoses:  Cellulitis of neck    Rx / DC Orders ED Discharge Orders    None       Larene Pickett, PA-C 04/30/19 [id number]    Merrily Pew, MD 04/30/19 [phone]

## 2019-04-30 NOTE — ED Notes (Signed)
Patient verbalizes understanding of discharge instructions. Opportunity for questioning and answers were provided. Armband removed by staff, pt discharged from ED ambulatory.   

## 2019-04-30 NOTE — ED Provider Notes (Signed)
Medical screening examination/treatment/procedure(s) were conducted as a shared visit with non-physician practitioner(s) and myself.  I personally evaluated the patient during the encounter.  Here with progressively worsening pain and swelling to submental area since Sunday. No h/o same. Stated that sehe thought it was an allergic reaction as she has had that in the past with lamictal.  On my exam, tolerating secretions, no stridor, no hypoxia. Has significant ttp to submental area with woodiness to same. Skin is warm. No tachycardia or fever though. No sublingual edema or woodiness.  Ct scan with submental stranding and enlarged lymph nodes likely reactive.  Suspect she has some component of cellulitis, less likely angioedema. Would plan to start antibiotics. If patient with close follow up and ability to get antibiotics, come back if worsening and comfortable with discharge then discharge. If not, would admit for observation and iv antibiotics.   EKG Interpretation  Date/Time:  Wednesday April 29 2019 18:15:38 EDT Ventricular Rate:  76 PR Interval:  126 QRS Duration: 78 QT Interval:  406 QTC Calculation: 456 R Axis:   38 Text Interpretation: Normal sinus rhythm Normal ECG No significant change since last tracing Confirmed by Merrily Pew [phone]) on 04/30/2019 1:27:13 AM     Aashka Salomone, Corene Cornea, MD 04/30/19 [id number]

## 2019-04-30 NOTE — Assessment & Plan Note (Addendum)
2.2 cm left thyroid nodule appreciated on imaging. Patient does have history of multinodular goiter.  Thyroid fine-needle aspiration showed findings consistent with benign follicular nodule in February 2016.  Previous biopsy 09/08/07 showed 3.3 x 2.0 x 2.3cm solid nodule in the lower pole. -No intervention at this time -Continue to monitor

## 2019-05-07 ENCOUNTER — Ambulatory Visit: Payer: Self-pay | Admitting: Licensed Clinical Social Worker

## 2019-05-07 NOTE — Chronic Care Management (AMB) (Signed)
    Clinical Social Work  Care Management Outreach   05/07/2019 Name: Tracy Rose MRN: [id number] DOB: March 29, 1965  Tracy Rose is a 54 y.o. year old female who is a primary care patient of Kinnie Feil, MD . The Care Management team was consulted for assistance with Level of Care Concerns for [place].  LCSW reached out to [name] today by phone to introduce self, assess needs and offer Care Management services and interventions.  The outreach was unsuccessful.   Unable to leave a HIPPA compliant phone message due to voice box not being set up.  Review of patient status, including review of consultants reports, relevant laboratory and other test results, and collaboration with appropriate care team members and the patient's provider was performed as part of comprehensive patient evaluation and provision of care management services.    Follow Up Plan: LCSW will speak with patient during office visit next week with PCP to review Personal care services information.  Casimer Lanius, [place] / [place]   [phone] 9:32 AM

## 2019-05-12 ENCOUNTER — Encounter: Payer: Self-pay | Admitting: Family Medicine

## 2019-05-12 ENCOUNTER — Ambulatory Visit: Payer: Medicaid Other | Admitting: Licensed Clinical Social Worker

## 2019-05-12 ENCOUNTER — Ambulatory Visit (INDEPENDENT_AMBULATORY_CARE_PROVIDER_SITE_OTHER): Payer: Medicaid Other | Admitting: Family Medicine

## 2019-05-12 ENCOUNTER — Other Ambulatory Visit: Payer: Self-pay

## 2019-05-12 ENCOUNTER — Other Ambulatory Visit (HOSPITAL_COMMUNITY)
Admission: RE | Admit: 2019-05-12 | Discharge: 2019-05-12 | Disposition: A | Discharge status: Home / Self Care | Payer: Medicaid Other | Source: Ambulatory Visit | Attending: Family Medicine | Admitting: Family Medicine

## 2019-05-12 VITALS — BP 148/78 | HR 99 | Ht 66.0 in | Wt 238.0 lb

## 2019-05-12 DIAGNOSIS — F312 Bipolar disorder, current episode manic severe with psychotic features: Secondary | ICD-10-CM

## 2019-05-12 DIAGNOSIS — E669 Obesity, unspecified: Secondary | ICD-10-CM

## 2019-05-12 DIAGNOSIS — R0600 Dyspnea, unspecified: Secondary | ICD-10-CM | POA: Insufficient documentation

## 2019-05-12 DIAGNOSIS — N644 Mastodynia: Secondary | ICD-10-CM | POA: Diagnosis not present

## 2019-05-12 DIAGNOSIS — R0602 Shortness of breath: Secondary | ICD-10-CM | POA: Diagnosis not present

## 2019-05-12 DIAGNOSIS — Z202 Contact with and (suspected) exposure to infections with a predominantly sexual mode of transmission: Secondary | ICD-10-CM | POA: Diagnosis not present

## 2019-05-12 DIAGNOSIS — F333 Major depressive disorder, recurrent, severe with psychotic symptoms: Secondary | ICD-10-CM

## 2019-05-12 DIAGNOSIS — E041 Nontoxic single thyroid nodule: Secondary | ICD-10-CM | POA: Diagnosis not present

## 2019-05-12 DIAGNOSIS — R911 Solitary pulmonary nodule: Secondary | ICD-10-CM | POA: Diagnosis not present

## 2019-05-12 DIAGNOSIS — Z741 Need for assistance with personal care: Secondary | ICD-10-CM

## 2019-05-12 DIAGNOSIS — I10 Essential (primary) hypertension: Secondary | ICD-10-CM | POA: Diagnosis not present

## 2019-05-12 NOTE — Assessment & Plan Note (Signed)
She is off her meds. BP slightly elevated today despite not on meds. I will monitor off meds for now. F/U in 2 weeks for reassessment.

## 2019-05-12 NOTE — Assessment & Plan Note (Signed)
Off med due to allergic reaction. Psych f/u later today.

## 2019-05-12 NOTE — Chronic Care Management (AMB) (Signed)
Care Management   Clinical Social Work initial Note  05/12/2019 Name: Tracy Rose MRN: [id number] DOB: 1965/06/07  The Care Management team was consulted by PCP to assist the patient with Level of Care Concerns for [place].  Tracy Rose is a 54 y.o. year old female who sees Gwendlyn Deutscher, Phill Myron, MD for primary care. LCSW met with  Tracy Rose today after visit with PCP. Assessment: Patient is pleasant and engaged in conversation.  Experiencing  difficulty with meeting activities of daily living.      Review of patient status, including review of consultants reports, relevant laboratory and other test results, and collaboration with appropriate care team members and the patient's provider was performed as part of comprehensive patient evaluation and provision of chronic care management services.    Advance Directive Status: Not addressed during this encounter SDOH (Social Determinants of Health) assessments performed: No:   Goals Addressed            This Visit's Progress   . Personal Care Services       CARE PLAN ENTRY (see longitudinal plan of care for additional care plan information)  Current Barriers:   . Patient with chronic conditions needs Support, Education, and Care Coordination to resolve unmet Personal Care needs . Patient needs PCP to complete PCS Referral Clinical Social Work Goal(s):  Marland Kitchen Over the next 30 to 45 days, patient will have personal care needs met as evident by having PCS Aide in the home assisting with needs. . Over the next 30 days patient will work with LCSW, and [facility] to coordinate care for [facility] and select a personal care service provider  Interventions provided by LCSW : . Assessed needs, level of care concerns, basic eligibility and provided education on Personal Care Service process which includes selecting an agency  . Collaborate with primary care provider ref completing PCS referral ( Referral placed in PCP's  mailbox) . PCS referral will be faxed to [employer] at [phone] once completed and signed by PCP . LCSW will collaborate with [facility] to verify application is received and processed.  Patient Self Care Activities & Deficits:  . Patient is unable to perform ADLs independently without assistance  . Family/support system will assist patient with meeting needs until [facility] is approved . Return calls from [facility] to set up initial PCS assessment once application is approved . Call [facility] with questions [phone] or [phone]  Initial goal documentation      Outpatient Encounter Medications as of 05/12/2019  Medication Sig  . acyclovir (ZOVIRAX) 400 MG tablet TAKE 1 TABLET(400 MG) BY MOUTH TWICE DAILY (Patient taking differently: Take 400 mg by mouth 2 (two) times daily as needed (for herpes). )  . amLODipine (NORVASC) 10 MG tablet Take 1 tablet (10 mg total) by mouth daily.  . Baclofen 5 MG TABS Take 5 mg by mouth 2 (two) times daily.  Marland Kitchen buPROPion (WELLBUTRIN SR) 100 MG 12 hr tablet Take 100 mg by mouth daily.  . clindamycin (CLEOCIN) 150 MG capsule Take 2 capsules (300 mg total) by mouth 3 (three) times daily. May dispense as 152m capsules  . Cyanocobalamin (VITAMIN B-12) 5000 MCG TBDP Take 10,000 mcg by mouth daily.  . diphenhydrAMINE (BENADRYL) 25 mg capsule Take 1 capsule (25 mg total) by mouth every 6 (six) hours as needed.  . gabapentin (NEURONTIN) 100 MG capsule TAKE 1 CAPSULE(100 MG) BY MOUTH THREE TIMES DAILY  .  hydrochlorothiazide (HYDRODIURIL) 12.5 MG tablet Take 1 tablet (12.5 mg total) by mouth daily.  . Iron-Vitamins (GERITOL) LIQD Take 5 mLs by mouth 2 (two) times daily.  Marland Kitchen lamoTRIgine (LAMICTAL) 25 MG tablet Take 50 mg by mouth 2 (two) times daily.  . nabumetone (RELAFEN) 750 MG tablet Take 750 mg by mouth daily.  . prazosin (MINIPRESS) 1 MG capsule Take 1 mg by mouth at bedtime.   No facility-administered encounter  medications on file as of 05/12/2019.   Patient agreed to services provided today and verbal consent obtained.   Follow Up Plan: LCSW will F/U with patient in 2 weeks  Casimer Lanius, [place] / [place]   [phone] 11:04 AM

## 2019-05-12 NOTE — Patient Instructions (Signed)
Pulmonary Nodule A pulmonary nodule is tissue that has grown on your lung. A nodule may be cancer, but most nodules are not cancer. Follow these instructions at home:   Take over-the-counter and prescription medicines only as told by your doctor.  Do not use any products that have nicotine or tobacco, such as cigarettes and e-cigarettes. If you need help quitting, ask your doctor.  Keep all follow-up visits as told by your doctor. This is important. Contact a doctor if:  You have trouble breathing when doing activities.  You feel sick.  You feel more tired than normal.  You do not feel like eating.  You lose weight without trying.  You have chills.  You have night sweats. Get help right away if:  You cannot catch your breath.  You start making whistling sounds when breathing (wheezing).  You cannot stop coughing.  You cough up blood.  You get dizzy.  You feel like you are going to pass out (faint).  You have sudden chest pain.  You have a fever or symptoms for more than 2-3 days.  You have a fever and your symptoms suddenly get worse. Summary  A pulmonary nodule is tissue that has grown on your lung.  Most nodules are not cancer.  Your doctor will do tests to know what kind of nodule you have, and whether you need treatment for it. This information is not intended to replace advice given to you by your health care provider. Make sure you discuss any questions you have with your health care provider. Document Revised: 02/01/2017 Document Reviewed: 02/07/2016 Elsevier Patient Education  2020 [employer]vier Inc.  

## 2019-05-12 NOTE — Assessment & Plan Note (Signed)
Currently asymptomatic. Likely anxiety related. ECHO ordered to r/o cardiac pathology.

## 2019-05-12 NOTE — Patient Instructions (Signed)
Licensed Clinical Social Worker Visit Information Ms. Helm  it was nice speaking with you. Please call me directly if you have questions [phone] Goals we discussed today: personal care services Goals Addressed            This Visit's Progress   . Personal Care Services       CARE PLAN ENTRY (see longitudinal plan of care for additional care plan information)  Current Barriers:   . Patient with chronic conditions needs Support, Education, and Care Coordination to resolve unmet Personal Care needs . Patient needs PCP to complete PCS Referral Clinical Social Work Goal(s):  Marland Kitchen Over the next 30 to 45 days, patient will have personal care needs met as evident by having PCS Aide in the home assisting with needs. . Over the next 30 days patient will work with LCSW, and [facility] to coordinate care for [facility] and select a personal care service provider  Interventions provided by LCSW : . Assessed needs, level of care concerns, basic eligibility and provided education on Personal Care Service process which includes selecting an agency  . Collaborate with primary care provider ref completing PCS referral ( Referral placed in PCP's mailbox) . PCS referral will be faxed to [employer] at [phone] once completed and signed by PCP . LCSW will collaborate with [facility] to verify application is received and processed.  Patient Self Care Activities & Deficits:  . Patient is unable to perform ADLs independently without assistance  . Family/support system will assist patient with meeting needs until [facility] is approved . Return calls from [facility] to set up initial PCS assessment once application is approved . Call [facility] with questions [phone] or [phone]  Initial goal documentatio     Materials provided: Yes: PCS information  [name] received Care Management services today:  1. Care Management services include  personalized support from designated clinical staff supervised by her physician, including individualized plan of care and coordination with other care providers 2. 24/7 contact [phone] for assistance for urgent and routine care needs. 3. Care Management are voluntary services and be declined at any time by calling the office.  Patient verbally agreed to assistance and services provided by embedded care coordination/care management team today.   Patient verbalizes understanding of instructions provided today.  Follow up plan:  SW will follow up with patient by phone over the next two weeks  Maurine Cane, LCSW

## 2019-05-12 NOTE — Assessment & Plan Note (Signed)
She is concerned this has increased in size. No thyromegaly on exam. She had full work-up in the past which were benign. She will still like to get this checked. [place] ordered.

## 2019-05-12 NOTE — Progress Notes (Signed)
    SUBJECTIVE:   CHIEF COMPLAINT / HPI:   HTN: She stopped taking all medications due to a recent rash/allergic reaction. She is not sure which of her medication is causing this. Hence she d/c all of them together.   Thyroid nodules: Endorsed increased size.  Lung Nodules: Endorses occasional cough, chest pain, and SOB on and off with walking and rushing too fast to the bathroom or if she talks too much. She denies any of those symptoms now. SOB started four months ago and is progressively worsening. She is working on cutting back on tobacco smoking. She wants to get a repeat CT chest sooner than later.  Mental health issues: Off all medications. She has f/u appointment with her Psychiatrist today.  Breast pain: C/O B/L breast pain, sharp pain in nature and sometimes pulsatile x 1 year, now worsening. No nipple discharge.   PERTINENT  PMH / PSH: PMX reviewed  OBJECTIVE:   BP (!) 148/78   Pulse 99   Ht 5\' 6"  (1.676 m)   Wt 238 lb (108 kg)   LMP  (LMP Unknown)   SpO2 99%   BMI 38.41 kg/m   Physical Exam Vitals and nursing note reviewed.  Neck:     Comments: No thyromegaly Cardiovascular:     Rate and Rhythm: Normal rate and regular rhythm.     Heart sounds: Normal heart sounds. No murmur.  Pulmonary:     Effort: Pulmonary effort is normal. No respiratory distress.     Breath sounds: Normal breath sounds. No stridor. No wheezing or rhonchi.      ASSESSMENT/PLAN:   Essential hypertension, benign She is off her meds. BP slightly elevated today despite not on meds. I will monitor off meds for now. F/U in 2 weeks for reassessment.  Thyroid nodule greater than or equal to 1.5 cm in diameter incidentally noted on imaging study She is concerned this has increased in size. No thyromegaly on exam. She had full work-up in the past which were benign. She will still like to get this checked. [place] ordered.  Incidental lung nodule, > 31mm and < 53mm Report reviewed and discussed  with her. Repeat CT chest is recommended in 6 months. Patient wants a repeat scan now. We reached a common agreement for a second opinion with a pulmonologist instead. Referral placed.  MDD (major depressive disorder), recurrent, severe, with psychosis ([place]) Off med due to allergic reaction. Psych f/u later today.  Severe manic bipolar 1 disorder with psychotic behavior ([place]) Off med due to allergic reaction. Psych f/u later today.  Dyspnea Currently asymptomatic. Likely anxiety related. ECHO ordered to r/o cardiac pathology.   Breast pain: Diagnostic mammogram ordered.  At the end of the visit, she requested STD check. She is concerned because her late husband who died 3 years ago had HIV/AIDS. She denies recent sex encounter but will like to get checked for everything. HIV, RPR, Urine GC/Chlamydia checked today.  Tracy Mews, MD [place]

## 2019-05-12 NOTE — Assessment & Plan Note (Signed)
Report reviewed and discussed with her. Repeat CT chest is recommended in 6 months. Patient wants a repeat scan now. We reached a common agreement for a second opinion with a pulmonologist instead. Referral placed.

## 2019-05-13 ENCOUNTER — Other Ambulatory Visit: Payer: Self-pay | Admitting: Family Medicine

## 2019-05-13 ENCOUNTER — Ambulatory Visit (HOSPITAL_COMMUNITY)
Admission: RE | Admit: 2019-05-13 | Discharge: 2019-05-13 | Disposition: A | Discharge status: Home / Self Care | Payer: Medicaid Other | Source: Ambulatory Visit | Attending: Family Medicine | Admitting: Family Medicine

## 2019-05-13 ENCOUNTER — Telehealth: Payer: Self-pay | Admitting: Family Medicine

## 2019-05-13 DIAGNOSIS — I10 Essential (primary) hypertension: Secondary | ICD-10-CM | POA: Diagnosis not present

## 2019-05-13 DIAGNOSIS — F17210 Nicotine dependence, cigarettes, uncomplicated: Secondary | ICD-10-CM | POA: Insufficient documentation

## 2019-05-13 DIAGNOSIS — N644 Mastodynia: Secondary | ICD-10-CM

## 2019-05-13 DIAGNOSIS — R0600 Dyspnea, unspecified: Secondary | ICD-10-CM | POA: Diagnosis present

## 2019-05-13 DIAGNOSIS — R0602 Shortness of breath: Secondary | ICD-10-CM | POA: Diagnosis not present

## 2019-05-13 LAB — RPR: RPR Ser Ql: NONREACTIVE

## 2019-05-13 LAB — URINE CYTOLOGY ANCILLARY ONLY
Chlamydia: NEGATIVE
Comment: NEGATIVE
Comment: NEGATIVE
Comment: NORMAL
Neisseria Gonorrhea: NEGATIVE
Trichomonas: NEGATIVE

## 2019-05-13 LAB — HIV ANTIBODY (ROUTINE TESTING W REFLEX): HIV Screen 4th Generation wRfx: NONREACTIVE

## 2019-05-13 NOTE — Progress Notes (Signed)
Patient scheduled for her thyroid ultrasound on 05-25-19.  Provider contacted patient to make bp follow up.  Lovell Nuttall,CMA

## 2019-05-13 NOTE — Progress Notes (Signed)
  Echocardiogram 2D Echocardiogram has been performed.  Matilde Bash 05/13/2019, 11:37 AM

## 2019-05-13 NOTE — Telephone Encounter (Signed)
Unable to leave a message.  ECHO shows G1DD which is pretty benign and common in most population. Will discuss result when she returns call or at her follow-up.

## 2019-05-19 ENCOUNTER — Other Ambulatory Visit: Payer: Self-pay | Admitting: Family Medicine

## 2019-05-19 ENCOUNTER — Telehealth: Payer: Self-pay | Admitting: Family Medicine

## 2019-05-19 DIAGNOSIS — Z1211 Encounter for screening for malignant neoplasm of colon: Secondary | ICD-10-CM

## 2019-05-19 NOTE — Telephone Encounter (Signed)
Will forward to MD to place a new referral.  Lameisha Schuenemann,CMA

## 2019-05-19 NOTE — Telephone Encounter (Signed)
Done

## 2019-05-19 NOTE — Telephone Encounter (Signed)
Pt needs a new referral for Colonoscopy, the previous referral has expired. jw

## 2019-05-21 ENCOUNTER — Ambulatory Visit: Payer: Self-pay | Admitting: Licensed Clinical Social Worker

## 2019-05-21 NOTE — Chronic Care Management (AMB) (Signed)
   Social Work  Care Management Collaboration 05/21/2019 Name: Tracy Rose MRN: [id number] DOB: 19-Jun-1965  Tracy Rose is a 54 y.o. year old female who sees Tracy Feil, MD for primary care. LCSW was consulted by PCP to assistance patient with  Level of Care Concerns for [place]. Patient was not interviewed or contacted during this encounter.    Intervention: LCSW collaborated with [facility] for update on progress of PCS referral. Review of patient status, including review of consultants reports, relevant laboratory and other test results, and collaboration with appropriate care team members and the patient's provider was performed as part of comprehensive patient evaluation and provision of chronic care management services.   Goals Addressed            This Visit's Progress   . Personal Care Services   On track    CARE PLAN ENTRY (see longitudinal plan of care for additional care plan information)  Current Barriers & Progress:   . Patient with chronic conditions needs Support, Education, and Care Coordination to resolve unmet Personal Care needs . PCS assessment completed April 23rd . Patient has selected [facility] as her agency Clinical Social Work Goal(s):  Marland Kitchen Over the next 30 to 45 days, patient will have personal care needs met as evident by having PCS Aide in the home assisting with needs. . Over the next 30 days patient will work with LCSW, and [facility] to coordinate care for [facility] and select a personal care service provider  Interventions provided by LCSW : . LCSW will collaborated with [facility] to verify application is received and processed.  Patient Self Care Activities & Deficits:  . Patient is unable to perform ADLs independently without assistance  . Family/support system will assist patient with meeting needs until [facility] is approved . Return calls from [facility] to set up initial PCS assessment once  application is approved . Call [facility] with questions [phone] or [phone]  Please see past updates related to this goal by clicking on the "Past Updates" button in the selected goal      Plan: LCSW will F/U with patient in 7 to 10 days  Tracy Rose, [place] / [place]   [phone] 10:05 AM

## 2019-05-25 ENCOUNTER — Other Ambulatory Visit: Payer: Self-pay

## 2019-05-25 ENCOUNTER — Ambulatory Visit
Admission: RE | Admit: 2019-05-25 | Discharge: 2019-05-25 | Disposition: A | Discharge status: Home / Self Care | Payer: Medicaid Other | Source: Ambulatory Visit | Attending: Family Medicine | Admitting: Family Medicine

## 2019-05-25 DIAGNOSIS — N644 Mastodynia: Secondary | ICD-10-CM

## 2019-05-25 DIAGNOSIS — E041 Nontoxic single thyroid nodule: Secondary | ICD-10-CM

## 2019-05-27 ENCOUNTER — Ambulatory Visit: Payer: Medicaid Other | Admitting: Licensed Clinical Social Worker

## 2019-05-27 ENCOUNTER — Other Ambulatory Visit: Payer: Self-pay

## 2019-05-27 DIAGNOSIS — Z741 Need for assistance with personal care: Secondary | ICD-10-CM

## 2019-05-27 NOTE — Patient Instructions (Signed)
Licensed Clinical Social Worker Visit Information Tracy Rose  it was nice speaking with you. Please call me directly if you have questions [phone] Goals we discussed today: personal care services Goals Addressed            This Visit's Progress   . Personal Care Services   On track    CARE PLAN ENTRY (see longitudinal plan of care for additional care plan information)  Current Barriers & Progress:   . Patient with chronic conditions needs Support, Education, and Care Coordination to resolve unmet Personal Care needs . PCS assessment completed April 23rd she has selected [facility] as her agency . No one has called her to start services Clinical Social Work Goal(s):  Marland Kitchen Over the next 30 to 45 days, patient will have personal care needs met as evident by having PCS Aide in the home assisting with needs. . Over the next 30 days patient will work with Tracy Rose, and Tracy Rose to coordinate care for Tracy Rose and select a personal care service provider  Interventions provided by LCSW : . Tracy Rose [phone] with patient on phone spoke to Tracy Rose ,they have not received information from [name] .  LCSW called [facility] for update on referral, PCS declined by Tracy Rose. Information sent to patient's 2nd agency choice Tracy Rose. Marland Kitchen Provided patient with phone number to call Tracy Rose [phone] Patient Self Care Activities & Deficits:  . Patient is unable to perform ADLs independently without assistance  . Family/support system will assist patient with meeting needs until Tracy Rose is approved . Patient will call Tracy Rose if no one has called her by Friday . Return calls from Tracy Rose to set up initial PCS assessment once application is approved . Call Tracy Rose with questions [phone] or [phone]  Please see past updates related to this goal by clicking on the "Past Updates" button in the selected goal       Materials provided: Yes: phone  number for Tracy Rose agency Ms. Tracy Rose received Care Management services today:  1. Care Management services include personalized support from designated clinical staff supervised by her physician, including individualized plan of care and coordination with other care providers 2. 24/7 contact [phone] for assistance for urgent and routine care needs. 3. Care Management are voluntary services and be declined at any time by calling the office.  Patient  verbally agreed to assistance and services provided by embedded care coordination/care management team today.   Patient verbalizes understanding of instructions provided today.  Follow up plan:  SW will follow up with patient by phone over the next two weeks  Tracy Cane, LCSW

## 2019-05-27 NOTE — Chronic Care Management (AMB) (Signed)
Care Management   Clinical Social Work Follow Up   05/27/2019 Name: LENISE JR MRN: [id number] DOB: 1965/10/15  Referred by: Kinnie Feil, MD  Reason for referral : Care Coordination (PCS update)  Tracy Rose is a 54 y.o. year old female who is a primary care patient of Eniola, Phill Myron, MD.  Reason for follow-up: Phone encounter with patient today for ongoing assessment and brief interventions to assist with care coordination needs.   Assessment: Patient continues to experience to experience difficulty with ADL's and has not heard back from [facility] agency.  She drove to office today thinking appointment with LCSW was face to face.  [place] office patient with gas card to get home and back to appointment next week with with PCP.    Review of patient status, including review of consultants reports, relevant laboratory and other test results, and collaboration with appropriate care team members and the patient's provider was performed as part of comprehensive patient evaluation and provision of care management services.    Goals Addressed            This Visit's Progress   . Personal Care Services   On track    CARE PLAN ENTRY (see longitudinal plan of care for additional care plan information)  Current Barriers & Progress:   . Patient with chronic conditions needs Support, Education, and Care Coordination to resolve unmet Personal Care needs . PCS assessment completed April 23rd she has selected [facility] as her agency . No one has called her to start services Clinical Social Work Goal(s):  Marland Kitchen Over the next 30 to 45 days, patient will have personal care needs met as evident by having PCS Aide in the home assisting with needs. . Over the next 30 days patient will work with [place], and  [facility]ospital to coordinate care for [facility] and select a personal care service provider  Interventions provided by LCSW : . [name] [phone] with patient on phone spoke to [place] ,they  have not received information from [place] .  LCSW called [facility] for update on referral, PCS declined by [facility]. Information sent to patient's 2nd agency choice [name]. Marland Kitchen Provided patient with phone number to call [place] [phone] Patient Self Care Activities & Deficits:  . Patient is unable to perform ADLs independently without assistance  . Family/support system will assist patient with meeting needs until [facility] is approved . Patient will call [name] if no one has called her by Friday . Return calls from [facility] to set up initial PCS assessment once application is approved . Call [facility] with questions [phone] or [phone]  Please see past updates related to this goal by clicking on the "Past Updates" button in the selected goal        Outpatient Encounter Medications as of 05/27/2019  Medication Sig  . acyclovir (ZOVIRAX) 400 MG tablet TAKE 1 TABLET(400 MG) BY MOUTH TWICE DAILY (Patient taking differently: Take 400 mg by mouth 2 (two) times daily as needed (for herpes). )  . amLODipine (NORVASC) 10 MG tablet Take 1 tablet (10 mg total) by mouth daily.  . Baclofen 5 MG TABS Take 5 mg by mouth 2 (two) times daily.  Marland Kitchen buPROPion (WELLBUTRIN SR) 100 MG 12 hr tablet Take 100 mg by mouth daily.  . clindamycin (CLEOCIN) 150 MG capsule Take 2 capsules (300 mg total) by mouth 3 (three) times daily. May dispense as 131m capsules  . Cyanocobalamin (VITAMIN B-12)  5000 MCG TBDP Take 10,000 mcg by mouth daily.  . diphenhydrAMINE (BENADRYL) 25 mg capsule Take 1 capsule (25 mg total) by mouth every 6 (six) hours as needed.  . gabapentin (NEURONTIN) 100 MG capsule TAKE 1 CAPSULE(100 MG) BY MOUTH THREE TIMES DAILY  . hydrochlorothiazide (HYDRODIURIL) 12.5 MG tablet Take 1 tablet (12.5 mg total) by mouth daily.  . Iron-Vitamins (GERITOL) LIQD Take 5 mLs by mouth 2 (two) times daily.  Marland Kitchen lamoTRIgine (LAMICTAL) 25 MG tablet Take 50 mg by mouth 2 (two)  times daily.  . nabumetone (RELAFEN) 750 MG tablet Take 750 mg by mouth daily.  . prazosin (MINIPRESS) 1 MG capsule Take 1 mg by mouth at bedtime.   No facility-administered encounter medications on file as of 05/27/2019.   Plan: LCSW will F/U with patient in two weeks  Casimer Lanius, [place] / [place]   [phone] 1:51 PM

## 2019-06-02 ENCOUNTER — Ambulatory Visit: Payer: Medicaid Other | Admitting: Family Medicine

## 2019-06-05 ENCOUNTER — Institutional Professional Consult (permissible substitution): Payer: Medicaid Other | Admitting: Pulmonary Disease

## 2019-06-10 ENCOUNTER — Ambulatory Visit: Payer: Medicaid Other | Admitting: Licensed Clinical Social Worker

## 2019-06-10 ENCOUNTER — Other Ambulatory Visit: Payer: Self-pay

## 2019-06-10 DIAGNOSIS — Z741 Need for assistance with personal care: Secondary | ICD-10-CM

## 2019-06-11 NOTE — Chronic Care Management (AMB) (Signed)
Care Management   Clinical Social Work Follow Up   06/11/2019 Name: DEVI HOPMAN MRN: [id number] DOB: [birth year]  Referred by: Kinnie Feil, MD  Reason for referral : Care Coordination (PCS) WOODROW DULSKI is a 54 y.o. year old female who is a primary care patient of Eniola, Phill Myron, MD.   Reason for follow-up: Phone encounter with patient today for ongoing assessment and care coordination to assist setting up personal care services. Assessment: Patient continues to experience barriers with establishing with a provider for PCS.  LSCW continues to assist patient with remove barriers to care.  Review of patient status, including review of consultants reports, relevant laboratory and other test results, and collaboration with appropriate care team members and the patient's provider was performed as part of comprehensive patient evaluation and provision of care management services.    Advance Directive Status: Not address during this encounter.  SDOH (Social Determinants of Health) assessments performed:No needs identified   Goals Addressed            This Visit's Progress   . Personal Care Services       CARE PLAN ENTRY (see longitudinal plan of care for additional care plan information)  Current Barriers & Progress:   . Patient with chronic conditions needs Support, Education, and Care Coordination to resolve unmet Personal Care needs . PCS assessment completed April 23rd However no PCS services have started Clinical Social Work Goal(s):  Marland Kitchen Over the next 30 to 45 days, patient will have personal care needs met as evident by having PCS Aide in the home assisting with needs. . Over the next 30 days patient will work with LCSW, and [facility] to coordinate care for [facility] and select a personal care service provider  Interventions provided by LCSW : . [place] [facility] [phone] to assess barriers, they have not received a referral from [place], but  willing to take patient on to provide services . LCSW called [facility] with patient on line to assess barriers to [place] has referral patient to [facility]  Patient Self Care Activities & Deficits:  . Patient is unable to perform ADLs independently without assistance  . Family/support system will assist patient with meeting needs until [facility] is approved . Return calls from [facility] to set up initial PCS assessment once application is approved . Call [facility] with questions [phone] or [phone]  . Call [facility] if no call in 3 to 5 days [phone] Please see past updates related to this goal by clicking on the "Past Updates" button in the selected goal        Outpatient Encounter Medications as of 06/10/2019  Medication Sig  . acyclovir (ZOVIRAX) 400 MG tablet TAKE 1 TABLET(400 MG) BY MOUTH TWICE DAILY (Patient taking differently: Take 400 mg by mouth 2 (two) times daily as needed (for herpes). )  . amLODipine (NORVASC) 10 MG tablet Take 1 tablet (10 mg total) by mouth daily.  . Baclofen 5 MG TABS Take 5 mg by mouth 2 (two) times daily.  Marland Kitchen buPROPion (WELLBUTRIN SR) 100 MG 12 hr tablet Take 100 mg by mouth daily.  . clindamycin (CLEOCIN) 150 MG capsule Take 2 capsules (300 mg total) by mouth 3 (three) times daily. May dispense as 161m capsules  . Cyanocobalamin (VITAMIN B-12) 5000 MCG TBDP Take 10,000 mcg by mouth daily.  . diphenhydrAMINE (BENADRYL) 25 mg capsule Take 1 capsule (25 mg total) by mouth every  6 (six) hours as needed.  . gabapentin (NEURONTIN) 100 MG capsule TAKE 1 CAPSULE(100 MG) BY MOUTH THREE TIMES DAILY  . hydrochlorothiazide (HYDRODIURIL) 12.5 MG tablet Take 1 tablet (12.5 mg total) by mouth daily.  . Iron-Vitamins (GERITOL) LIQD Take 5 mLs by mouth 2 (two) times daily.  Marland Kitchen lamoTRIgine (LAMICTAL) 25 MG tablet Take 50 mg by mouth 2 (two) times daily.  . nabumetone (RELAFEN) 750 MG tablet Take 750 mg by mouth daily.    . prazosin (MINIPRESS) 1 MG capsule Take 1 mg by mouth at bedtime.   No facility-administered encounter medications on file as of 06/10/2019.   Plan:  1. LCSW will F/U with agency in 3 to 5 days 2. LCSW will contact patient in 2 to 3 weeks  Casimer Lanius, [place] / [place]   [phone] 12:04 PM

## 2019-06-11 NOTE — Patient Instructions (Signed)
Licensed Clinical Social Worker Visit Information Tracy Rose  it was nice speaking with you. Please call me directly if you have questions [phone] Goals we discussed today:  Goals Addressed            This Visit's Progress   . Personal Care Services       CARE PLAN ENTRY (see longitudinal plan of care for additional care plan information)  Current Barriers & Progress:   . Patient with chronic conditions needs Support, Education, and Care Coordination to resolve unmet Personal Care needs . PCS assessment completed April 23rd However no PCS services have started Clinical Social Work Goal(s):  Marland Kitchen Over the next 30 to 45 days, patient will have personal care needs met as evident by having PCS Aide in the home assisting with needs. . Over the next 30 days patient will work with LCSW, and [facility] to coordinate care for [facility] and select a personal care service provider  Interventions provided by LCSW : . [place] [phone] to assess barriers, they have not received a referral from [place], but willing to take patient on to provide services . LCSW called [facility] with patient on line to assess barriers to [place] has referral patient to [facility]  Patient Self Care Activities & Deficits:  . Patient is unable to perform ADLs independently without assistance  . Family/support system will assist patient with meeting needs until [facility] is approved . Return calls from [facility] to set up initial PCS assessment once application is approved . Call [facility] with questions [phone] or [phone]  . Call [facility] if no call in 3 to 5 days [phone] Please see past updates related to this goal by clicking on the "Past Updates" button in the selected goal        Materials provided:  Tracy Rose received Care Management services today:  1. Care Management services include personalized support from  designated clinical staff supervised by her physician, including individualized plan of care and coordination with other care providers 2. 24/7 contact [phone] for assistance for urgent and routine care needs. 3. Care Management are voluntary services and be declined at any time by calling the office.    Patient verbalizes understanding of instructions provided today.  Follow up plan:  SW will follow up with patient by phone over the next 2 to 3 weeks  Maurine Cane, LCSW

## 2019-06-16 ENCOUNTER — Ambulatory Visit: Payer: Self-pay | Admitting: Licensed Clinical Social Worker

## 2019-06-16 DIAGNOSIS — Z741 Need for assistance with personal care: Secondary | ICD-10-CM

## 2019-06-16 NOTE — Chronic Care Management (AMB) (Signed)
   Social Work  Care Management Collaboration 06/16/2019 Name: Tracy Rose MRN: [id number] DOB: 10-06-1965 Tracy Rose is a 54 y.o. year old female who sees Kinnie Feil, MD for primary care. LCSW was consulted to assistance patient with Personal Care Service. Patient continues to experience barriers to getting care that are out of her control.  Intervention: Patient was not interviewed or contacted during this encounter.  LCSW collaborated with [facility] and [place].  Review of patient status, including review of consultants reports, relevant laboratory and other test results, and collaboration with appropriate care team members and the patient's provider was performed as part of comprehensive patient evaluation and provision of chronic care management services.   Goals Addressed            This Visit's Progress   . Personal Care Services   On track    CARE PLAN ENTRY (see longitudinal plan of care for additional care plan information) Current Barriers & Progress:   . Patient with chronic conditions needs Support, Education, and Care Coordination to resolve unmet Personal Care needs . PCS assessment completed April 23rd However no PCS services have started Clinical Social Work Goal(s):  Marland Kitchen Over the next 30 to 45 days, patient will have personal care needs met as evident by having PCS Aide in the home assisting with needs. . Over the next 30 days patient will work with LCSW, and [facility] to coordinate care for [facility] and select a personal care service provider  Interventions provided by LCSW : . Called [facility] to assess barriers, they have not received a referral from [place], but willing to take patient on to provide services . LCSW called [facility] to assess barriers to [facility] services, referral sent to [place] was rejected . LCSW spoke with [facility] [phone] referral sent to different agency this location is able to take new  patient's.  Provided correct NPI # [id number] . Returned call to [facility] care to request that they resend referral to [facility] with correct NPI.  Patient Self Care Activities & Deficits:  . Patient is unable to perform ADLs independently without assistance  . Family/support system will assist patient with meeting needs until [facility] is approved . Return calls from [facility] to set up initial PCS assessment once application is approved . Call [facility] with questions [phone] or [phone]  Please see past updates related to this goal by clicking on the "Past Updates" button in the selected goal      Plan:  LCSW will F/U with [facility] in 3 to 5 days.  Casimer Lanius, [place] / [place]   [phone] 9:56 AM

## 2019-06-17 NOTE — Patient Instructions (Signed)
Licensed Clinical Social Worker Visit Information Tracy Rose  it was nice speaking with you. Please call me directly if you have questions [phone] Goals we discussed today:  Goals Addressed            This Visit's Progress   . Personal Care Services   On track    CARE PLAN ENTRY (see longitudinal plan of care for additional care plan information)  Current Barriers & Progress:   . Patient with chronic conditions needs Support, Education, and Care Coordination to resolve unmet Personal Care needs . PCS assessment completed April 23rd However no PCS services have started . Expressed ongoing concerns with PCS providers, Has located a new provider Clinical Social Work Goal(s):  Marland Kitchen Over the next 30 to 45 days, patient will have personal care needs met as evident by having PCS Aide in the home assisting with needs. . Over the next 30 days patient will work with LCSW, and [facility] to coordinate care for [facility] and select a personal care service provider  Interventions provided by LCSW : . Assessed needs and barriers, . Collaborated with [facility] . Solution focus and task centered interventions provided Patient Self Care Activities & Deficits:  . Patient is unable to perform ADLs independently without assistance  . Family/support system will assist patient with meeting needs until [name] is approved . Patient will call new provider to F/U for start date . Return calls from [facility] to set up initial PCS assessment once application is approved . Call [facility] with questions [phone] or [phone]  Please see past updates related to this goal by clicking on the "Past Updates" button in the selected goal      Tracy Rose received Care Management services today:  1. Care Management services include personalized support from designated clinical staff supervised by her physician, including individualized plan of care and coordination with other care  providers 2. 24/7 contact [phone] for assistance for urgent and routine care needs. 3. Care Management are voluntary services and be declined at any time by calling the office.  Patient verbalizes understanding of instructions provided today.  Follow up plan:  SW will follow up with patient by phone over the next 2 to 3 weeks  Maurine Cane, LCSW

## 2019-06-17 NOTE — Chronic Care Management (AMB) (Signed)
Care Management   Clinical Social Work Follow Up   06/17/2019 Name: Tracy Rose MRN: [id number] DOB: August 07, 1965  Referred by: Kinnie Feil, MD  Reason for referral : Care Coordination (PCS)  Tracy Rose is a 54 y.o. year old female who is a primary care patient of [place], Phill Myron, MD.  Reason for follow-up: Received phone call from patient reference [place].    Intervention: Continue to assist patient with removing barriers to services and care.  Review of patient status, including review of consultants reports, relevant laboratory and other test results, and collaboration with appropriate care team members and the patient's provider was performed as part of comprehensive patient evaluation and provision of care management services.    Advance Directive Status: N See Care Plan and [employer] application for related entries. SDOH (Social Determinants of Health) assessments performed: No   Goals Addressed            This Visit's Progress   . Personal Care Services   On track    CARE PLAN ENTRY (see longitudinal plan of care for additional care plan information)  Current Barriers & Progress:   . Patient with chronic conditions needs Support, Education, and Care Coordination to resolve unmet Personal Care needs . PCS assessment completed April 23rd However no PCS services have started . Expressed ongoing concerns with PCS providers, Has located a new provider Clinical Social Work Goal(s):  Marland Kitchen Over the next 30 to 45 days, patient will have personal care needs met as evident by having PCS Aide in the home assisting with needs. . Over the next 30 days patient will work with LCSW, and [facility] to coordinate care for [facility] and select a personal care service provider  Interventions provided by LCSW : . Assessed needs and barriers, . Collaborated with [facility] . Solution focus and task centered interventions provided Patient Self Care Activities &  Deficits:  . Patient is unable to perform ADLs independently without assistance  . Family/support system will assist patient with meeting needs until [facility] is approved . Patient will call new provider to F/U for start date . Return calls from [facility] to set up initial PCS assessment once application is approved . Call [facility] with questions [phone] or [phone]  Please see past updates related to this goal by clicking on the "Past Updates" button in the selected goal        Outpatient Encounter Medications as of 06/16/2019  Medication Sig  . acyclovir (ZOVIRAX) 400 MG tablet TAKE 1 TABLET(400 MG) BY MOUTH TWICE DAILY (Patient taking differently: Take 400 mg by mouth 2 (two) times daily as needed (for herpes). )  . amLODipine (NORVASC) 10 MG tablet Take 1 tablet (10 mg total) by mouth daily.  . Baclofen 5 MG TABS Take 5 mg by mouth 2 (two) times daily.  Marland Kitchen buPROPion (WELLBUTRIN SR) 100 MG 12 hr tablet Take 100 mg by mouth daily.  . clindamycin (CLEOCIN) 150 MG capsule Take 2 capsules (300 mg total) by mouth 3 (three) times daily. May dispense as 124m capsules  . Cyanocobalamin (VITAMIN B-12) 5000 MCG TBDP Take 10,000 mcg by mouth daily.  . diphenhydrAMINE (BENADRYL) 25 mg capsule Take 1 capsule (25 mg total) by mouth every 6 (six) hours as needed.  . gabapentin (NEURONTIN) 100 MG capsule TAKE 1 CAPSULE(100 MG) BY MOUTH THREE TIMES DAILY  . hydrochlorothiazide (HYDRODIURIL) 12.5 MG tablet Take 1 tablet (12.5 mg total) by mouth  daily.  . Iron-Vitamins (GERITOL) LIQD Take 5 mLs by mouth 2 (two) times daily.  Marland Kitchen lamoTRIgine (LAMICTAL) 25 MG tablet Take 50 mg by mouth 2 (two) times daily.  . nabumetone (RELAFEN) 750 MG tablet Take 750 mg by mouth daily.  . prazosin (MINIPRESS) 1 MG capsule Take 1 mg by mouth at bedtime.   No facility-administered encounter medications on file as of 06/16/2019.   Plan: F/U with patient in two weeks  Casimer Lanius, [place] / [place]   [phone] 3:03 PM

## 2019-06-18 ENCOUNTER — Ambulatory Visit (HOSPITAL_COMMUNITY)
Admission: EM | Admit: 2019-06-18 | Discharge: 2019-06-18 | Disposition: A | Discharge status: Home / Self Care | Payer: Medicaid Other | Attending: Family Medicine | Admitting: Family Medicine

## 2019-06-18 ENCOUNTER — Other Ambulatory Visit: Payer: Self-pay

## 2019-06-18 ENCOUNTER — Encounter (HOSPITAL_COMMUNITY): Payer: Self-pay

## 2019-06-18 DIAGNOSIS — H9201 Otalgia, right ear: Secondary | ICD-10-CM | POA: Diagnosis not present

## 2019-06-18 DIAGNOSIS — R42 Dizziness and giddiness: Secondary | ICD-10-CM | POA: Diagnosis not present

## 2019-06-18 DIAGNOSIS — H65191 Other acute nonsuppurative otitis media, right ear: Secondary | ICD-10-CM

## 2019-06-18 DIAGNOSIS — R519 Headache, unspecified: Secondary | ICD-10-CM

## 2019-06-18 DIAGNOSIS — I10 Essential (primary) hypertension: Secondary | ICD-10-CM

## 2019-06-18 MED ORDER — AMLODIPINE BESYLATE 10 MG PO TABS: 10.0000 mg | ORAL_TABLET | Freq: Every day | ORAL | 1 refills | Status: AC

## 2019-06-18 MED ORDER — PRAZOSIN HCL 1 MG PO CAPS: 1.0000 mg | ORAL_CAPSULE | Freq: Every day | ORAL | 1 refills | Status: AC

## 2019-06-18 MED ORDER — HYDROCHLOROTHIAZIDE 12.5 MG PO TABS: 12.5000 mg | ORAL_TABLET | Freq: Every day | ORAL | 1 refills | Status: AC

## 2019-06-18 MED ORDER — MECLIZINE HCL 25 MG PO TABS: 25.0000 mg | ORAL_TABLET | Freq: Three times a day (TID) | ORAL | 0 refills | Status: AC | PRN

## 2019-06-18 MED ORDER — FLUTICASONE PROPIONATE 50 MCG/ACT NA SUSP: 2.0000 | Freq: Every day | NASAL | 1 refills | Status: AC

## 2019-06-18 NOTE — ED Provider Notes (Signed)
[place]    CSN: [id number] Arrival date & time: 06/18/19  1823      History   Chief Complaint Chief Complaint  Patient presents with  . Otalgia  . Dizziness  . Blurred Vision    HPI Tracy Rose is a 54 y.o. female.   54 year old female present with both ears ringing, bilateral ear pain, dizziness and headaches for the past 2 days. Also seeing "spots". Decided to rest today and when she woke up from her nap, she noticed blood coming out of her right ear canal. She denies any fever, nasal congestion, cough or GI symptoms. She was in the [place] last week and when she returned earlier this week, she noticed her ears were more "Clogged" and felt "full". She also has a history of HTN, hyperlipidemia, tobacco abuse, anemia, anxiety and bipolar disorder. She was hospitalized in early April due to Cellulitis and possible reaction to Lamictal and she decided to stop all of her medication. She has not taken her blood pressure medication in over a month and requests to restart her BP meds today. She denies any chest pain, difficulty breathing, but does have headaches, dizziness and irregular vision. She was on Norvasc, HCTZ, Lamictal, Wellbutrin, Gabapentin, and Prazosin daily but has not taken any of these since April 28, 2019.   The history is provided by the patient.    Past Medical History:  Diagnosis Date  . Anemia   . Anxiety state    Panic Attacks  . Bipolar disorder ([place])   . Cervical strain, acute, subsequent encounter 05/29/2016  . Concussion 10/13/2017  . Essential hypertension, benign 06/10/2015  . Genital herpes 12/12/2011  . GERD (gastroesophageal reflux disease) 08/27/2017  . GOITER, MULTINODULAR 09/05/2007   Dr. Zada Girt [facility]  [place] 2011: Stable to slightly smaller bilateral thyroid nodules since 09/04/2007. No new or enlarging thyroid nodules identified. Biopsy 08/2007: non neoplastic goiter   . H/O ETOH abuse    Sober since 06/03/1992  .  Headache 03/19/2014  . Hyperlipidemia 05/18/2016   Diet controlled, no meds  . Mastalgia in female 06/10/2015  . Mild depression ([place]) 12/15/2013  . OBESITY, NOS 03/21/2006       . Post concussion syndrome 09/10/2013  . PTSD (post-traumatic stress disorder)   . S/P vaginal hysterectomy 01/29/2017  . SVD (spontaneous vaginal delivery)    x 1  . Tobacco abuse 09/08/2010  . Tobacco abuse     Patient Active Problem List   Diagnosis Date Noted  . Obesity (BMI 35.0-39.9 without comorbidity) 05/12/2019  . Dyspnea 05/12/2019  . Rash 04/30/2019  . Thyroid nodule greater than or equal to 1.5 cm in diameter incidentally noted on imaging study 04/30/2019  . Incidental lung nodule, > 61mm and < 77mm 04/30/2019  . Cellulitis 04/30/2019  . Lumbar back pain with radiculopathy affecting left lower extremity 10/17/2017  . GERD (gastroesophageal reflux disease) 08/27/2017  . Hyperlipidemia 05/18/2016  . Breast mass, left 10/28/2015  . 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 Severe manic bipolar 1 disorder with psychotic behavior ([place]) 08/05/2015  . Essential hypertension, benign 06/10/2015  . ADHD (attention deficit hyperactivity disorder) 08/15/2014  . Menorrhagia 08/19/2013  . Macromastia 04/07/2013  . Genital herpes 12/12/2011  . Tobacco abuse 09/08/2010  . GOITER, MULTINODULAR 09/05/2007    Past Surgical History:  Procedure Laterality Date  . BREAST BIOPSY Left 06/22/2015  . BREAST EXCISIONAL BIOPSY Left 09/21/2015  . BREAST LUMPECTOMY WITH  RADIOACTIVE SEED LOCALIZATION Left 09/21/2015   Procedure: LEFT BREAST LUMPECTOMY WITH RADIOACTIVE SEED LOCALIZATION;  Surgeon: Donnie Mesa, MD;  Location: [facility];  Service: General;  Laterality: Left;  . CERVICAL CERCLAGE     x 1  . RADIOLOGY WITH ANESTHESIA N/A 01/26/2014   Procedure: MRA NECK, MRI BRAIN WITH AND WITHOUT CONTRAST ;  Surgeon: Medication Radiologist, MD;  Location: [facility];  Service: Radiology;   Laterality: N/A;  . RADIOLOGY WITH ANESTHESIA N/A 12/17/2017   Procedure: MRI OF BRAIN AND CERVICAL SPINE WITHOUT CONTRAST;  Surgeon: Radiologist, Medication, MD;  Location: [facility];  Service: Radiology;  Laterality: N/A;  . VAGINAL HYSTERECTOMY Bilateral 01/29/2017   Procedure: HYSTERECTOMY VAGINAL WITH SALPINGECTOMY;  Surgeon: Donnamae Jude, MD;  Location: [facility];  Service: Gynecology;  Laterality: Bilateral;  . WISDOM TOOTH EXTRACTION     @ age 53    OB History    Gravida  4   Para  2   Term  2   Preterm  0   AB  2   Living  1     SAB  0   TAB  2   Ectopic  0   Multiple  0   Live Births               Home Medications    Prior to Admission medications   Medication Sig Start Date End Date Taking? Authorizing Provider  amLODipine (NORVASC) 10 MG tablet Take 1 tablet (10 mg total) by mouth daily. 06/18/19   Katy Apo, NP  Cyanocobalamin (VITAMIN B-12) 5000 MCG TBDP Take 10,000 mcg by mouth daily.    [provider]  fluticasone (FLONASE) 50 MCG/ACT nasal spray Place 2 sprays into both nostrils daily. 06/18/19   Katy Apo, NP  gabapentin (NEURONTIN) 100 MG capsule TAKE 1 CAPSULE(100 MG) BY MOUTH THREE TIMES DAILY 04/22/19   Kinnie Feil, MD  hydrochlorothiazide (HYDRODIURIL) 12.5 MG tablet Take 1 tablet (12.5 mg total) by mouth daily. 06/18/19   Katy Apo, NP  Iron-Vitamins (GERITOL) LIQD Take 5 mLs by mouth 2 (two) times daily.    [provider]  lamoTRIgine (LAMICTAL) 25 MG tablet Take 50 mg by mouth 2 (two) times daily. 11/05/17   [provider]  meclizine (ANTIVERT) 25 MG tablet Take 1 tablet (25 mg total) by mouth 3 (three) times daily as needed for dizziness. 06/18/19   Katy Apo, NP  nabumetone (RELAFEN) 750 MG tablet Take 750 mg by mouth daily.    [provider]  prazosin (MINIPRESS) 1 MG capsule Take 1 capsule (1 mg total) by mouth at bedtime. 06/18/19   Katy Apo, NP  diphenhydrAMINE  (BENADRYL) 25 mg capsule Take 1 capsule (25 mg total) by mouth every 6 (six) hours as needed. 04/30/19 06/18/19  Daisy Floro, DO    Family History Family History  Problem Relation Age of Onset  . Arthritis Mother   . Diabetes Father   . Arthritis Father   . Hypertension Father   . Hyperlipidemia Father   . Heart disease Father   . Stroke Father   . Cancer Maternal Grandfather   . Cancer Sister   . Breast cancer Sister 44  . Breast cancer Paternal Aunt   . Breast cancer Maternal Grandmother   . Migraines Neg Hx     Social History Social History   Tobacco Use  . Smoking status: Current Every Day Smoker    Packs/day: 0.13  Years: 25.00    Pack years: 3.25    Types: Cigarettes    Last attempt to quit: 10/08/2017    Years since quitting: 1.6  . Smokeless tobacco: Never Used  Substance Use Topics  . Alcohol use: No    Alcohol/week: 0.0 standard drinks    Comment: Sober since 06/03/1992  . Drug use: No     Allergies   Nsaids and Dilaudid [hydromorphone hcl]   Review of Systems Review of Systems  Constitutional: Positive for fatigue. Negative for activity change, appetite change, chills, diaphoresis and fever.  HENT: Positive for ear discharge, ear pain and tinnitus. Negative for congestion, facial swelling, mouth sores, nosebleeds, postnasal drip, rhinorrhea, sinus pressure, sinus pain, sore throat and trouble swallowing.   Eyes: Positive for visual disturbance. Negative for photophobia, pain, discharge, redness and itching.  Respiratory: Negative for cough, chest tightness, shortness of breath and wheezing.   Cardiovascular: Negative for chest pain and palpitations.  Gastrointestinal: Positive for nausea. Negative for vomiting.  Musculoskeletal: Positive for neck pain. Negative for neck stiffness.  Skin: Negative for color change, rash and wound.  Allergic/Immunologic: Negative for environmental allergies and immunocompromised state.  Neurological: Positive for  dizziness, light-headedness and headaches. Negative for tremors, seizures, syncope, facial asymmetry, speech difficulty, weakness and numbness.  Hematological: Negative for adenopathy. Does not bruise/bleed easily.  Psychiatric/Behavioral: Positive for dysphoric mood.     Physical Exam Triage Vital Signs ED Triage Vitals  Enc Vitals Group     BP 06/18/19 1835 (!) 149/101     Pulse Rate 06/18/19 1835 82     Resp 06/18/19 1835 19     Temp 06/18/19 1835 98.8 F (37.1 C)     Temp Source 06/18/19 1835 Oral     SpO2 06/18/19 1835 100 %     Weight --      Height --      Head Circumference --      Peak Flow --      Pain Score 06/18/19 1837 7     Pain Loc --      Pain Edu? --      Excl. in [place]? --    No data found.  Updated Vital Signs BP (!) 149/101 (BP Location: Right Arm)   Pulse 82   Temp 98.8 F (37.1 C) (Oral)   Resp 19   LMP  (LMP Unknown)   SpO2 100%   Visual Acuity Right Eye Distance:   Left Eye Distance:   Bilateral Distance:    Right Eye Near:   Left Eye Near:    Bilateral Near:     Physical Exam Vitals and nursing note reviewed.  Constitutional:      General: She is awake. She is not in acute distress.    Appearance: She is well-developed and overweight.     Comments: Patient sitting comfortably in exam chair in no acute distress but appears to be in pain and has difficulty changing positions due to dizziness.   HENT:     Head: Normocephalic and atraumatic.     Right Ear: Hearing and external ear normal. Tenderness present. No drainage or swelling. A middle ear effusion is present. There is no impacted cerumen. No foreign body. Tympanic membrane is bulging. Tympanic membrane is not injected, perforated or erythematous.     Left Ear: Hearing and external ear normal. No drainage, swelling or tenderness.  No middle ear effusion. There is no impacted cerumen. No foreign body. Tympanic membrane is bulging. Tympanic membrane is  not injected, perforated or  erythematous.     Ears:     Comments: Right TM- no redness but fluid present behind TM with a few bubbles seen. No drainage or perforated TM. No drainage, redness or swelling seen of her ear canal. Did not observe any dried blood or active bleeding anywhere around her ear canal or external ear. No foreign bodies. Ear canal tender.  Left TM also with slight fluid but no redness, tenderness or discharge.      Nose: No congestion or rhinorrhea.     Right Sinus: Maxillary sinus tenderness and frontal sinus tenderness present.     Left Sinus: No maxillary sinus tenderness or frontal sinus tenderness.     Comments: Tenderness present at frontal and maxillary sinuses on right side only.     Mouth/Throat:     Lips: Pink.     Mouth: Mucous membranes are moist.     Pharynx: Oropharynx is clear. Uvula midline. No pharyngeal swelling, oropharyngeal exudate, posterior oropharyngeal erythema or uvula swelling.  Eyes:     General: Lids are normal. Vision grossly intact. No visual field deficit.    Extraocular Movements: Extraocular movements intact.     Conjunctiva/sclera: Conjunctivae normal.     Right eye: Right conjunctiva is not injected.     Left eye: Left conjunctiva is not injected.     Pupils: Pupils are equal, round, and reactive to light.  Neck:     Comments: Also slight post auricular lymph node tenderness but no swelling present.  Cardiovascular:     Rate and Rhythm: Normal rate and regular rhythm.     Heart sounds: Normal heart sounds. No murmur.  Pulmonary:     Effort: Pulmonary effort is normal. No accessory muscle usage, respiratory distress or retractions.     Breath sounds: Normal air entry. No decreased air movement. No decreased breath sounds, wheezing, rhonchi or rales.  Musculoskeletal:        General: Normal range of motion.     Cervical back: Normal range of motion and neck supple. Tenderness present. No rigidity.  Lymphadenopathy:     Cervical: Cervical adenopathy present.       Right cervical: Superficial cervical adenopathy present.  Skin:    General: Skin is warm and dry.     Capillary Refill: Capillary refill takes less than 2 seconds.     Findings: No rash.  Neurological:     Mental Status: She is alert and oriented to person, place, and time.     Cranial Nerves: Cranial nerves are intact. No facial asymmetry.     Sensory: Sensation is intact.     Gait: Gait is intact.     Comments: She is slightly unstable with her gait when walking but otherwise no other abnormalities detected.   Psychiatric:        Attention and Perception: Attention normal.        Mood and Affect: Mood normal.        Speech: Speech normal.        Behavior: Behavior is slowed. Behavior is cooperative.      UC Treatments / Results  Labs (all labs ordered are listed, but only abnormal results are displayed) Labs Reviewed - No data to display  EKG   Radiology No results found.  Procedures Procedures (including critical care time)  Medications Ordered in UC Medications - No data to display  Initial Impression / Assessment and Plan / UC Course  I have reviewed the triage vital  signs and the nursing notes.  Pertinent labs & imaging results that were available during my care of the patient were reviewed by me and considered in my medical decision making (see chart for details).    Reviewed with patient that she appears to have fluid behind her TM which can cause pain, decreased hearing and dizziness. Discussed that no distinct signs of bacterial infection so no definite need for antibiotics at this time. Did not see any blood so uncertain if she had any bleeding or was delusional given psychiatric history and off medications. Will trial Flonase 2 sprays each nostril twice a day and Meclizine 25mg  every 8 hours as needed for dizziness. May take Tylenol 1000mg  every 8 hours as needed for pain. Discussed that blood pressure medication that she was previously on should not have  caused the reaction she had at the hospital so would advise to restart. Recommend restart Norvasc 10mg  once daily, HCTZ 12.5mg  daily in AM and Minipress 1mg  daily at bedtime. Continue to monitor BP. Would discuss with her PCP and Psychiatry about restarting medication for her bipolar disorder and other psychiatric issues. If headache, dizziness and vision changes persist or any chest pain, difficulty breathing or numbness occurs, call 911 and go to the ER ASAP. Otherwise, follow-up with her PCP and Specialists as planned.  Final Clinical Impressions(s) / UC Diagnoses   Final diagnoses:  Acute pain of right ear  Acute middle ear effusion, right  Dizziness  Sinus headache  Elevated blood pressure reading in office with diagnosis of hypertension     Discharge Instructions     Recommend start Meclizine 25mg  every 8 hours as needed for dizziness. Also take Flonase 2 sprays each nostril once daily. Take Tylenol 1000mg  every 8 hours as needed for pain. Restart blood pressure medication- take Amlodipine 10mg  daily, HCTZ 12.5mg  daily in AM and Prazolin 1mg  daily at bedtime. Continue to monitor your blood pressure. If dizziness, headache and blurred vision do not improve by tomorrow or if they worsen, call 911 and go to the ER ASAP. Otherwise, follow-up with your PCP in 3 to 4 days for recheck.     ED Prescriptions    Medication Sig Dispense Auth. Provider   amLODipine (NORVASC) 10 MG tablet Take 1 tablet (10 mg total) by mouth daily. 30 tablet Katy Apo, NP   hydrochlorothiazide (HYDRODIURIL) 12.5 MG tablet Take 1 tablet (12.5 mg total) by mouth daily. 30 tablet Katy Apo, NP   prazosin (MINIPRESS) 1 MG capsule Take 1 capsule (1 mg total) by mouth at bedtime. 30 capsule Katy Apo, NP   meclizine (ANTIVERT) 25 MG tablet Take 1 tablet (25 mg total) by mouth 3 (three) times daily as needed for dizziness. 30 tablet Katy Apo, NP   fluticasone (FLONASE) 50 MCG/ACT nasal spray  Place 2 sprays into both nostrils daily. 16 g Katy Apo, NP     PDMP not reviewed this encounter.   Katy Apo, NP 06/19/19 1156

## 2019-06-18 NOTE — ED Triage Notes (Signed)
Pt reports blurred vision, headache, dizziness, bilateral ear pain, bilateral tinnitus and bleeding on the left ear x 1 day. Pt took her blood pressure medication for the last time over a month ago.

## 2019-06-18 NOTE — ED Notes (Signed)
BP reported to NP Amyot.

## 2019-06-18 NOTE — Discharge Instructions (Addendum)
Recommend start Meclizine 25mg  every 8 hours as needed for dizziness. Also take Flonase 2 sprays each nostril once daily. Take Tylenol 1000mg  every 8 hours as needed for pain. Restart blood pressure medication- take Amlodipine 10mg  daily, HCTZ 12.5mg  daily in AM and Prazolin 1mg  daily at bedtime. Continue to monitor your blood pressure. If dizziness, headache and blurred vision do not improve by tomorrow or if they worsen, call 911 and go to the ER ASAP. Otherwise, follow-up with your PCP in 3 to 4 days for recheck.

## 2019-07-01 ENCOUNTER — Ambulatory Visit: Payer: Self-pay | Admitting: Licensed Clinical Social Worker

## 2019-07-01 ENCOUNTER — Other Ambulatory Visit: Payer: Self-pay

## 2019-07-01 ENCOUNTER — Telehealth: Payer: Medicaid Other

## 2019-07-01 NOTE — Chronic Care Management (AMB) (Signed)
    Clinical Social Work  Care Management Outreach   07/01/2019 Name: Tracy Rose MRN: [id number] DOB: 1965/10/10 Tracy Rose is a 54 y.o. year old female who is a primary care patient of Kinnie Feil, MD .  The Care Management team was consulted for assistance with Level of Care Concerns for personal care services. Patient has experienced many barriers with establishing PCS. F/U to see how service is going.  LCSW reached out to [name] today by phone for update on PCS services.  Phone not in services.  Called patient's daughter. Report's patient has a new phone number.  A HIPPA compliant phone message was left for the patient providing contact information and requesting a return call.   Follow Up Plan: LCSW will wait for return call.  If no return call is received LCSW will discontinue outreach as current phone number is no longer valid.   Review of patient status, including review of consultants reports, relevant laboratory and other test results, and collaboration with appropriate care team members and the patient's provider was performed as part of comprehensive patient evaluation and provision of care management services.    Casimer Lanius, [place] / [place]   [phone] 2:30 PM

## 2019-07-08 ENCOUNTER — Ambulatory Visit: Payer: Self-pay | Admitting: Licensed Clinical Social Worker

## 2019-07-08 NOTE — Chronic Care Management (AMB) (Signed)
   Social Work  Care Management 07/08/2019 Name: Tracy Rose MRN: [id number] DOB: May 07, 1965 Tracy Rose is a 54 y.o. year old female who sees Kinnie Feil, MD for primary care. LCSW was consulted to assistance patient with  Level of Care Concernsf or Personal Care Services  LCSW received voice message that [facility] services have started and are working out well for her.  Also report she does not have a phone at time but wanted to provide this update Plan: No further follow up required by LCSW at this time. Patient will contact office for future needs  Review of patient status, including review of consultants reports, relevant laboratory and other test results, and collaboration with appropriate care team members and the patient's provider was performed as part of comprehensive patient evaluation and provision of chronic care management services.    Goals Addressed            This Visit's Progress   . COMPLETED: Personal Care Services       CARE PLAN ENTRY (see longitudinal plan of care for additional care plan information)  Current Barriers & Progress:   . Patient with chronic conditions needs Support, Education, and Care Coordination to resolve unmet Personal Care needs . PCS services are in place Clinical Social Work Goal(s):  Marland Kitchen Over the next 30 to 45 days, patient will have personal care needs met as evident by having PCS Aide in the home assisting with needs. Interventions provided by LCSW : . Assessed needs and barriers, . Collaborated with [facility] . Solution focus and task centered interventions provided Patient Self Care Activities & Deficits:  . Patient is unable to perform ADLs independently without assistance  . Family/support system will assist patient with meeting needs until [facility] is approved . Patient will call new provider to F/U for start date . Return calls from [facility] to set up initial PCS assessment once application is  approved . Call [facility] with questions [phone] or [phone]  Please see past updates related to this goal by clicking on the "Past Updates" button in the selected goal        Casimer Lanius, [place] / [place]   [phone] 2:42 PM

## 2019-07-16 ENCOUNTER — Institutional Professional Consult (permissible substitution): Payer: Medicaid Other | Admitting: Pulmonary Disease

## 2019-09-09 ENCOUNTER — Telehealth: Payer: Self-pay | Admitting: Family Medicine

## 2019-09-09 NOTE — Telephone Encounter (Signed)
Patient stopped by needs refill of meds name is Meclizine 25 mg.  She states for Vertigo.  Her pharmacy is [facility] on [address].  Any questions call patient [phone]

## 2019-09-17 NOTE — Telephone Encounter (Signed)
Attempted to reach pt to see which appt she wanted to keep either the 09/22/2019 10:10 or 09/25/2019 9:10? No answer lvm  Salvatore Marvel, CMA

## 2019-09-17 NOTE — Telephone Encounter (Signed)
-----   Message from Bobbye Riggs, [place] sent at 09/17/2019  9:38 AM EDT ----- I left a message on VM asking her to call back and let us know which one she will come to.  ----- Message ----- From: Kinnie Feil, MD Sent: 09/17/2019   9:12 AM EDT To: Laban Emperor Fleeger, CMA, Alvera Singh, #  Morning ladies,  This patient has two appointments for different days the same week for same reason, please help contact her to adjust her appointment to avoid confusion. Thanks.   [place].

## 2019-09-21 ENCOUNTER — Encounter: Payer: Self-pay | Admitting: Family Medicine

## 2019-09-22 ENCOUNTER — Ambulatory Visit: Payer: Medicaid Other | Admitting: Family Medicine

## 2019-09-25 ENCOUNTER — Ambulatory Visit: Payer: Medicaid Other | Admitting: Family Medicine

## 2019-10-16 ENCOUNTER — Other Ambulatory Visit: Payer: Self-pay | Admitting: Family Medicine

## 2019-10-16 ENCOUNTER — Other Ambulatory Visit: Payer: Self-pay

## 2019-10-16 ENCOUNTER — Ambulatory Visit (INDEPENDENT_AMBULATORY_CARE_PROVIDER_SITE_OTHER): Payer: Medicaid Other | Admitting: Family Medicine

## 2019-10-16 ENCOUNTER — Encounter: Payer: Self-pay | Admitting: Family Medicine

## 2019-10-16 ENCOUNTER — Other Ambulatory Visit (HOSPITAL_COMMUNITY)
Admission: RE | Admit: 2019-10-16 | Discharge: 2019-10-16 | Disposition: A | Discharge status: Home / Self Care | Payer: Medicaid Other | Source: Ambulatory Visit | Attending: Family Medicine | Admitting: Family Medicine

## 2019-10-16 VITALS — BP 128/80 | HR 92 | Ht 66.0 in | Wt 238.0 lb

## 2019-10-16 DIAGNOSIS — F312 Bipolar disorder, current episode manic severe with psychotic features: Secondary | ICD-10-CM

## 2019-10-16 DIAGNOSIS — Z01419 Encounter for gynecological examination (general) (routine) without abnormal findings: Secondary | ICD-10-CM

## 2019-10-16 DIAGNOSIS — I10 Essential (primary) hypertension: Secondary | ICD-10-CM

## 2019-10-16 DIAGNOSIS — N289 Disorder of kidney and ureter, unspecified: Secondary | ICD-10-CM

## 2019-10-16 DIAGNOSIS — Z1231 Encounter for screening mammogram for malignant neoplasm of breast: Secondary | ICD-10-CM

## 2019-10-16 DIAGNOSIS — Z1211 Encounter for screening for malignant neoplasm of colon: Secondary | ICD-10-CM

## 2019-10-16 DIAGNOSIS — F333 Major depressive disorder, recurrent, severe with psychotic symptoms: Secondary | ICD-10-CM

## 2019-10-16 HISTORY — DX: Disorder of kidney and ureter, unspecified: N28.9

## 2019-10-16 NOTE — Progress Notes (Addendum)
SUBJECTIVE:   CHIEF COMPLAINT / HPI:   PAP: LMP: Jan/2018. Denies any GU concerns.  Renal impairment: recent creatinine level increased. No renal symptoms.  HTN: Off all meds.  Behavioral health issues: She stated that she is doing well in general. She took herself off all meds. She denies SI/HI. She stated that she will be moving to [facility] next month, she got a full scholarship at [employer] to study social justice. She stopped smoking.    PERTINENT  PMH / PSH: PMX reviewed.  OBJECTIVE:   BP 128/80   Pulse 92   Ht 5\' 6"  (1.676 m)   Wt 238 lb (108 kg)   LMP  (LMP Unknown)   SpO2 97%   BMI 38.41 kg/m   Vitals:   10/16/19 0958  BP: 128/80  Pulse: 92  SpO2: 97%  Weight: 238 lb (108 kg)  Height: 5\' 6"  (1.676 m)    Physical Exam Vitals and nursing note reviewed. Exam conducted with a chaperone present Erskine Emery ).  Constitutional:      Appearance: Normal appearance.  Cardiovascular:     Rate and Rhythm: Normal rate and regular rhythm.     Heart sounds: Normal heart sounds. No murmur heard.   Pulmonary:     Effort: Pulmonary effort is normal. No respiratory distress.     Breath sounds: Normal breath sounds. No wheezing or rhonchi.  Abdominal:     General: Abdomen is flat. Bowel sounds are normal. There is no distension.     Palpations: Abdomen is soft. There is no mass.     Tenderness: There is no abdominal tenderness.  Genitourinary:    Exam position: Supine.     Labia:        Right: No tenderness or lesion.        Left: No tenderness or lesion.      Vagina: Normal.     Adnexa: Right adnexa normal and left adnexa normal.     Comments: No cervix Musculoskeletal:     Right lower leg: No edema.     Left lower leg: No edema.  Neurological:     General: No focal deficit present.     Mental Status: She is alert and oriented to person, place, and time.  Psychiatric:        Behavior: Behavior normal.        Thought Content:  Thought content normal.        Judgment: Judgment normal.     Comments: Moderately high      ASSESSMENT/PLAN:  Gyn exam: No cervix on exam. She then stated that she had hysterectomy for fibroid. I reviewed her record and there was a pathology report from 01/2017 s/p hysterectomy. As discussed with her, she will no longer need to get PAP done. PAP completed today.   Impaired renal function Recent creatinine level checked at the ED was elevated. Unfortunately, this was not done during this visit. I called her to discuss. She will return to the lab for testing. Appointment made for her.  Essential hypertension, benign Looks good off med. Monitor for now.  Severe manic bipolar 1 disorder with psychotic behavior ([place]) Seems at her baseline of hyperactivity. Medication adherence discussed recently, but she prefers not to get back on her meds. She will follow with her Psychiatrist for further management   Office Visit from 10/16/2019 in [place]  PHQ-9 Total Score 0       MDD (  major depressive disorder), recurrent, severe, with psychosis ([place]) Seems at her baseline of hyperactivity. Medication adherence discussed recently, but she prefers not to get back on her meds. She will follow with her Psychiatrist for further management   Office Visit from 10/16/2019 in [place]  PHQ-9 Total Score 0        Colon cancer screening discussed. Colonoscopy vs cologuard. She opted for cologuard for now. She will schedule her mammogram. Mammogram ordered.   Tracy Mews, MD [place]

## 2019-10-16 NOTE — Assessment & Plan Note (Signed)
Looks good off med. Monitor for now.

## 2019-10-16 NOTE — Assessment & Plan Note (Addendum)
Seems at her baseline of hyperactivity. Medication adherence discussed recently, but she prefers not to get back on her meds. She will follow with her Psychiatrist for further management   Office Visit from 10/16/2019 in [place]  PHQ-9 Total Score 0

## 2019-10-16 NOTE — Patient Instructions (Signed)

## 2019-10-16 NOTE — Assessment & Plan Note (Addendum)
Seems at her baseline of hyperactivity. Medication adherence discussed recently, but she prefers not to get back on her meds. She will follow with her Psychiatrist for further management   Office Visit from 10/16/2019 in [place]  PHQ-9 Total Score 0

## 2019-10-16 NOTE — Assessment & Plan Note (Signed)
Recent creatinine level checked at the ED was elevated. Unfortunately, this was not done during this visit. I called her to discuss. She will return to the lab for testing. Appointment made for her.

## 2019-10-20 ENCOUNTER — Telehealth: Payer: Self-pay | Admitting: Family Medicine

## 2019-10-20 DIAGNOSIS — R911 Solitary pulmonary nodule: Secondary | ICD-10-CM

## 2019-10-20 LAB — CYTOLOGY - PAP
Adequacy: ABSENT
Comment: NEGATIVE
Diagnosis: NEGATIVE
High risk HPV: NEGATIVE

## 2019-10-20 NOTE — Telephone Encounter (Signed)
Normal PAP discussed with her.  I reminded her that she will be due for repeat CT chest for lung nodules in Oct.  CT ordered and staff will contact her to schedule. No further questions from her at this point.

## 2019-10-20 NOTE — Telephone Encounter (Signed)
Scheduled patient for Ct chest at [facility] on October 13 @ 3:30pm.  Patient needs to have nothing to eat after 11am and can only have liquids 4 hours prior.  Attempted to call patient but voicemail was full.  She has a lab appt tomorrow and will try to catch her then to let her know of appt.  Garald Rhew,CMA

## 2019-10-20 NOTE — Telephone Encounter (Signed)
Since she does not have a cervix, we can d/c routine PAP for now.

## 2019-10-20 NOTE — Telephone Encounter (Signed)
No authorization needed for CT scan per medicaid.  Pap has already been processed and unable to discontinue.  Kayon Dozier,CMA

## 2019-10-21 ENCOUNTER — Other Ambulatory Visit: Payer: Medicaid Other

## 2019-10-27 ENCOUNTER — Other Ambulatory Visit: Payer: Medicaid Other

## 2019-11-04 ENCOUNTER — Ambulatory Visit (HOSPITAL_COMMUNITY)
Admission: RE | Admit: 2019-11-04 | Discharge: 2019-11-04 | Disposition: A | Discharge status: Home / Self Care | Payer: Medicaid Other | Source: Ambulatory Visit | Attending: Family Medicine | Admitting: Family Medicine

## 2019-11-04 ENCOUNTER — Other Ambulatory Visit: Payer: Self-pay

## 2019-11-04 ENCOUNTER — Other Ambulatory Visit: Payer: Medicaid Other

## 2019-11-04 DIAGNOSIS — R911 Solitary pulmonary nodule: Secondary | ICD-10-CM

## 2019-11-04 MED ORDER — IOHEXOL 300 MG/ML  SOLN
75.0000 mL | Freq: Once | INTRAMUSCULAR | Status: AC | PRN
  Administered 2019-11-04: 75 mL via INTRAVENOUS

## 2019-11-09 ENCOUNTER — Telehealth: Payer: Self-pay | Admitting: Family Medicine

## 2019-11-09 NOTE — Telephone Encounter (Signed)
CT lung result discussed with her.  Lung nodules resolved. No follow-up is recommended. She was excited.

## 2019-11-10 ENCOUNTER — Other Ambulatory Visit: Payer: Self-pay

## 2019-11-10 ENCOUNTER — Ambulatory Visit (INDEPENDENT_AMBULATORY_CARE_PROVIDER_SITE_OTHER): Payer: Medicaid Other | Admitting: Family Medicine

## 2019-11-10 ENCOUNTER — Encounter: Payer: Self-pay | Admitting: Family Medicine

## 2019-11-10 VITALS — BP 140/82 | HR 78 | Ht 66.0 in | Wt 252.0 lb

## 2019-11-10 DIAGNOSIS — Z1211 Encounter for screening for malignant neoplasm of colon: Secondary | ICD-10-CM | POA: Diagnosis not present

## 2019-11-10 DIAGNOSIS — N289 Disorder of kidney and ureter, unspecified: Secondary | ICD-10-CM

## 2019-11-10 DIAGNOSIS — R7303 Prediabetes: Secondary | ICD-10-CM

## 2019-11-10 DIAGNOSIS — E785 Hyperlipidemia, unspecified: Secondary | ICD-10-CM | POA: Diagnosis not present

## 2019-11-10 DIAGNOSIS — R7309 Other abnormal glucose: Secondary | ICD-10-CM

## 2019-11-10 DIAGNOSIS — E1169 Type 2 diabetes mellitus with other specified complication: Secondary | ICD-10-CM | POA: Diagnosis not present

## 2019-11-10 DIAGNOSIS — Z0000 Encounter for general adult medical examination without abnormal findings: Secondary | ICD-10-CM | POA: Diagnosis not present

## 2019-11-10 LAB — POCT GLYCOSYLATED HEMOGLOBIN (HGB A1C): HbA1c, POC (controlled diabetic range): 6 % (ref 0.0–7.0)

## 2019-11-10 NOTE — Patient Instructions (Signed)

## 2019-11-10 NOTE — Progress Notes (Signed)
Subjective:     Tracy Rose is a 54 y.o. female and is here for a comprehensive physical exam. The patient reports problems - overheated likely from menopause. Hx of DM in her family. No DM symptoms. She also mentioned that she was unable to complete her cologuard stool collection process and prefers to be referred back to GI for her colon cancer screen.  Social History   Socioeconomic History  . Marital status: Divorced    Spouse name: Not on file  . Number of children: 1  . Years of education: Assoc.  . Highest education level: Not on file  Occupational History  . Occupation: Recruitment consultant: [employer]  Tobacco Use  . Smoking status: Current Every Day Smoker    Packs/day: 0.13    Years: 25.00    Pack years: 3.25    Types: Cigarettes    Last attempt to quit: 10/08/2017    Years since quitting: 2.0  . Smokeless tobacco: Never Used  Vaping Use  . Vaping Use: Never used  Substance and Sexual Activity  . Alcohol use: No    Alcohol/week: 0.0 standard drinks    Comment: Sober since 06/03/1992  . Drug use: No  . Sexual activity: Not Currently    Birth control/protection: Surgical  Other Topics Concern  . Not on file  Social History Narrative   Unemployed.  Associates Degree.  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   Caffeine Use: none   Social Determinants of Health   Financial Resource Strain:   . Difficulty of Paying Living Expenses: Not on file  Food Insecurity:   . Worried About Charity fundraiser in the Last Year: Not on file  . Ran Out of Food in the Last Year: Not on file  Transportation Needs:   . Lack of Transportation (Medical): Not on file  . Lack of Transportation (Non-Medical): Not on file  Physical Activity:   . Days of Exercise per Week: Not on file  . Minutes of Exercise per Session: Not on file  Stress:   . Feeling of Stress : Not on file  Social Connections:   . Frequency of  Communication with Friends and Family: Not on file  . Frequency of Social Gatherings with Friends and Family: Not on file  . Attends Religious Services: Not on file  . Active Member of Clubs or Organizations: Not on file  . Attends Archivist Meetings: Not on file  . Marital Status: Not on file  Intimate Partner Violence:   . Fear of Current or Ex-Partner: Not on file  . Emotionally Abused: Not on file  . Physically Abused: Not on file  . Sexually Abused: Not on file   Health Maintenance  Topic Date Due  . COVID-19 Vaccine (1) Never done  . COLONOSCOPY  Never done  . INFLUENZA VACCINE  04/21/2020 (Originally 08/23/2019)  . MAMMOGRAM  05/24/2020  . TETANUS/TDAP  10/04/2027  . Hepatitis C Screening  Completed  . HIV Screening  Completed    The following portions of the patient's history were reviewed and updated as appropriate: allergies, current medications, past family history, past medical history, past social history, past surgical history and problem list.  Review of Systems Pertinent items noted in HPI and remainder of comprehensive ROS otherwise negative.   Objective:    BP 140/82   Pulse 78   Ht 5\' 6"  (1.676 m)   Wt  252 lb (114.3 kg)   LMP  (LMP Unknown)   SpO2 98%   BMI 40.67 kg/m  General appearance: alert and cooperative Head: Normocephalic, without obvious abnormality, atraumatic Ears/Eyes: conjunctivae/corneas clear. PERRL, EOM's intact. Fundi benign. Normal TM. Lungs: clear to auscultation bilaterally Heart: regular rate and rhythm, S1, S2 normal, no murmur, click, rub or gallop Abdomen: soft, non-tender; bowel sounds normal; no masses,  no organomegaly Extremities: extremities normal, atraumatic, no cyanosis or edema Skin: Skin color, texture, turgor normal. No rashes or lesions Neurologic: Alert and oriented X 3, normal strength and tone. Normal symmetric reflexes. Normal coordination and gait    Assessment:    Healthy female exam.  Plan:     Normal exam Weight discussed. She will work on her diet and exercise. She also requested nutritionist's support. Referral placed and I gave her Dr. Jenne Campus number to call for an appointment. Also got labs today for her kidney functions and Cholesterol.  A1C in the prediabetic range. Counseling provided. I discussed her CT lungs report with her and printed her a copy during this visit. GI referral placed for colon cancer screen. See After Visit Summary for Counseling Recommendations

## 2019-11-11 ENCOUNTER — Telehealth: Payer: Self-pay | Admitting: Family Medicine

## 2019-11-11 LAB — LIPID PANEL
Chol/HDL Ratio: 3.4 ratio (ref 0.0–4.4)
Cholesterol, Total: 208 mg/dL — ABNORMAL HIGH (ref 100–199)
HDL: 62 mg/dL (ref >39)
LDL Chol Calc (NIH): 133 mg/dL — ABNORMAL HIGH (ref 0–99)
Triglycerides: 73 mg/dL (ref 0–149)
VLDL Cholesterol Cal: 13 mg/dL (ref 5–40)

## 2019-11-11 LAB — CMP14+EGFR
ALT: 19 IU/L (ref 0–32)
AST: 15 IU/L (ref 0–40)
Albumin/Globulin Ratio: 1.5 (ref 1.2–2.2)
Albumin: 4.3 g/dL (ref 3.8–4.9)
Alkaline Phosphatase: 83 IU/L (ref 44–121)
BUN/Creatinine Ratio: 16 (ref 9–23)
BUN: 15 mg/dL (ref 6–24)
Bilirubin Total: 0.2 mg/dL (ref 0.0–1.2)
CO2: 22 mmol/L (ref 20–29)
Calcium: 9.2 mg/dL (ref 8.7–10.2)
Chloride: 104 mmol/L (ref 96–106)
Creatinine, Ser: 0.91 mg/dL (ref 0.57–1.00)
GFR calc Af Amer: 84 mL/min/{1.73_m2} (ref >59)
GFR calc non Af Amer: 73 mL/min/{1.73_m2} (ref >59)
Globulin, Total: 2.8 g/dL (ref 1.5–4.5)
Glucose: 83 mg/dL (ref 65–99)
Potassium: 4.6 mmol/L (ref 3.5–5.2)
Sodium: 140 mmol/L (ref 134–144)
Total Protein: 7.1 g/dL (ref 6.0–8.5)

## 2019-11-11 NOTE — Telephone Encounter (Signed)
Result discussed.  Mild elevation of her LDL and Total Chol.  Lifestyle modification discussed.  Repeat lab in 1 year. She agreed with the plan.

## 2019-11-19 ENCOUNTER — Telehealth: Payer: Self-pay | Admitting: *Deleted

## 2019-11-19 NOTE — Telephone Encounter (Signed)
Received fax from [facility] that patient hadn't returned her test.  When I called patient to inquire about it,she said that there was an issue with the sample collection and had to throw the kit away.  She is agreeable to just having a colonoscopy done since it is a better test.  Will forward to MD to place referral for this.  Damarko Stitely,CMA

## 2019-11-19 NOTE — Telephone Encounter (Signed)
Patient didn't realize that.  It has already been sent to [facility] for them to contact her for an appointment. Kayline Sheer,CMA

## 2019-11-19 NOTE — Telephone Encounter (Signed)
Referral was placed during her last visit.

## 2019-12-23 ENCOUNTER — Telehealth: Payer: Self-pay

## 2019-12-23 NOTE — Telephone Encounter (Signed)
I have her an appt on 12/3. I put the forms back in your box. Salvatore Marvel, CMA

## 2019-12-23 NOTE — Telephone Encounter (Signed)
Clinical info completed on Medical Assessment form.  Place form in PCP's box for completion.  Salvatore Marvel, CMA

## 2019-12-23 NOTE — Telephone Encounter (Signed)
Medical Assessment College Admissions form dropped off for at front desk for completion.  Verified that patient section of form has been completed.  Last DOS/WCC with PCP was 11/10/19.  Placed form in team folder to be completed by clinical staff.  [employer]

## 2019-12-23 NOTE — Telephone Encounter (Signed)
Form returned back to Tracy Rose.  The names and the DOB on her vaccination record does not match. She also need to come in for TB screen. Please help her make an appointment.

## 2019-12-25 ENCOUNTER — Ambulatory Visit (INDEPENDENT_AMBULATORY_CARE_PROVIDER_SITE_OTHER): Payer: Medicaid Other | Admitting: Family Medicine

## 2019-12-25 ENCOUNTER — Other Ambulatory Visit: Payer: Self-pay

## 2019-12-25 ENCOUNTER — Encounter: Payer: Self-pay | Admitting: Family Medicine

## 2019-12-25 VITALS — BP 120/80 | HR 96 | Ht 66.0 in | Wt 267.6 lb

## 2019-12-25 DIAGNOSIS — I10 Essential (primary) hypertension: Secondary | ICD-10-CM

## 2019-12-25 DIAGNOSIS — Z008 Encounter for other general examination: Secondary | ICD-10-CM

## 2019-12-25 DIAGNOSIS — Z111 Encounter for screening for respiratory tuberculosis: Secondary | ICD-10-CM

## 2019-12-25 NOTE — Patient Instructions (Signed)
Colonoscopy, Adult, Care After This sheet gives you information about how to care for yourself after your procedure. Your doctor may also give you more specific instructions. If you have problems or questions, call your doctor. What can I expect after the procedure? After the procedure, it is common to have:  A small amount of blood in your poop (stool) for 24 hours.  Some gas.  Mild cramping or bloating in your belly (abdomen). Follow these instructions at home: Eating and drinking   Drink enough fluid to keep your pee (urine) pale yellow.  Follow instructions from your doctor about what you cannot eat or drink.  Return to your normal diet as told by your doctor. Avoid heavy or fried foods that are hard to digest. Activity  Rest as told by your doctor.  Do not sit for a long time without moving. Get up to take short walks every 1-2 hours. This is important. Ask for help if you feel weak or unsteady.  Return to your normal activities as told by your doctor. Ask your doctor what activities are safe for you. To help cramping and bloating:   Try walking around.  Put heat on your belly as told by your doctor. Use the heat source that your doctor recommends, such as a moist heat pack or a heating pad. ? Put a towel between your skin and the heat source. ? Leave the heat on for 20-30 minutes. ? Remove the heat if your skin turns bright red. This is very important if you are unable to feel pain, heat, or cold. You may have a greater risk of getting burned. General instructions  For the first 24 hours after the procedure: ? Do not drive or use machinery. ? Do not sign important documents. ? Do not drink alcohol. ? Do your daily activities more slowly than normal. ? Eat foods that are soft and easy to digest.  Take over-the-counter or prescription medicines only as told by your doctor.  Keep all follow-up visits as told by your doctor. This is important. Contact a doctor  if:  You have blood in your poop 2-3 days after the procedure. Get help right away if:  You have more than a small amount of blood in your poop.  You see large clumps of tissue (blood clots) in your poop.  Your belly is swollen.  You feel like you may vomit (nauseous).  You vomit.  You have a fever.  You have belly pain that gets worse, and medicine does not help your pain. Summary  After the procedure, it is common to have a small amount of blood in your poop. You may also have mild cramping and bloating in your belly.  For the first 24 hours after the procedure, do not drive or use machinery, do not sign important documents, and do not drink alcohol.  Get help right away if you have a lot of blood in your poop, feel like you may vomit, have a fever, or have more belly pain. This information is not intended to replace advice given to you by your health care provider. Make sure you discuss any questions you have with your health care provider. Document Revised: 08/04/2018 Document Reviewed: 08/04/2018 Elsevier Patient Education  2020 Elsevier Inc.  

## 2019-12-25 NOTE — Progress Notes (Signed)
Patient given PPD in left forearm at 1130.  Tracy Rose, [place]

## 2019-12-25 NOTE — Progress Notes (Signed)
    SUBJECTIVE:   CHIEF COMPLAINT / HPI:   Medical Assessment form completion for school. No concerns. She recently gained admission into [employer]  PMH / PSH: PMX reviewed  OBJECTIVE:   BP 120/80   Pulse 96   Ht 5\' 6"  (1.676 m)   Wt 267 lb 9.6 oz (121.4 kg)   LMP  (LMP Unknown)   SpO2 97%   BMI 43.19 kg/m   Physical Exam Vitals and nursing note reviewed.  Cardiovascular:     Rate and Rhythm: Normal rate and regular rhythm.     Heart sounds: Normal heart sounds. No murmur heard.   Pulmonary:     Effort: Pulmonary effort is normal. No respiratory distress.     Breath sounds: Normal breath sounds. No wheezing or rhonchi.  Abdominal:     General: Abdomen is flat. Bowel sounds are normal. There is no distension.     Palpations: Abdomen is soft. There is no mass.     Tenderness: There is no abdominal tenderness.  Musculoskeletal:     Right lower leg: No edema.     Left lower leg: No edema.      ASSESSMENT/PLAN:  Medical assessment for school in patient with hx of HTN and MDD: Stable off meds - self discontinuation of medication, but doing well. Recent well exam report reviewed. Form and immunization record reviewed and a copy of her vaccine record was given to her. Vision screen completed as requested. PPD placed which is required by her school.  She will return in 48-72 hours to read her report after which I will be able to complete her school form.    Andrena Mews, MD [place]

## 2019-12-28 ENCOUNTER — Ambulatory Visit: Payer: Medicaid Other

## 2019-12-28 ENCOUNTER — Other Ambulatory Visit: Payer: Self-pay

## 2019-12-28 DIAGNOSIS — Z111 Encounter for screening for respiratory tuberculosis: Secondary | ICD-10-CM

## 2019-12-28 LAB — TB SKIN TEST
Induration: 0 mm
TB Skin Test: NEGATIVE

## 2019-12-28 NOTE — Progress Notes (Addendum)
PPD Reading Note PPD read and results entered in [facility]. Result: 0 mm induration. Interpretation: Negative Allergic reaction: No  School form filled out by PCP with negative PPD result added.  Copy made for batch scanning.

## 2019-12-28 NOTE — Addendum Note (Signed)
Addended by: Leonia Corona R on: 12/28/2019 09:04 AM   Modules accepted: Orders

## 2020-01-11 ENCOUNTER — Ambulatory Visit (INDEPENDENT_AMBULATORY_CARE_PROVIDER_SITE_OTHER): Payer: Medicaid Other

## 2020-01-11 ENCOUNTER — Other Ambulatory Visit: Payer: Self-pay

## 2020-01-11 DIAGNOSIS — Z289 Immunization not carried out for unspecified reason: Secondary | ICD-10-CM

## 2020-01-11 DIAGNOSIS — Z23 Encounter for immunization: Secondary | ICD-10-CM | POA: Diagnosis present

## 2020-01-11 NOTE — Progress Notes (Signed)
Patient presents for Meningitis and Hepatitis B Vaccines for school.   Meningitis administered RD without complication.   Hepatitis B administered LD without complication.   Next dose is due 03/13/2020. This will need to be sent to her pharmacy in [place], [place].  I have added the out of state pharmacy to patients chart.

## 2020-01-29 ENCOUNTER — Other Ambulatory Visit: Payer: Self-pay

## 2020-01-29 ENCOUNTER — Ambulatory Visit (HOSPITAL_COMMUNITY)
Admission: RE | Admit: 2020-01-29 | Discharge: 2020-01-29 | Disposition: A | Discharge status: Home / Self Care | Payer: Medicaid Other | Source: Ambulatory Visit | Attending: Family Medicine | Admitting: Family Medicine

## 2020-01-29 NOTE — ED Provider Notes (Signed)
Patient needed a tdap for school. She had one updated 2019 as per her immunization records. No need for an OV.    Jaynee Eagles, PA-C 01/29/20 1009

## 2020-04-05 ENCOUNTER — Ambulatory Visit (INDEPENDENT_AMBULATORY_CARE_PROVIDER_SITE_OTHER): Payer: Medicaid Other

## 2020-04-05 ENCOUNTER — Other Ambulatory Visit: Payer: Self-pay

## 2020-04-05 ENCOUNTER — Ambulatory Visit: Payer: Medicaid Other

## 2020-04-05 DIAGNOSIS — Z23 Encounter for immunization: Secondary | ICD-10-CM | POA: Diagnosis present

## 2020-04-05 NOTE — Progress Notes (Signed)
Patient presents in nurse clinic for #2 Hepatitis B Vaccine.  Patient reports she did not get her #2 dose in [place], [place].  Patient is home for spring break and therefore would like it today.  Hepatitis B administered LD without complication.  Updated [facility] vaccine record given to patient.

## 2021-06-27 ENCOUNTER — Encounter: Payer: Self-pay | Admitting: *Deleted

## 2022-10-11 ENCOUNTER — Encounter: Payer: Self-pay | Admitting: Family Medicine
